# Patient Record
Sex: Female | Born: 1945 | Race: White | Hispanic: No | Marital: Single | Smoking: Former smoker
Health system: Southern US, Community
[De-identification: ages and names within clinical notes are randomized; demographics above are authoritative.]

## PROBLEM LIST (undated history)

## (undated) DIAGNOSIS — T4145XA Adverse effect of unspecified anesthetic, initial encounter: Secondary | ICD-10-CM

## (undated) DIAGNOSIS — K5790 Diverticulosis of intestine, part unspecified, without perforation or abscess without bleeding: Secondary | ICD-10-CM

## (undated) DIAGNOSIS — C541 Malignant neoplasm of endometrium: Secondary | ICD-10-CM

## (undated) DIAGNOSIS — K811 Chronic cholecystitis: Secondary | ICD-10-CM

## (undated) DIAGNOSIS — T8859XA Other complications of anesthesia, initial encounter: Secondary | ICD-10-CM

## (undated) DIAGNOSIS — R112 Nausea with vomiting, unspecified: Secondary | ICD-10-CM

## (undated) DIAGNOSIS — I639 Cerebral infarction, unspecified: Secondary | ICD-10-CM

## (undated) DIAGNOSIS — Z9289 Personal history of other medical treatment: Secondary | ICD-10-CM

## (undated) DIAGNOSIS — Z9889 Other specified postprocedural states: Secondary | ICD-10-CM

## (undated) HISTORY — PX: TUBAL LIGATION: SHX77

## (undated) HISTORY — PX: TONSILLECTOMY: SUR1361

---

## 1969-07-27 DIAGNOSIS — Z9289 Personal history of other medical treatment: Secondary | ICD-10-CM

## 1969-07-27 HISTORY — DX: Personal history of other medical treatment: Z92.89

## 1975-11-27 DIAGNOSIS — C541 Malignant neoplasm of endometrium: Secondary | ICD-10-CM

## 1975-11-27 HISTORY — DX: Malignant neoplasm of endometrium: C54.1

## 1975-11-27 HISTORY — PX: ABDOMINAL HYSTERECTOMY: SHX81

## 2012-02-26 DIAGNOSIS — E559 Vitamin D deficiency, unspecified: Secondary | ICD-10-CM | POA: Diagnosis not present

## 2012-03-17 DIAGNOSIS — Z1331 Encounter for screening for depression: Secondary | ICD-10-CM | POA: Diagnosis not present

## 2012-03-17 DIAGNOSIS — M810 Age-related osteoporosis without current pathological fracture: Secondary | ICD-10-CM | POA: Diagnosis not present

## 2012-03-17 DIAGNOSIS — E559 Vitamin D deficiency, unspecified: Secondary | ICD-10-CM | POA: Diagnosis not present

## 2012-04-10 DIAGNOSIS — L03119 Cellulitis of unspecified part of limb: Secondary | ICD-10-CM | POA: Diagnosis not present

## 2012-04-10 DIAGNOSIS — L02419 Cutaneous abscess of limb, unspecified: Secondary | ICD-10-CM | POA: Diagnosis not present

## 2012-04-16 DIAGNOSIS — L52 Erythema nodosum: Secondary | ICD-10-CM | POA: Diagnosis not present

## 2012-04-16 DIAGNOSIS — M810 Age-related osteoporosis without current pathological fracture: Secondary | ICD-10-CM | POA: Diagnosis not present

## 2012-06-09 DIAGNOSIS — E559 Vitamin D deficiency, unspecified: Secondary | ICD-10-CM | POA: Diagnosis not present

## 2012-07-03 DIAGNOSIS — L819 Disorder of pigmentation, unspecified: Secondary | ICD-10-CM | POA: Diagnosis not present

## 2012-07-21 DIAGNOSIS — R21 Rash and other nonspecific skin eruption: Secondary | ICD-10-CM | POA: Diagnosis not present

## 2012-11-06 DIAGNOSIS — Z0000 Encounter for general adult medical examination without abnormal findings: Secondary | ICD-10-CM | POA: Diagnosis not present

## 2012-11-06 DIAGNOSIS — N951 Menopausal and female climacteric states: Secondary | ICD-10-CM | POA: Diagnosis not present

## 2012-11-06 DIAGNOSIS — M810 Age-related osteoporosis without current pathological fracture: Secondary | ICD-10-CM | POA: Diagnosis not present

## 2012-11-06 DIAGNOSIS — Z1211 Encounter for screening for malignant neoplasm of colon: Secondary | ICD-10-CM | POA: Diagnosis not present

## 2012-11-06 DIAGNOSIS — F17200 Nicotine dependence, unspecified, uncomplicated: Secondary | ICD-10-CM | POA: Diagnosis not present

## 2012-11-07 DIAGNOSIS — Z23 Encounter for immunization: Secondary | ICD-10-CM | POA: Diagnosis not present

## 2012-12-19 DIAGNOSIS — Z1211 Encounter for screening for malignant neoplasm of colon: Secondary | ICD-10-CM | POA: Diagnosis not present

## 2013-02-10 ENCOUNTER — Encounter (HOSPITAL_BASED_OUTPATIENT_CLINIC_OR_DEPARTMENT_OTHER): Payer: Self-pay | Admitting: Family Medicine

## 2013-02-10 ENCOUNTER — Emergency Department (HOSPITAL_BASED_OUTPATIENT_CLINIC_OR_DEPARTMENT_OTHER): Payer: Medicare Other

## 2013-02-10 ENCOUNTER — Emergency Department (HOSPITAL_BASED_OUTPATIENT_CLINIC_OR_DEPARTMENT_OTHER)
Admission: EM | Admit: 2013-02-10 | Discharge: 2013-02-10 | Disposition: A | Discharge status: Home / Self Care | Payer: Medicare Other | Attending: Emergency Medicine | Admitting: Emergency Medicine

## 2013-02-10 DIAGNOSIS — W010XXA Fall on same level from slipping, tripping and stumbling without subsequent striking against object, initial encounter: Secondary | ICD-10-CM | POA: Insufficient documentation

## 2013-02-10 DIAGNOSIS — Y939 Activity, unspecified: Secondary | ICD-10-CM | POA: Insufficient documentation

## 2013-02-10 DIAGNOSIS — F17200 Nicotine dependence, unspecified, uncomplicated: Secondary | ICD-10-CM | POA: Diagnosis not present

## 2013-02-10 DIAGNOSIS — S52509A Unspecified fracture of the lower end of unspecified radius, initial encounter for closed fracture: Secondary | ICD-10-CM | POA: Diagnosis not present

## 2013-02-10 DIAGNOSIS — S52609A Unspecified fracture of lower end of unspecified ulna, initial encounter for closed fracture: Secondary | ICD-10-CM | POA: Diagnosis not present

## 2013-02-10 DIAGNOSIS — S62101A Fracture of unspecified carpal bone, right wrist, initial encounter for closed fracture: Secondary | ICD-10-CM

## 2013-02-10 DIAGNOSIS — Y9289 Other specified places as the place of occurrence of the external cause: Secondary | ICD-10-CM | POA: Insufficient documentation

## 2013-02-10 MED ORDER — HYDROCODONE-ACETAMINOPHEN 5-325 MG PO TABS: 1.0000 | ORAL_TABLET | Freq: Four times a day (QID) | ORAL | Status: DC | PRN

## 2013-02-10 NOTE — ED Provider Notes (Signed)
History     CSN: [id number]  Arrival date & time 02/10/13  1037   First MD Initiated Contact with Patient 02/10/13 1052      Chief Complaint  Patient presents with  . Fall    (Consider location/radiation/quality/duration/timing/severity/associated sxs/prior treatment) Patient is a 67 y.o. female presenting with fall.  Fall   Pt reports she slipped on ice this morning and fell onto her outstretched R hand. Complaining of moderate aching pain to R wrist. Worse with movement. Denies any head injury or LOC.  History reviewed. No pertinent past medical history.  Past Surgical History  Procedure Laterality Date  . Abdominal hysterectomy      No family history on file.  History  Substance Use Topics  . Smoking status: Current Every Day Smoker  . Smokeless tobacco: Not on file  . Alcohol Use: No    OB History   Grav Para Term Preterm Abortions TAB SAB Ect Mult Living                  Review of Systems All other systems reviewed and are negative except as noted in HPI.   Allergies  Penicillins  Home Medications   Current Outpatient Rx  Name  Route  Sig  Dispense  Refill  . ESTROGENS CONJUGATED PO   Oral   Take by mouth.           BP 136/72  Pulse 76  Temp(Src) 97.5 F (36.4 C) (Oral)  Resp 20  SpO2 97%  Physical Exam  Nursing note and vitals reviewed. Constitutional: She is oriented to person, place, and time. She appears well-developed and well-nourished.  HENT:  Head: Normocephalic and atraumatic.  Eyes: EOM are normal. Pupils are equal, round, and reactive to light.  Neck: Normal range of motion. Neck supple.  Cardiovascular: Normal rate, normal heart sounds and intact distal pulses.   Pulmonary/Chest: Effort normal and breath sounds normal.  Abdominal: Bowel sounds are normal. She exhibits no distension. There is no tenderness.  Musculoskeletal: She exhibits edema (R wrist) and tenderness (R wrist).  ROM decreased by pain, no deformity,  neurovascularly intact  Neurological: She is alert and oriented to person, place, and time. She has normal strength. No cranial nerve deficit or sensory deficit.  Skin: Skin is warm and dry. No rash noted.  Psychiatric: She has a normal mood and affect.    ED Course  Procedures (including critical care time)  Labs Reviewed - No data to display Dg Wrist Complete Right  02/10/2013  *RADIOLOGY REPORT*  Clinical Data: History of injury from fall with pain.  RIGHT WRIST - COMPLETE 3+ VIEW  Comparison: None.  Findings: There is a fracture of the base of the ulnar styloid. There is near apposition fracture site.  There is near anatomic alignment.  There is a slightly osteopenic appearance of bones. On the lateral image there appears to be a small fracture through the distal radial metaphysis involving the dorsal aspect.  No dislocation is seen.  There is degenerative spurring at the trapezium - first metacarpal joint.  IMPRESSION: Fracture of the base of the ulnar styloid. Fracture of the distal radial metaphysis.   Original Report Authenticated By: Onalee Hua Call      1. Wrist fracture, closed, right, initial encounter       MDM  Xray as above. Non-displaced. No neurovascular compromise. Will place in wrist splint, refer to Hand. Pain meds as needed.         Charles B.  Bernette Mayers, MD 02/10/13 1144

## 2013-02-10 NOTE — ED Notes (Signed)
Pt sts she fell due to slipping on ice this morning. Pt c/o right wrist pain and swelling, denies hitting head.

## 2013-02-11 NOTE — ED Notes (Signed)
Pt called sts she cannot get appt with ortho until Monday. Trisha Mangle, PA reviewed pt's chart and sts Monday is acceptable and continue ice, elevation and splint until then.

## 2013-02-16 DIAGNOSIS — S52509A Unspecified fracture of the lower end of unspecified radius, initial encounter for closed fracture: Secondary | ICD-10-CM | POA: Diagnosis not present

## 2013-02-20 DIAGNOSIS — S52599A Other fractures of lower end of unspecified radius, initial encounter for closed fracture: Secondary | ICD-10-CM | POA: Diagnosis not present

## 2013-03-17 DIAGNOSIS — IMO0001 Reserved for inherently not codable concepts without codable children: Secondary | ICD-10-CM | POA: Diagnosis not present

## 2013-04-13 DIAGNOSIS — S5290XD Unspecified fracture of unspecified forearm, subsequent encounter for closed fracture with routine healing: Secondary | ICD-10-CM | POA: Diagnosis not present

## 2013-04-13 DIAGNOSIS — S52599A Other fractures of lower end of unspecified radius, initial encounter for closed fracture: Secondary | ICD-10-CM | POA: Diagnosis not present

## 2013-05-11 DIAGNOSIS — IMO0001 Reserved for inherently not codable concepts without codable children: Secondary | ICD-10-CM | POA: Diagnosis not present

## 2013-05-11 DIAGNOSIS — S52599A Other fractures of lower end of unspecified radius, initial encounter for closed fracture: Secondary | ICD-10-CM | POA: Diagnosis not present

## 2013-05-11 DIAGNOSIS — S5290XD Unspecified fracture of unspecified forearm, subsequent encounter for closed fracture with routine healing: Secondary | ICD-10-CM | POA: Diagnosis not present

## 2013-06-04 DIAGNOSIS — R3915 Urgency of urination: Secondary | ICD-10-CM | POA: Diagnosis not present

## 2013-09-05 DIAGNOSIS — Z23 Encounter for immunization: Secondary | ICD-10-CM | POA: Diagnosis not present

## 2013-10-02 DIAGNOSIS — H269 Unspecified cataract: Secondary | ICD-10-CM | POA: Diagnosis not present

## 2013-10-02 DIAGNOSIS — H2510 Age-related nuclear cataract, unspecified eye: Secondary | ICD-10-CM | POA: Diagnosis not present

## 2014-10-06 DIAGNOSIS — H4011X3 Primary open-angle glaucoma, severe stage: Secondary | ICD-10-CM | POA: Diagnosis not present

## 2014-12-06 DIAGNOSIS — Z23 Encounter for immunization: Secondary | ICD-10-CM | POA: Diagnosis not present

## 2015-03-31 DIAGNOSIS — Z23 Encounter for immunization: Secondary | ICD-10-CM | POA: Diagnosis not present

## 2015-03-31 DIAGNOSIS — Z0000 Encounter for general adult medical examination without abnormal findings: Secondary | ICD-10-CM | POA: Diagnosis not present

## 2015-03-31 DIAGNOSIS — N951 Menopausal and female climacteric states: Secondary | ICD-10-CM | POA: Diagnosis not present

## 2015-03-31 DIAGNOSIS — E559 Vitamin D deficiency, unspecified: Secondary | ICD-10-CM | POA: Diagnosis not present

## 2015-03-31 DIAGNOSIS — J309 Allergic rhinitis, unspecified: Secondary | ICD-10-CM | POA: Diagnosis not present

## 2015-04-06 DIAGNOSIS — H4011X3 Primary open-angle glaucoma, severe stage: Secondary | ICD-10-CM | POA: Diagnosis not present

## 2015-04-28 ENCOUNTER — Other Ambulatory Visit: Payer: Self-pay | Admitting: Gastroenterology

## 2015-04-28 DIAGNOSIS — R195 Other fecal abnormalities: Secondary | ICD-10-CM | POA: Diagnosis not present

## 2015-04-28 DIAGNOSIS — R1011 Right upper quadrant pain: Secondary | ICD-10-CM | POA: Diagnosis not present

## 2015-05-11 ENCOUNTER — Ambulatory Visit
Admission: RE | Admit: 2015-05-11 | Discharge: 2015-05-11 | Disposition: A | Discharge status: Home / Self Care | Payer: Medicare Other | Source: Ambulatory Visit | Attending: Gastroenterology | Admitting: Gastroenterology

## 2015-05-11 DIAGNOSIS — K828 Other specified diseases of gallbladder: Secondary | ICD-10-CM | POA: Diagnosis not present

## 2015-05-11 DIAGNOSIS — R1011 Right upper quadrant pain: Secondary | ICD-10-CM

## 2015-05-16 DIAGNOSIS — K648 Other hemorrhoids: Secondary | ICD-10-CM | POA: Diagnosis not present

## 2015-05-16 DIAGNOSIS — K644 Residual hemorrhoidal skin tags: Secondary | ICD-10-CM | POA: Diagnosis not present

## 2015-05-16 DIAGNOSIS — K5730 Diverticulosis of large intestine without perforation or abscess without bleeding: Secondary | ICD-10-CM | POA: Diagnosis not present

## 2015-05-16 DIAGNOSIS — D126 Benign neoplasm of colon, unspecified: Secondary | ICD-10-CM | POA: Diagnosis not present

## 2015-05-16 DIAGNOSIS — D122 Benign neoplasm of ascending colon: Secondary | ICD-10-CM | POA: Diagnosis not present

## 2015-05-16 DIAGNOSIS — R195 Other fecal abnormalities: Secondary | ICD-10-CM | POA: Diagnosis not present

## 2015-05-31 ENCOUNTER — Other Ambulatory Visit: Payer: Self-pay | Admitting: Surgery

## 2015-05-31 DIAGNOSIS — Z720 Tobacco use: Secondary | ICD-10-CM | POA: Diagnosis not present

## 2015-05-31 DIAGNOSIS — K811 Chronic cholecystitis: Secondary | ICD-10-CM | POA: Diagnosis not present

## 2015-05-31 NOTE — H&P (Signed)
[name] 05/31/2015 3:40 PM Location: [facility] Patient #: [id number] DOB: Oct 29, 1946 Divorced / Language: [name] / Race: White Female History of Present Illness [name] MD; 05/31/2015 4:56 PM) Patient words: gallbladder.  The patient is a 69 year old female who presents for evaluation of gall stones. Patient sent for surgical consultation by Dr. Arta Silence with [facility] gastroenterology. Concern for gallbladder symptoms. Pleasant active but smoking female. Has mild indigestion/reflux. Usually controlled with as needed Zantac. Had an episode of very sharp RIGHT upper quadrant abdominal pain. Relocated to her back. Had nausea and vomiting with it. This happened a month ago. She's had milder such symptoms since then. Does not seem consistent with her heartburn or reflux. He cannot recall the food trigger been she did eat an hour to beforehand. Didn't late at night. No sick contacts or travel history. No history of colitis or inflammatory bowel disease. Tells me she had a screening colonoscopy also for some guaiac issues. That just showed diverticulosis. I do not have the records. Because of the more severe attack discussed with her primary care physician. Blood in stool noted. Gastroenterology was concerned for biliary colic. Ultrasound did show sludge. Liver function tests otherwise normal. Surgical consultation requested. She does not drink alcohol. She does smoke about a pack a day. No history of cirrhosis or pancreatitis. No history of stomach ulcers or gastritis. No hematemesis.   CLINICAL DATA: Right upper quadrant abdominal pain EXAM: ULTRASOUND ABDOMEN COMPLETE COMPARISON: None. FINDINGS: Gallbladder: Layering gallbladder sludge. No gallstones or gallbladder wall thickening. Negative sonographic Murphy's sign. Common bile duct: Diameter: 6 mm Liver: No focal lesion identified. Within normal limits in parenchymal echogenicity. IVC: No  abnormality visualized. Pancreas: Visualized portion unremarkable. Spleen: Size and appearance within normal limits. Right Kidney: Length: 11.1 cm. No mass or hydronephrosis. Left Kidney: Length: 11.9 cm. No mass or hydronephrosis. Abdominal aorta: No aneurysm visualized. Other findings: None. IMPRESSION: Layering gallbladder sludge. No associated findings to suggest acute cholecystitis. Electronically Signed By: [name] On: 05/11/2015 08:39  Other Problems [name], [place]; 05/31/2015 3:40 PM) Cervical Cancer Hemorrhoids  Past Surgical History [name], CMA; 05/31/2015 3:40 PM) Colon Polyp Removal - Colonoscopy Hysterectomy (due to cancer) - Partial Tonsillectomy  Diagnostic Studies History [name], CMA; 05/31/2015 3:40 PM) Colonoscopy within last year Mammogram never Pap Smear 1-5 years ago  Allergies [name], CMA; 05/31/2015 3:42 PM) Penicillamine *ASSORTED CLASSES*  Medication History [name], CMA; 05/31/2015 3:43 PM) Estradiol (0.5MG  Tablet, Oral) Active. Fluticasone Propionate (50MCG/ACT Suspension, Nasal) Active. Vitamin D (Cholecalciferol) (1000UNIT Tablet, Oral) Active. Flonase Allergy Relief (50MCG/ACT Suspension, Nasal) Active. Medications Reconciled  Social History [name], CMA; 05/31/2015 3:40 PM) Caffeine use Coffee, Tea. No alcohol use No drug use Tobacco use Current every day smoker.  Family History [name], [place]; 05/31/2015 3:40 PM) Breast Cancer Daughter.  Pregnancy / Birth History [name], [name]; 05/31/2015 3:40 PM) Age at menarche 44 years. Age of menopause <43 Contraceptive History Oral contraceptives. Gravida 4 Maternal age 61-25 Para 3     Review of Systems ([place]; 05/31/2015 3:40 PM) General Not Present- Appetite Loss, Chills, Fatigue, Fever, Night Sweats, Weight Gain and Weight Loss. Skin Not Present- Change in Wart/Mole, Dryness, Hives, Jaundice, New  Lesions, Non-Healing Wounds, Rash and Ulcer. HEENT Present- Seasonal Allergies. Not Present- Earache, Hearing Loss, Hoarseness, Nose Bleed, Oral Ulcers, Ringing in the Ears, Sinus Pain, Sore Throat, Visual Disturbances, Wears glasses/contact lenses and Yellow Eyes. Respiratory Not Present- Bloody sputum, Chronic  Cough, Difficulty Breathing, Snoring and Wheezing. Breast Not Present- Breast Mass, Breast Pain, Nipple Discharge and Skin Changes. Cardiovascular Not Present- Chest Pain, Difficulty Breathing Lying Down, Leg Cramps, Palpitations, Rapid Heart Rate, Shortness of Breath and Swelling of Extremities. Gastrointestinal Present- Gets full quickly at meals. Not Present- Abdominal Pain, Bloating, Bloody Stool, Change in Bowel Habits, Chronic diarrhea, Constipation, Difficulty Swallowing, Excessive gas, Hemorrhoids, Indigestion, Nausea, Rectal Pain and Vomiting. Female Genitourinary Not Present- Frequency, Nocturia, Painful Urination, Pelvic Pain and Urgency. Musculoskeletal Not Present- Back Pain, Joint Pain, Joint Stiffness, Muscle Pain, Muscle Weakness and Swelling of Extremities. Neurological Not Present- Decreased Memory, Fainting, Headaches, Numbness, Seizures, Tingling, Tremor, Trouble walking and Weakness. Psychiatric Not Present- Anxiety, Bipolar, Change in Sleep Pattern, Depression, Fearful and Frequent crying. Endocrine Present- Hot flashes. Not Present- Cold Intolerance, Excessive Hunger, Hair Changes, Heat Intolerance and New Diabetes. Hematology Not Present- Easy Bruising, Excessive bleeding, Gland problems, HIV and Persistent Infections.  Vitals (Sonya Randall CMA; 05/31/2015 3:41 PM) 05/31/2015 3:40 PM Weight: 142.8 lb Height: 62in Body Surface Area: 1.68 m Body Mass Index: 26.12 kg/m Temp.: 97.40F(Temporal)  Pulse: 78 (Regular)  BP: 130/70 (Sitting, Left Arm, Standard)     Physical Exam [name] MD; 05/31/2015 4:39 PM)  General Mental Status-Alert. General  Appearance-Not in acute distress, Not Sickly. Orientation-Oriented X3. Hydration-Well hydrated. Voice-Normal.  Integumentary Global Assessment Upon inspection and palpation of skin surfaces of the - Axillae: non-tender, no inflammation or ulceration, no drainage. and Distribution of scalp and body hair is normal. General Characteristics Temperature - normal warmth is noted.  Head and Neck Head-normocephalic, atraumatic with no lesions or palpable masses. Face Global Assessment - atraumatic, no absence of expression. Neck Global Assessment - no abnormal movements, no bruit auscultated on the right, no bruit auscultated on the left, no decreased range of motion, non-tender. Trachea-midline. Thyroid Gland Characteristics - non-tender.  Eye Eyeball - Left-Extraocular movements intact, No Nystagmus. Eyeball - Right-Extraocular movements intact, No Nystagmus. Cornea - Left-No Hazy. Cornea - Right-No Hazy. Sclera/Conjunctiva - Left-No scleral icterus, No Discharge. Sclera/Conjunctiva - Right-No scleral icterus, No Discharge. Pupil - Left-Direct reaction to light normal. Pupil - Right-Direct reaction to light normal.  ENMT Ears Pinna - Left - no drainage observed, no generalized tenderness observed. Right - no drainage observed, no generalized tenderness observed. Nose and Sinuses External Inspection of the Nose - no destructive lesion observed. Inspection of the nares - Left - quiet respiration. Right - quiet respiration. Mouth and Throat Lips - Upper Lip - no fissures observed, no pallor noted. Lower Lip - no fissures observed, no pallor noted. Nasopharynx - no discharge present. Oral Cavity/Oropharynx - Tongue - no dryness observed. Oral Mucosa - no cyanosis observed. Hypopharynx - no evidence of airway distress observed.  Chest and Lung Exam Inspection Movements - Normal and Symmetrical. Accessory muscles - No use of accessory muscles in  breathing. Palpation Palpation of the chest reveals - Non-tender. Auscultation Breath sounds - Normal and Clear.  Cardiovascular Auscultation Rhythm - Regular. Murmurs & Other Heart Sounds - Auscultation of the heart reveals - No Murmurs and No Systolic Clicks.  Abdomen Inspection Inspection of the abdomen reveals - No Visible peristalsis and No Abnormal pulsations. Umbilicus - No Bleeding, No Urine drainage. Palpation/Percussion Palpation and Percussion of the abdomen reveal - Soft, Non Tender, No Rebound tenderness, No Rigidity (guarding) and No Cutaneous hyperesthesia. Note: Abdomen soft. Mild discomfort in RIGHT upper quadrant but no true Murphy sign.   Female Genitourinary Sexual Maturity Tanner 5 -  Adult hair pattern. Note: No vaginal bleeding nor discharge   Peripheral Vascular Upper Extremity Inspection - Left - No Cyanotic nailbeds, Not Ischemic. Right - No Cyanotic nailbeds, Not Ischemic.  Neurologic Neurologic evaluation reveals -normal attention span and ability to concentrate, able to name objects and repeat phrases. Appropriate fund of knowledge , normal sensation and normal coordination. Mental Status Affect - not angry, not paranoid. Cranial Nerves-Normal Bilaterally. Gait-Normal.  Neuropsychiatric Mental status exam performed with findings of-able to articulate well with normal speech/language, rate, volume and coherence, thought content normal with ability to perform basic computations and apply abstract reasoning and no evidence of hallucinations, delusions, obsessions or homicidal/suicidal ideation.  Musculoskeletal Global Assessment Spine, Ribs and Pelvis - no instability, subluxation or laxity. Right Upper Extremity - no instability, subluxation or laxity.  Lymphatic Head & Neck  General Head & Neck Lymphatics: Bilateral - Description - No Localized lymphadenopathy. Axillary  General Axillary Region: Bilateral - Description - No  Localized lymphadenopathy. Femoral & Inguinal  Generalized Femoral & Inguinal Lymphatics: Left - Description - No Localized lymphadenopathy. Right - Description - No Localized lymphadenopathy.    Assessment & Plan [name] MD; 05/31/2015 4:54 PM)  CHRONIC CHOLECYSTITIS (575.11  K81.1) Impression: Postprandial RIGHT upper quadrant radiating to the back pain with nausea and vomiting strongly suspicious for biliary colic. Ultrasound with at least sludge. Rest of the differential diagnosis unlikely.   She would benefit from cholecystectomy. Reasonable laparoscopic single site approach. He has a risk that this does not solve her problems is incision and etiology. She wishes to proceed with surgery first.  Current Plans Schedule for Surgery Written instructions provided Pt Education - Pamphlet Given - Laparoscopic Gallbladder Surgery: discussed with patient and provided information. The anatomy & physiology of hepatobiliary & pancreatic function was discussed. The pathophysiology of gallbladder dysfunction was discussed. Natural history risks without surgery was discussed. I feel the risks of no intervention will lead to serious problems that outweigh the operative risks; therefore, I recommended cholecystectomy to remove the pathology. I explained laparoscopic techniques with possible need for an open approach. Probable cholangiogram to evaluate the bilary tract was explained as well.  Risks such as bleeding, infection, abscess, leak, injury to other organs, need for further treatment, heart attack, death, and other risks were discussed. I noted a good likelihood this will help address the problem. Possibility that this will not correct all abdominal symptoms was explained. Goals of post-operative recovery were discussed as well. We will work to minimize complications. An educational handout further explaining the pathology and treatment options was given as well. Questions were answered. The  patient expresses understanding & wishes to proceed with surgery. Pt Education - [facility] Laparosopic Post Op HCI (Genevive Printup) Pt Education - [facility] Good Bowel Health (Kayleen Alig) Pt Education - [facility] Pain Control (Anadalay Macdonell) TOBACCO ABUSE (305.1  Z72.0) Impression: STOP SMOKING!!  STOP SMOKING! We talked to the patient about the dangers of smoking. We stressed that tobacco use dramatically increases the risk of peri-operative complications such as infection, tissue necrosis leaving to problems with incision/wound and organ healing, hernia, chronic pain, heart attack, stroke, DVT, pulmonary embolism, and death. We noted there are programs in our community to help stop smoking. Information was available.  Current Plans Pt Education - [facility] Free Text Education/Instructions: discussed with patient and provided information.  [name], M.D., F.A.C.S. Gastrointestinal and Minimally Invasive Surgery [facility] [address] [place], [place] [address] [phone] Main / Paging

## 2015-07-13 NOTE — Patient Instructions (Addendum)
YOUR PROCEDURE IS SCHEDULED ON : 07/19/15  REPORT TO [facility] MAIN ENTRANCE FOLLOW SIGNS TO EAST ELEVATOR - GO TO 3rd FLOOR CHECK IN AT 3 EAST NURSES STATION (SHORT STAY) AT: 7:30 AM  CALL THIS NUMBER IF YOU HAVE PROBLEMS THE MORNING OF SURGERY [phone]  REMEMBER:ONLY 1 PER PERSON MAY GO TO SHORT STAY WITH YOU TO GET READY THE MORNING OF YOUR SURGERY  DO NOT EAT FOOD OR DRINK LIQUIDS AFTER MIDNIGHT  TAKE THESE MEDICINES THE MORNING OF SURGERY: none  STOP ASPIRIN / IBUPROFEN / ALEVE / VITAMINS / HERBAL MEDS __5__ DAYS BEFORE SURGERY  YOU MAY NOT HAVE ANY METAL ON YOUR BODY INCLUDING HAIR PINS AND PIERCING'S. DO NOT WEAR JEWELRY, MAKEUP, LOTIONS, POWDERS OR PERFUMES. DO NOT WEAR NAIL POLISH. DO NOT SHAVE 48 HRS PRIOR TO SURGERY. MEN MAY SHAVE FACE AND NECK.  DO NOT [place]. [facility] IS NOT RESPONSIBLE FOR VALUABLES.  CONTACTS, DENTURES OR PARTIALS MAY NOT BE WORN TO SURGERY. LEAVE SUITCASE IN CAR. CAN BE BROUGHT TO ROOM AFTER SURGERY.  PATIENTS DISCHARGED THE DAY OF SURGERY WILL NOT BE ALLOWED TO DRIVE HOME.  PLEASE READ OVER THE FOLLOWING INSTRUCTION SHEETS _________________________________________________________________________________                                          [facility] - PREPARING FOR SURGERY  Before surgery, you can play an important role.  Because skin is not sterile, your skin needs to be as free of germs as possible.  You can reduce the number of germs on your skin by washing with CHG (chlorahexidine gluconate) soap before surgery.  CHG is an antiseptic cleaner which kills germs and bonds with the skin to continue killing germs even after washing. Please DO NOT use if you have an allergy to CHG or antibacterial soaps.  If your skin becomes reddened/irritated stop using the CHG and inform your nurse when you arrive at Short Stay. Do not shave (including legs and underarms) for at least 48 hours prior to the first  CHG shower.  You may shave your face. Please follow these instructions carefully:   1.  Shower with CHG Soap the night before surgery and the  morning of Surgery.   2.  If you choose to wash your hair, wash your hair first as usual with your  normal  Shampoo.   3.  After you shampoo, rinse your hair and body thoroughly to remove the  shampoo.                                         4.  Use CHG as you would any other liquid soap.  You can apply chg directly  to the skin and wash . Gently wash with scrungie or clean wascloth    5.  Apply the CHG Soap to your body ONLY FROM THE NECK DOWN.   Do not use on open                           Wound or open sores. Avoid contact with eyes, ears mouth and genitals (private parts).  Genitals (private parts) with your normal soap.              6.  Wash thoroughly, paying special attention to the area where your surgery  will be performed.   7.  Thoroughly rinse your body with warm water from the neck down.   8.  DO NOT shower/wash with your normal soap after using and rinsing off  the CHG Soap .                9.  Pat yourself dry with a clean towel.             10.  Wear clean night clothes to bed after shower             11.  Place clean sheets on your bed the night of your first shower and do not  sleep with pets.  Day of Surgery : Do not apply any lotions/deodorants the morning of surgery.  Please wear clean clothes to the hospital/surgery center.  FAILURE TO FOLLOW THESE INSTRUCTIONS MAY RESULT IN THE CANCELLATION OF YOUR SURGERY    PATIENT SIGNATURE_________________________________  ______________________________________________________________________

## 2015-07-14 ENCOUNTER — Encounter (HOSPITAL_COMMUNITY): Payer: Self-pay

## 2015-07-14 ENCOUNTER — Encounter (HOSPITAL_COMMUNITY)
Admission: RE | Admit: 2015-07-14 | Discharge: 2015-07-14 | Disposition: A | Discharge status: Home / Self Care | Payer: Medicare Other | Source: Ambulatory Visit | Attending: Surgery | Admitting: Surgery

## 2015-07-14 DIAGNOSIS — Z01818 Encounter for other preprocedural examination: Secondary | ICD-10-CM | POA: Insufficient documentation

## 2015-07-14 DIAGNOSIS — K8050 Calculus of bile duct without cholangitis or cholecystitis without obstruction: Secondary | ICD-10-CM | POA: Diagnosis not present

## 2015-07-14 HISTORY — DX: Adverse effect of unspecified anesthetic, initial encounter: T41.45XA

## 2015-07-14 HISTORY — DX: Nausea with vomiting, unspecified: R11.2

## 2015-07-14 HISTORY — DX: Other specified postprocedural states: Z98.890

## 2015-07-14 HISTORY — DX: Chronic cholecystitis: K81.1

## 2015-07-14 HISTORY — DX: Personal history of other medical treatment: Z92.89

## 2015-07-14 HISTORY — DX: Other complications of anesthesia, initial encounter: T88.59XA

## 2015-07-14 LAB — CBC
HEMATOCRIT: 38 % (ref 36.0–46.0)
Hemoglobin: 12.6 g/dL (ref 12.0–15.0)
MCH: 30.1 pg (ref 26.0–34.0)
MCHC: 33.2 g/dL (ref 30.0–36.0)
MCV: 90.7 fL (ref 78.0–100.0)
PLATELETS: 244 10*3/uL (ref 150–400)
RBC: 4.19 MIL/uL (ref 3.87–5.11)
RDW: 13.1 % (ref 11.5–15.5)
WBC: 5.5 10*3/uL (ref 4.0–10.5)

## 2015-07-19 ENCOUNTER — Ambulatory Visit (HOSPITAL_COMMUNITY): Payer: Medicare Other | Admitting: Anesthesiology

## 2015-07-19 ENCOUNTER — Ambulatory Visit (HOSPITAL_COMMUNITY)
Admission: RE | Admit: 2015-07-19 | Discharge: 2015-07-19 | Disposition: A | Discharge status: Home / Self Care | Payer: Medicare Other | Source: Ambulatory Visit | Attending: Surgery | Admitting: Surgery

## 2015-07-19 ENCOUNTER — Encounter (HOSPITAL_COMMUNITY): Payer: Self-pay | Admitting: *Deleted

## 2015-07-19 ENCOUNTER — Encounter (HOSPITAL_COMMUNITY)
Admission: RE | Disposition: A | Discharge status: Home / Self Care | Payer: Self-pay | Source: Ambulatory Visit | Attending: Surgery

## 2015-07-19 ENCOUNTER — Ambulatory Visit (HOSPITAL_COMMUNITY): Payer: Medicare Other

## 2015-07-19 DIAGNOSIS — F17200 Nicotine dependence, unspecified, uncomplicated: Secondary | ICD-10-CM | POA: Diagnosis not present

## 2015-07-19 DIAGNOSIS — K811 Chronic cholecystitis: Secondary | ICD-10-CM | POA: Diagnosis not present

## 2015-07-19 DIAGNOSIS — Z7951 Long term (current) use of inhaled steroids: Secondary | ICD-10-CM | POA: Insufficient documentation

## 2015-07-19 DIAGNOSIS — K921 Melena: Secondary | ICD-10-CM | POA: Insufficient documentation

## 2015-07-19 DIAGNOSIS — N951 Menopausal and female climacteric states: Secondary | ICD-10-CM | POA: Diagnosis not present

## 2015-07-19 DIAGNOSIS — C539 Malignant neoplasm of cervix uteri, unspecified: Secondary | ICD-10-CM | POA: Insufficient documentation

## 2015-07-19 DIAGNOSIS — Z79899 Other long term (current) drug therapy: Secondary | ICD-10-CM | POA: Insufficient documentation

## 2015-07-19 DIAGNOSIS — K819 Cholecystitis, unspecified: Secondary | ICD-10-CM | POA: Diagnosis not present

## 2015-07-19 DIAGNOSIS — Z79890 Hormone replacement therapy (postmenopausal): Secondary | ICD-10-CM | POA: Diagnosis not present

## 2015-07-19 DIAGNOSIS — K8050 Calculus of bile duct without cholangitis or cholecystitis without obstruction: Secondary | ICD-10-CM | POA: Diagnosis not present

## 2015-07-19 DIAGNOSIS — Z419 Encounter for procedure for purposes other than remedying health state, unspecified: Secondary | ICD-10-CM

## 2015-07-19 DIAGNOSIS — R112 Nausea with vomiting, unspecified: Secondary | ICD-10-CM | POA: Diagnosis present

## 2015-07-19 HISTORY — PX: LAPAROSCOPIC CHOLECYSTECTOMY SINGLE SITE WITH INTRAOPERATIVE CHOLANGIOGRAM: SHX6538

## 2015-07-19 HISTORY — DX: Malignant neoplasm of endometrium: C54.1

## 2015-07-19 SURGERY — LAPAROSCOPIC CHOLECYSTECTOMY SINGLE SITE WITH INTRAOPERATIVE CHOLANGIOGRAM
Anesthesia: General | Site: Abdomen

## 2015-07-19 MED ORDER — KETOROLAC TROMETHAMINE 30 MG/ML IJ SOLN
INTRAMUSCULAR | Status: AC
  Filled 2015-07-19: qty 1

## 2015-07-19 MED ORDER — PROPOFOL 10 MG/ML IV BOLUS
INTRAVENOUS | Status: AC
  Filled 2015-07-19: qty 20

## 2015-07-19 MED ORDER — MIDAZOLAM HCL 5 MG/5ML IJ SOLN
INTRAMUSCULAR | Status: DC | PRN
Start: 2015-07-19 — End: 2015-07-19
  Administered 2015-07-19: 2 mg via INTRAVENOUS

## 2015-07-19 MED ORDER — FENTANYL CITRATE (PF) 100 MCG/2ML IJ SOLN
25.0000 ug | INTRAMUSCULAR | Status: DC | PRN
  Administered 2015-07-19 (×2): 50 ug via INTRAVENOUS

## 2015-07-19 MED ORDER — NEOSTIGMINE METHYLSULFATE 10 MG/10ML IV SOLN
INTRAVENOUS | Status: DC | PRN
  Administered 2015-07-19: 4 mg via INTRAVENOUS

## 2015-07-19 MED ORDER — SUCCINYLCHOLINE CHLORIDE 20 MG/ML IJ SOLN
INTRAMUSCULAR | Status: DC | PRN
  Administered 2015-07-19: 100 mg via INTRAVENOUS

## 2015-07-19 MED ORDER — LACTATED RINGERS IR SOLN
Status: DC | PRN
  Administered 2015-07-19: 2000 mL

## 2015-07-19 MED ORDER — IOHEXOL 300 MG/ML  SOLN
INTRAMUSCULAR | Status: DC | PRN
  Administered 2015-07-19: 12 mL

## 2015-07-19 MED ORDER — LIDOCAINE HCL (CARDIAC) 20 MG/ML IV SOLN
INTRAVENOUS | Status: AC
  Filled 2015-07-19: qty 5

## 2015-07-19 MED ORDER — GLYCOPYRROLATE 0.2 MG/ML IJ SOLN
INTRAMUSCULAR | Status: AC
  Filled 2015-07-19: qty 3

## 2015-07-19 MED ORDER — BUPIVACAINE-EPINEPHRINE 0.25% -1:200000 IJ SOLN
INTRAMUSCULAR | Status: DC | PRN
  Administered 2015-07-19: 50 mL

## 2015-07-19 MED ORDER — PROPOFOL 10 MG/ML IV BOLUS
INTRAVENOUS | Status: DC | PRN
  Administered 2015-07-19: 100 mg via INTRAVENOUS

## 2015-07-19 MED ORDER — ONDANSETRON HCL 4 MG/2ML IJ SOLN
INTRAMUSCULAR | Status: DC | PRN
  Administered 2015-07-19: 4 mg via INTRAVENOUS

## 2015-07-19 MED ORDER — FENTANYL CITRATE (PF) 250 MCG/5ML IJ SOLN
INTRAMUSCULAR | Status: AC
  Filled 2015-07-19: qty 25

## 2015-07-19 MED ORDER — CHLORHEXIDINE GLUCONATE 4 % EX LIQD: 1.0000 "application " | Freq: Once | CUTANEOUS | Status: DC

## 2015-07-19 MED ORDER — HYDROMORPHONE HCL 1 MG/ML IJ SOLN
0.2500 mg | INTRAMUSCULAR | Status: DC | PRN
  Administered 2015-07-19: 0.5 mg via INTRAVENOUS

## 2015-07-19 MED ORDER — BUPIVACAINE-EPINEPHRINE 0.25% -1:200000 IJ SOLN
INTRAMUSCULAR | Status: AC
  Filled 2015-07-19: qty 1

## 2015-07-19 MED ORDER — KETOROLAC TROMETHAMINE 30 MG/ML IJ SOLN
INTRAMUSCULAR | Status: DC | PRN
  Administered 2015-07-19: 30 mg via INTRAVENOUS

## 2015-07-19 MED ORDER — BUPIVACAINE-EPINEPHRINE (PF) 0.25% -1:200000 IJ SOLN
INTRAMUSCULAR | Status: AC
  Filled 2015-07-19: qty 30

## 2015-07-19 MED ORDER — HYDROMORPHONE HCL 1 MG/ML IJ SOLN
INTRAMUSCULAR | Status: AC
  Filled 2015-07-19: qty 1

## 2015-07-19 MED ORDER — ROCURONIUM BROMIDE 100 MG/10ML IV SOLN
INTRAVENOUS | Status: AC
  Filled 2015-07-19: qty 1

## 2015-07-19 MED ORDER — LIDOCAINE HCL (CARDIAC) 20 MG/ML IV SOLN
INTRAVENOUS | Status: DC | PRN
Start: 2015-07-19 — End: 2015-07-19
  Administered 2015-07-19: 50 mg via INTRAVENOUS

## 2015-07-19 MED ORDER — FENTANYL CITRATE (PF) 100 MCG/2ML IJ SOLN
INTRAMUSCULAR | Status: AC
  Filled 2015-07-19: qty 2

## 2015-07-19 MED ORDER — GLYCOPYRROLATE 0.2 MG/ML IJ SOLN
INTRAMUSCULAR | Status: DC | PRN
  Administered 2015-07-19: .6 mg via INTRAVENOUS

## 2015-07-19 MED ORDER — ONDANSETRON HCL 4 MG/2ML IJ SOLN: 4.0000 mg | Freq: Once | INTRAMUSCULAR | Status: DC | PRN

## 2015-07-19 MED ORDER — OXYCODONE HCL 5 MG PO TABS: 5.0000 mg | ORAL_TABLET | ORAL | Status: DC | PRN

## 2015-07-19 MED ORDER — OXYCODONE HCL 5 MG PO TABS
5.0000 mg | ORAL_TABLET | ORAL | Status: DC | PRN
  Administered 2015-07-19: 5 mg via ORAL
  Filled 2015-07-19: qty 1

## 2015-07-19 MED ORDER — ROCURONIUM BROMIDE 100 MG/10ML IV SOLN
INTRAVENOUS | Status: DC | PRN
  Administered 2015-07-19: 20 mg via INTRAVENOUS

## 2015-07-19 MED ORDER — MIDAZOLAM HCL 2 MG/2ML IJ SOLN
INTRAMUSCULAR | Status: AC
  Filled 2015-07-19: qty 2

## 2015-07-19 MED ORDER — FENTANYL CITRATE (PF) 100 MCG/2ML IJ SOLN
INTRAMUSCULAR | Status: DC | PRN
  Administered 2015-07-19: 50 ug via INTRAVENOUS

## 2015-07-19 MED ORDER — NEOSTIGMINE METHYLSULFATE 10 MG/10ML IV SOLN
INTRAVENOUS | Status: AC
Start: 2015-07-19 — End: 2015-07-19
  Filled 2015-07-19: qty 1

## 2015-07-19 MED ORDER — LACTATED RINGERS IV SOLN
INTRAVENOUS | Status: DC
  Administered 2015-07-19: 10:00:00 via INTRAVENOUS
  Administered 2015-07-19: 1000 mL via INTRAVENOUS

## 2015-07-19 MED ORDER — MIDAZOLAM HCL 2 MG/2ML IJ SOLN
INTRAMUSCULAR | Status: AC
  Filled 2015-07-19: qty 4

## 2015-07-19 SURGICAL SUPPLY — 37 items
APPLIER CLIP 5 13 M/L LIGAMAX5 (MISCELLANEOUS) ×3
CABLE HIGH FREQUENCY MONO STRZ (ELECTRODE) ×3 IMPLANT
CHLORAPREP W/TINT 26ML (MISCELLANEOUS) ×3 IMPLANT
CLIP APPLIE 5 13 M/L LIGAMAX5 (MISCELLANEOUS) ×1 IMPLANT
COVER MAYO STAND STRL (DRAPES) ×3 IMPLANT
COVER SURGICAL LIGHT HANDLE (MISCELLANEOUS) ×3 IMPLANT
DECANTER SPIKE VIAL GLASS SM (MISCELLANEOUS) ×3 IMPLANT
DRAIN CHANNEL 19F RND (DRAIN) IMPLANT
DRAPE C-ARM 42X120 X-RAY (DRAPES) ×3 IMPLANT
DRAPE LAPAROSCOPIC ABDOMINAL (DRAPES) ×3 IMPLANT
DRAPE WARM FLUID 44X44 (DRAPE) ×3 IMPLANT
DRSG TEGADERM 4X4.75 (GAUZE/BANDAGES/DRESSINGS) ×3 IMPLANT
ELECT REM PT RETURN 9FT ADLT (ELECTROSURGICAL) ×3
ELECTRODE REM PT RTRN 9FT ADLT (ELECTROSURGICAL) ×1 IMPLANT
ENDOLOOP SUT PDS II  0 18 (SUTURE)
ENDOLOOP SUT PDS II 0 18 (SUTURE) IMPLANT
EVACUATOR SILICONE 100CC (DRAIN) IMPLANT
GAUZE SPONGE 2X2 8PLY STRL LF (GAUZE/BANDAGES/DRESSINGS) ×1 IMPLANT
GLOVE ECLIPSE 8.0 STRL XLNG CF (GLOVE) ×3 IMPLANT
GLOVE INDICATOR 8.0 STRL GRN (GLOVE) ×3 IMPLANT
GOWN STRL REUS W/TWL XL LVL3 (GOWN DISPOSABLE) ×6 IMPLANT
KIT BASIN OR (CUSTOM PROCEDURE TRAY) ×3 IMPLANT
PEN SKIN MARKING BROAD (MISCELLANEOUS) ×3 IMPLANT
POUCH SPECIMEN RETRIEVAL 10MM (ENDOMECHANICALS) ×3 IMPLANT
SCISSORS LAP 5X35 DISP (ENDOMECHANICALS) IMPLANT
SET CHOLANGIOGRAPH MIX (MISCELLANEOUS) ×3 IMPLANT
SET IRRIG TUBING LAPAROSCOPIC (IRRIGATION / IRRIGATOR) ×3 IMPLANT
SHEARS HARMONIC ACE PLUS 36CM (ENDOMECHANICALS) ×3 IMPLANT
SPONGE GAUZE 2X2 STER 10/PKG (GAUZE/BANDAGES/DRESSINGS) ×2
SUT MNCRL AB 4-0 PS2 18 (SUTURE) ×3 IMPLANT
SUT PDS AB 1 CT1 27 (SUTURE) ×6 IMPLANT
SYR 20CC LL (SYRINGE) ×3 IMPLANT
TOWEL OR 17X26 10 PK STRL BLUE (TOWEL DISPOSABLE) ×3 IMPLANT
TOWEL OR NON WOVEN STRL DISP B (DISPOSABLE) IMPLANT
TRAY LAPAROSCOPIC (CUSTOM PROCEDURE TRAY) ×3 IMPLANT
TROCAR BLADELESS OPT 5 100 (ENDOMECHANICALS) ×3 IMPLANT
TROCAR BLADELESS OPT 5 150 (ENDOMECHANICALS) ×3 IMPLANT

## 2015-07-19 NOTE — Anesthesia Preprocedure Evaluation (Signed)
Anesthesia Evaluation  Patient identified by MRN, date of birth, ID band Patient awake    Reviewed: Allergy & Precautions, NPO status , Patient's Chart, lab work & pertinent test results  History of Anesthesia Complications (+) PONV and history of anesthetic complications  Airway Mallampati: II  TM Distance: >3 FB Neck ROM: Full    Dental  (+) Lower Dentures, Upper Dentures, Dental Advisory Given   Pulmonary Current Smoker,  breath sounds clear to auscultation  Pulmonary exam normal       Cardiovascular Exercise Tolerance: Good - angina- Past MI negative cardio ROS Normal cardiovascular examRhythm:Regular Rate:Normal     Neuro/Psych negative neurological ROS     GI/Hepatic negative GI ROS, Neg liver ROS,   Endo/Other  negative endocrine ROS  Renal/GU negative Renal ROS     Musculoskeletal negative musculoskeletal ROS (+)   Abdominal   Peds  Hematology negative hematology ROS (+)   Anesthesia Other Findings Day of surgery medications reviewed with the patient.  Reproductive/Obstetrics                             Anesthesia Physical Anesthesia Plan  ASA: II  Anesthesia Plan: General   Post-op Pain Management:    Induction: Intravenous  Airway Management Planned: Oral ETT  Additional Equipment:   Intra-op Plan:   Post-operative Plan: Extubation in OR  Informed Consent: I have reviewed the patients History and Physical, chart, labs and discussed the procedure including the risks, benefits and alternatives for the proposed anesthesia with the patient or authorized representative who has indicated his/her understanding and acceptance.   Dental advisory given  Plan Discussed with: CRNA  Anesthesia Plan Comments: (Risks/benefits of general anesthesia discussed with patient including risk of damage to teeth, lips, gum, and tongue, nausea/vomiting, allergic reactions to  medications, and the possibility of heart attack, stroke and death.  All patient questions answered.  Patient wishes to proceed.)        Anesthesia Quick Evaluation

## 2015-07-19 NOTE — Anesthesia Procedure Notes (Signed)
Procedure Name: Intubation Date/Time: 07/19/2015 10:05 AM Performed by: Dimas Millin, Ariyanah Aguado F Pre-anesthesia Checklist: Patient identified, Emergency Drugs available, Suction available, Patient being monitored and Timeout performed Patient Re-evaluated:Patient Re-evaluated prior to inductionOxygen Delivery Method: Circle system utilized Preoxygenation: Pre-oxygenation with 100% oxygen Intubation Type: IV induction Laryngoscope Size: Mac and 4 Grade View: Grade I Tube type: Oral Tube size: 7.0 mm Number of attempts: 1 Airway Equipment and Method: Stylet Placement Confirmation: ETT inserted through vocal cords under direct vision,  positive ETCO2 and breath sounds checked- equal and bilateral Secured at: 23 cm Tube secured with: Tape Dental Injury: Teeth and Oropharynx as per pre-operative assessment  Comments: Intubated by paramedic student.

## 2015-07-19 NOTE — Discharge Instructions (Signed)
LAPAROSCOPIC SURGERY: POST OP INSTRUCTIONS ° °1. DIET: Follow a light bland diet the first 24 hours after arrival home, such as soup, liquids, crackers, etc.  Be sure to include lots of fluids daily.  Avoid fast food or heavy meals as your are more likely to get nauseated.  Eat a low fat the next few days after surgery.   °2. Take your usually prescribed home medications unless otherwise directed. °3. PAIN CONTROL: °a. Pain is best controlled by a usual combination of three different methods TOGETHER: °i. Ice/Heat °ii. Over the counter pain medication °iii. Prescription pain medication °b. Most patients will experience some swelling and bruising around the incisions.  Ice packs or heating pads (30-60 minutes up to 6 times a day) will help. Use ice for the first few days to help decrease swelling and bruising, then switch to heat to help relax tight/sore spots and speed recovery.  Some people prefer to use ice alone, heat alone, alternating between ice & heat.  Experiment to what works for you.  Swelling and bruising can take several weeks to resolve.   °c. It is helpful to take an over-the-counter pain medication regularly for the first few weeks.  Choose one of the following that works best for you: °i. Naproxen (Aleve, etc)  Two 220mg tabs twice a day °ii. Ibuprofen (Advil, etc) Three 200mg tabs four times a day (every meal & bedtime) °iii. Acetaminophen (Tylenol, etc) 500-650mg four times a day (every meal & bedtime) °d. A  prescription for pain medication (such as oxycodone, hydrocodone, etc) should be given to you upon discharge.  Take your pain medication as prescribed.  °i. If you are having problems/concerns with the prescription medicine (does not control pain, nausea, vomiting, rash, itching, etc), please call us [phone] to see if we need to switch you to a different pain medicine that will work better for you and/or control your side effect better. °ii. If you need a refill on your pain medication,  please contact your pharmacy.  They will contact our office to request authorization. Prescriptions will not be filled after 5 pm or on week-ends. °4. Avoid getting constipated.  Between the surgery and the pain medications, it is common to experience some constipation.  Increasing fluid intake and taking a fiber supplement (such as Metamucil, Citrucel, FiberCon, MiraLax, etc) 1-2 times a day regularly will usually help prevent this problem from occurring.  A mild laxative (prune juice, Milk of Magnesia, MiraLax, etc) should be taken according to package directions if there are no bowel movements after 48 hours.   °5. Watch out for diarrhea.  If you have many loose bowel movements, simplify your diet to bland foods & liquids for a few days.  Stop any stool softeners and decrease your fiber supplement.  Switching to mild anti-diarrheal medications (Kayopectate, Pepto Bismol) can help.  If this worsens or does not improve, please call us. °6. Wash / shower every day.  You may shower over the dressings as they are waterproof.  Continue to shower over incision(s) after the dressing is off. °7. Remove your waterproof bandages 5 days after surgery.  You may leave the incision open to air.  You may replace a dressing/Band-Aid to cover the incision for comfort if you wish.  °8. ACTIVITIES as tolerated:   °a. You may resume regular (light) daily activities beginning the next day--such as daily self-care, walking, climbing stairs--gradually increasing activities as tolerated.  If you can walk 30 minutes without difficulty, it   is safe to try more intense activity such as jogging, treadmill, bicycling, low-impact aerobics, swimming, etc. b. Save the most intensive and strenuous activity for last such as sit-ups, heavy lifting, contact sports, etc  Refrain from any heavy lifting or straining until you are off narcotics for pain control.   c. DO NOT PUSH THROUGH PAIN.  Let pain be your guide: If it hurts to do something, don't  do it.  Pain is your body warning you to avoid that activity for another week until the pain goes down. d. You may drive when you are no longer taking prescription pain medication, you can comfortably wear a seatbelt, and you can safely maneuver your car and apply brakes. e. Dennis Bast may have sexual intercourse when it is comfortable.  9. FOLLOW UP in our office a. Please call [facility] at [phone] to set up an appointment to see your surgeon in the office for a follow-up appointment approximately 2-3 weeks after your surgery. b. Make sure that you call for this appointment the day you arrive home to insure a convenient appointment time. 10. IF YOU HAVE DISABILITY OR FAMILY LEAVE FORMS, BRING THEM TO THE OFFICE FOR PROCESSING.  DO NOT GIVE THEM TO YOUR DOCTOR.   WHEN TO CALL us [phone]: 1. Poor pain control 2. Reactions / problems with new medications (rash/itching, nausea, etc)  3. Fever over 101.5 F (38.5 C) 4. Inability to urinate 5. Nausea and/or vomiting 6. Worsening swelling or bruising 7. Continued bleeding from incision. 8. Increased pain, redness, or drainage from the incision   The clinic staff is available to answer your questions during regular business hours (8:30am-5pm).  Please dont hesitate to call and ask to speak to one of our nurses for clinical concerns.   If you have a medical emergency, go to the nearest emergency room or call 911.  A surgeon from [facility] is always on call at the [facility], [place], [place], [place], [place]  [address] ? MAIN: [phone] ? TOLL FREE: [phone] ?  FAX [phone] [email or web address]  Cholecystitis Cholecystitis is an inflammation of your gallbladder. It is usually caused by a buildup of gallstones or sludge (cholelithiasis) in your gallbladder. The gallbladder stores a fluid that helps digest fats (bile). Cholecystitis is serious and needs  treatment right away.  CAUSES   Gallstones. Gallstones can block the tube that leads to your gallbladder, causing bile to build up. As bile builds up, the gallbladder becomes inflamed.  Bile duct problems, such as blockage from scarring or kinking.  Tumors. Tumors can stop bile from leaving your gallbladder correctly, causing bile to build up. As bile builds up, the gallbladder becomes inflamed. SYMPTOMS   Nausea.  Vomiting.  Abdominal pain, especially in the upper right area of your abdomen.  Abdominal tenderness or bloating.  Sweating.  Chills.  Fever.  Yellowing of the skin and the whites of the eyes (jaundice). DIAGNOSIS  Your caregiver may order blood tests to look for infection or gallbladder problems. Your caregiver may also order imaging tests, such as an ultrasound or computed tomography (CT) scan. Further tests may include a hepatobiliary iminodiacetic acid (HIDA) scan. This scan allows your caregiver to see your bile move from the liver to the gallbladder and to the small intestine. TREATMENT  A hospital stay is usually necessary to lessen the inflammation of your gallbladder. You may be required to not eat or drink (fast) for a  certain amount of time. You may be given medicine to treat pain or an antibiotic medicine to treat an infection. Surgery may be needed to remove your gallbladder (cholecystectomy) once the inflammation has gone down. Surgery may be needed right away if you develop complications such as death of gallbladder tissue (gangrene) or a tear (perforation) of the gallbladder.  [place] care will depend on your treatment. In general:  If you were given antibiotics, take them as directed. Finish them even if you start to feel better.  Only take over-the-counter or prescription medicines for pain, discomfort, or fever as directed by your caregiver.  Follow a low-fat diet until you see your caregiver again.  Keep all follow-up visits as  directed by your caregiver. SEEK IMMEDIATE MEDICAL CARE IF:   Your pain is increasing and not controlled by medicines.  Your pain moves to another part of your abdomen or to your back.  You have a fever.  You have nausea and vomiting. MAKE SURE YOU:  Understand these instructions.  Will watch your condition.  Will get help right away if you are not doing well or get worse. Document Released: 11/12/2005 Document Revised: 02/04/2012 Document Reviewed: 09/28/2011 [facility] Patient Information 2015 [place], [place]. This information is not intended to replace advice given to you by your health care provider. Make sure you discuss any questions you have with your health care provider.  Managing Pain  Pain after surgery or related to activity is often due to strain/injury to muscle, tendon, nerves and/or incisions.  This pain is usually short-term and will improve in a few months.   Many people find it helpful to do the following things TOGETHER to help speed the process of healing and to get back to regular activity more quickly:  1. Avoid heavy physical activity at first a. No lifting greater than 20 pounds at first, then increase to lifting as tolerated over the next few weeks b. Do not push through the pain.  Listen to your body and avoid positions and maneuvers than reproduce the pain.  Wait a few days before trying something more intense c. Walking is okay as tolerated, but go slowly and stop when getting sore.  If you can walk 30 minutes without stopping or pain, you can try more intense activity (running, jogging, aerobics, cycling, swimming, treadmill, sex, sports, weightlifting, etc ) d. Remember: If it hurts to do it, then dont do it!  2. Take Anti-inflammatory medication a. Choose ONE of the following over-the-counter medications: i.            Acetaminophen 500mg  tabs (Tylenol) 1-2 pills with every meal and just before bedtime (avoid if you have liver problems) ii.             Naproxen 220mg  tabs (ex. Aleve) 1-2 pills twice a day (avoid if you have kidney, stomach, IBD, or bleeding problems) iii. Ibuprofen 200mg  tabs (ex. Advil, Motrin) 3-4 pills with every meal and just before bedtime (avoid if you have kidney, stomach, IBD, or bleeding problems) b. Take with food/snack around the clock for 1-2 weeks i. This helps the muscle and nerve tissues become less irritable and calm down faster  3. Use a Heating pad or Ice/Cold Pack a. 4-6 times a day b. May use warm bath/hottub  or showers  4. Try Gentle Massage and/or Stretching  a. at the area of pain many times a day b. stop if you feel pain - do not overdo it  Try these steps together to help you body heal faster and avoid making things get worse.  Doing just one of these things may not be enough.    If you are not getting better after two weeks or are noticing you are getting worse, contact our office for further advice; we may need to re-evaluate you & see what other things we can do to help.  GETTING TO GOOD BOWEL HEALTH. Irregular bowel habits such as constipation and diarrhea can lead to many problems over time.  Having one soft bowel movement a day is the most important way to prevent further problems.  The anorectal canal is designed to handle stretching and feces to safely manage our ability to get rid of solid waste (feces, poop, stool) out of our body.  BUT, hard constipated stools can act like ripping concrete bricks and diarrhea can be a burning fire to this very sensitive area of our body, causing inflamed hemorrhoids, anal fissures, increasing risk is perirectal abscesses, abdominal pain/bloating, an making irritable bowel worse.      The goal: ONE SOFT BOWEL MOVEMENT A DAY!  To have soft, regular bowel movements:   Drink plenty of fluids, consider 4-6 tall glasses of water a day.    Take plenty of fiber.  Fiber is the undigested part of plant food that passes into the colon, acting s natures broom to  encourage bowel motility and movement.  Fiber can absorb and hold large amounts of water. This results in a larger, bulkier stool, which is soft and easier to pass. Work gradually over several weeks up to 6 servings a day of fiber (25g a day even more if needed) in the form of: o Vegetables -- Root (potatoes, carrots, turnips), leafy green (lettuce, salad greens, celery, spinach), or cooked high residue (cabbage, broccoli, etc) o Fruit -- Fresh (unpeeled skin & pulp), Dried (prunes, apricots, cherries, etc ),  or stewed ( applesauce)  o Whole grain breads, pasta, etc (whole wheat)  o Bran cereals   Bulking Agents -- This type of water-retaining fiber generally is easily obtained each day by one of the following:  o Psyllium bran -- The psyllium plant is remarkable because its ground seeds can retain so much water. This product is available as Metamucil, Konsyl, Effersyllium, Per Diem Fiber, or the less expensive generic preparation in drug and health food stores. Although labeled a laxative, it really is not a laxative.  o Methylcellulose -- This is another fiber derived from wood which also retains water. It is available as Citrucel. o Polyethylene Glycol - and artificial fiber commonly called Miralax or Glycolax.  It is helpful for people with gassy or bloated feelings with regular fiber o Flax Seed - a less gassy fiber than psyllium  No reading or other relaxing activity while on the toilet. If bowel movements take longer than 5 minutes, you are too constipated  AVOID CONSTIPATION.  High fiber and water intake usually takes care of this.  Sometimes a laxative is needed to stimulate more frequent bowel movements, but   Laxatives are not a good long-term solution as it can wear the colon out.  They can help jump-start bowels if constipated, but should be relied on constantly without discussing with your doctor o Osmotics (Milk of Magnesia, Fleets phosphosoda, Magnesium citrate, MiraLax, GoLytely)  are safer than  o Stimulants (Senokot, Castor Oil, Dulcolax, Ex Lax)    o Avoid taking laxatives for more than 7 days in a row.  IF SEVERELY CONSTIPATED, try a Bowel Retraining Program: o Do not use laxatives.  o Eat a diet high in roughage, such as bran cereals and leafy vegetables.  o Drink six (6) ounces of prune or apricot juice each morning.  o Eat two (2) large servings of stewed fruit each day.  o Take one (1) heaping tablespoon of a psyllium-based bulking agent twice a day. Use sugar-free sweetener when possible to avoid excessive calories.  o Eat a normal breakfast.  o Set aside 15 minutes after breakfast to sit on the toilet, but do not strain to have a bowel movement.  o If you do not have a bowel movement by the third day, use an enema and repeat the above steps.   Controlling diarrhea o Switch to liquids and simpler foods for a few days to avoid stressing your intestines further. o Avoid dairy products (especially milk & ice cream) for a short time.  The intestines often can lose the ability to digest lactose when stressed. o Avoid foods that cause gassiness or bloating.  Typical foods include beans and other legumes, cabbage, broccoli, and dairy foods.  Every person has some sensitivity to other foods, so listen to our body and avoid those foods that trigger problems for you. o Adding fiber (Citrucel, Metamucil, psyllium, Miralax) gradually can help thicken stools by absorbing excess fluid and retrain the intestines to act more normally.  Slowly increase the dose over a few weeks.  Too much fiber too soon can backfire and cause cramping & bloating. o Probiotics (such as active yogurt, Align, etc) may help repopulate the intestines and colon with normal bacteria and calm down a sensitive digestive tract.  Most studies show it to be of mild help, though, and such products can be costly. o Medicines: - Bismuth subsalicylate (ex. Kayopectate, Pepto Bismol) every 30 minutes for up to 6  doses can help control diarrhea.  Avoid if pregnant. - Loperamide (Immodium) can slow down diarrhea.  Start with two tablets (4mg  total) first and then try one tablet every 6 hours.  Avoid if you are having fevers or severe pain.  If you are not better or start feeling worse, stop all medicines and call your doctor for advice o Call your doctor if you are getting worse or not better.  Sometimes further testing (cultures, endoscopy, X-ray studies, bloodwork, etc) may be needed to help diagnose and treat the cause of the diarrhea.  TROUBLESHOOTING IRREGULAR BOWELS 1) Avoid extremes of bowel movements (no bad constipation/diarrhea) 2) Miralax 17gm mixed in 8oz. water or juice-daily. May use BID as needed.  3) Gas-x,Phazyme, etc. as needed for gas & bloating.  4) Soft,bland diet. No spicy,greasy,fried foods.  5) Prilosec over-the-counter as needed  6) May hold gluten/wheat products from diet to see if symptoms improve.  7)  May try probiotics (Align, Activa, etc) to help calm the bowels down 7) If symptoms become worse call back immediately.      General Anesthesia, Care After Refer to this sheet in the next few weeks. These instructions provide you with information on caring for yourself after your procedure. Your health care provider may also give you more specific instructions. Your treatment has been planned according to current medical practices, but problems sometimes occur. Call your health care provider if you have any problems or questions after your procedure. WHAT TO EXPECT AFTER THE PROCEDURE After the procedure, it is typical to experience:  Sleepiness.  Nausea and vomiting. HOME CARE INSTRUCTIONS  For the first 24 hours after general anesthesia:  Have a responsible person with you.  Do not drive a car. If you are alone, do not take public transportation.  Do not drink alcohol.  Do not take medicine that has not been prescribed by your health care provider.  Do not sign  important papers or make important decisions.  You may resume a normal diet and activities as directed by your health care provider.  Change bandages (dressings) as directed.  If you have questions or problems that seem related to general anesthesia, call the hospital and ask for the anesthetist or anesthesiologist on call. SEEK MEDICAL CARE IF:  You have nausea and vomiting that continue the day after anesthesia.  You develop a rash. SEEK IMMEDIATE MEDICAL CARE IF:   You have difficulty breathing.  You have chest pain.  You have any allergic problems. Document Released: 02/18/2001 Document Revised: 11/17/2013 Document Reviewed: 05/28/2013 [facility] Patient Information 2015 [place], [place]. This information is not intended to replace advice given to you by your health care provider. Make sure you discuss any questions you have with your health care provider.

## 2015-07-19 NOTE — Anesthesia Postprocedure Evaluation (Signed)
  Anesthesia Post-op Note  Patient: [name]  Procedure(s) Performed: Procedure(s) (LRB): LAPAROSCOPIC CHOLECYSTECTOMY SINGLE SITE WITH INTRAOPERATIVE CHOLANGIOGRAM (N/A)  Patient Location: PACU  Anesthesia Type: General  Level of Consciousness: awake and alert   Airway and Oxygen Therapy: Patient Spontanous Breathing  Post-op Pain: mild  Post-op Assessment: Post-op Vital signs reviewed, Patient's Cardiovascular Status Stable, Respiratory Function Stable, Patent Airway and No signs of Nausea or vomiting  Last Vitals:  Filed Vitals:   07/19/15 1351  BP: 139/50  Pulse: 59  Temp: 37.2 C  Resp: 14    Post-op Vital Signs: stable   Complications: No apparent anesthesia complications

## 2015-07-19 NOTE — Interval H&P Note (Signed)
History and Physical Interval Note:  07/19/2015 8:45 AM  [name]  has presented today for surgery, with the diagnosis of Biliary Colic  The various methods of treatment have been discussed with the patient and family. After consideration of risks, benefits and other options for treatment, the patient has consented to  Procedure(s): [facility] CHOLANGIOGRAM (N/A) as a surgical intervention .  The patient's history has been reviewed, patient examined, no change in status, stable for surgery.  I have reviewed the patient's chart and labs.  Questions were answered to the patient's satisfaction.     Jessamyn Watterson C.

## 2015-07-19 NOTE — Op Note (Signed)
07/19/2015  11:02 AM  PATIENT:  [name]  69 y.o. female  Patient Care Team: Briscoe Deutscher, MD as PCP - General (Family Medicine) Arta Silence, MD as Consulting Physician (Gastroenterology) Michael Boston, MD as Consulting Physician (General Surgery)  PRE-OPERATIVE DIAGNOSIS:  Biliary Colic  POST-OPERATIVE DIAGNOSIS:  Chronic cholecystitis  PROCEDURE:  Procedure(s): LAPAROSCOPIC CHOLECYSTECTOMY SINGLE SITE WITH INTRAOPERATIVE CHOLANGIOGRAM  SURGEON:  Surgeon(s): Michael Boston, MD  ASSISTANT: RN   ANESTHESIA:   local and general  EBL:  Total I/O In: 1000 [I.V.:1000] Out: -   Delay start of Pharmacological VTE agent (>24hrs) due to surgical blood loss or risk of bleeding:  no  DRAINS: none   SPECIMEN:  Source of Specimen:  Gallbladder   DISPOSITION OF SPECIMEN:  PATHOLOGY  COUNTS:  YES  PLAN OF CARE: Discharge to home after PACU  PATIENT DISPOSITION:  PACU - hemodynamically stable.  INDICATION:   Patient with episodes of biliary colic and sludge on ultrasound.  Rest of differential diagnosis seems unlikely.  I offered cholecystectomy:   The anatomy & physiology of hepatobiliary & pancreatic function was discussed.  The pathophysiology of gallbladder dysfunction was discussed.  Natural history risks without surgery was discussed.   I feel the risks of no intervention will lead to serious problems that outweigh the operative risks; therefore, I recommended cholecystectomy to remove the pathology.  I explained laparoscopic techniques with possible need for an open approach.  Probable cholangiogram to evaluate the bilary tract was explained as well.    Risks such as bleeding, infection, abscess, leak, injury to other organs, need for further treatment, heart attack, death, and other risks were discussed.  I noted a good likelihood this will help address the problem.  Possibility that this will not correct all abdominal symptoms was explained.  Goals of post-operative  recovery were discussed as well.  We will work to minimize complications.  An educational handout further explaining the pathology and treatment options was given as well.  Questions were answered.  The patient expresses understanding & wishes to proceed with surgery.   OR FINDINGS: Adhesions of mesocolon and greater omentum to gallbladder suspicious for chronic cholecystitis.  Gallbladder with some thickening and stretched out atrophy.  Cholangiogram with no major abnormalities.  Mild leakage of contrast at cholangiocatheter site only.  No obstruction.  No stones.  No CBD leak.  DESCRIPTION:   The patient was identified & brought in the operating room. The patient was positioned supine with arms tucked. SCDs were active during the entire case. The patient underwent general anesthesia without any difficulty.  The abdomen was prepped and draped in a sterile fashion. A Surgical Timeout confirmed our plan.  I made a transverse curvilinear incision through the superior umbilical fold.  I placed a 26mm long port through the supraumbilical fascia using a modified Hassan cutdown technique. I began carbon dioxide insufflation. Camera inspection revealed no injury. There were no adhesions to the anterior abdominal wall supraumbilically.  I proceeded to continue with single site technique. I placed a #5 port in left upper aspect of the wound. I placed a 5 mm atraumatic grasper in the right inferior aspect of the wound.  I turned attention to the right upper quadrant.  There were some adhesions of greater omentum and mesocolon to the gallbladder.  The gallbladder fundus was elevated cephalad.  I freed adhesions to the greater omentum, mesocolon, duodenum.  Mostly with blunt focused dissection and ultrasonic harmonic dissection.  I freed the peritoneal coverings between the  gallbladder and the liver on the posteriolateral and anteriomedial walls. I alternated between Harmonic & blunt Maryland dissection to help get  a good critical view of the cystic artery and cystic duct. I did further dissection to free a few centimeters of the  gallbladder off the liver bed to get a good critical view of the infundibulum and cystic duct. I mobilized the cystic artery; and, after getting a good 360 view, ligated the cystic artery using the Harmonic ultrasonic dissection. I skeletonized the cystic duct.  I placed a clip on the infundibulum. I did a partial cystic duct-otomy and ensured patency. I placed a 5 [place] cholangiocatheter through a puncture site at the right subcostal ridge of the abdominal wall and directed it into the cystic duct.  We ran a cholangiogram with dilute radio-opaque contrast and continuous fluoroscopy. Contrast flowed from a side branch consistent with cystic duct cannulization. Contrast flowed up the common hepatic duct into the right and left intrahepatic chains out to secondary radicals. Contrast flowed down the common bile duct easily across the normal ampulla into the duodenum.  There is mild reflux/leaking of contrast at the cholangiogram catheter cystic duct entry site only.  This was consistent with a normal cholangiogram.  I removed the cholangiocatheter. I placed clips on the cystic duct x4.  I completed cystic duct transection. I freed the gallbladder from its remaining attachments to the liver. I ensured hemostasis on the gallbladder fossa of the liver and elsewhere.  I did copious irrigation of several liters of saline for good clear result.  I inspected the rest of the abdomen & detected no injury nor bleeding elsewhere.  I removed the gallbladder out the supraumbilical fascia inside an Endo Catch bag. I closed the fascia transversely using 0 Vicryl & 0 PDS interrupted stitches. I closed the skin using 4-0 monocryl stitch.  Sterile dressing was applied. The patient was extubated & arrived in the PACU in stable condition..  I had discussed postoperative care with the patient in the holding  area. I am about to locate the patient's family and discuss operative findings and postoperative goals / instructions.  Instructions are written in the chart as well.  [name], M.D., F.A.C.S. Gastrointestinal and Minimally Invasive Surgery [facility] [address], [place], [place] [address] [phone] Main / Paging

## 2015-07-19 NOTE — Transfer of Care (Signed)
Immediate Anesthesia Transfer of Care Note  Patient: [name]  Procedure(s) Performed: Procedure(s): LAPAROSCOPIC CHOLECYSTECTOMY SINGLE SITE WITH INTRAOPERATIVE CHOLANGIOGRAM (N/A)  Patient Location: PACU  Anesthesia Type:General  Level of Consciousness: awake, alert  and oriented  Airway & Oxygen Therapy: Patient Spontanous Breathing and Patient connected to face mask oxygen  Post-op Assessment: Report given to RN and Post -op Vital signs reviewed and stable  Post vital signs: Reviewed and stable  Last Vitals:  Filed Vitals:   07/19/15 0815  BP: 152/61  Pulse: 67  Temp: 36.4 C  Resp: 18    Complications: No apparent anesthesia complications

## 2015-07-19 NOTE — H&P (Signed)
[name] 05/31/2015 3:40 PM Location: [facility] Patient #: [id number] DOB: 04-19-1946 Divorced / Language: [name] / Race: White Female  History of Present Illness   Patient words: gallbladder.  The patient is a 69 year old female who presents for evaluation of gall stones. Patient sent for surgical consultation by Dr. Arta Silence with [facility] gastroenterology. Concern for gallbladder symptoms.  Pleasant active but smoking female. Has mild indigestion/reflux. Usually controlled with as needed Zantac. Had an episode of very sharp RIGHT upper quadrant abdominal pain. Relocated to her back. Had nausea and vomiting with it. This happened a month ago. She's had milder such symptoms since then. Does not seem consistent with her heartburn or reflux. He cannot recall the food trigger been she did eat an hour to beforehand. Didn't late at night. No sick contacts or travel history. No history of colitis or inflammatory bowel disease. Tells me she had a screening colonoscopy also for some guaiac issues. That just showed diverticulosis. I do not have the records.  Because of the more severe attack discussed with her primary care physician. Blood in stool noted. Gastroenterology was concerned for biliary colic. Ultrasound did show sludge. Liver function tests otherwise normal. Surgical consultation requested.  She does not drink alcohol. She does smoke about a pack a day. No history of cirrhosis or pancreatitis. No history of stomach ulcers or gastritis. No hematemesis.                    CLINICAL DATA: Right upper quadrant abdominal pain EXAM: ULTRASOUND ABDOMEN COMPLETE COMPARISON: None. FINDINGS: Gallbladder: Layering gallbladder sludge. No gallstones or gallbladder wall thickening. Negative sonographic Murphy's sign. Common bile duct: Diameter: 6 mm Liver: No focal lesion identified. Within normal limits in parenchymal echogenicity. IVC:  No abnormality visualized. Pancreas: Visualized portion unremarkable. Spleen: Size and appearance within normal limits. Right Kidney: Length: 11.1 cm. No mass or hydronephrosis. Left Kidney: Length: 11.9 cm. No mass or hydronephrosis. Abdominal aorta: No aneurysm visualized. Other findings: None. IMPRESSION: Layering gallbladder sludge. No associated findings to suggest acute cholecystitis. Electronically Signed By: [name] M.D. On: 05/11/2015 08:39         Other Problems [name], [name]; 05/31/2015 3:40 PM) Cervical Cancer Hemorrhoids  Past Surgical History [name], CMA; 05/31/2015 3:40 PM) Colon Polyp Removal - Colonoscopy Hysterectomy (due to cancer) - Partial Tonsillectomy  Diagnostic Studies History [name], CMA; 05/31/2015 3:40 PM) Colonoscopy within last year Mammogram never Pap Smear 1-5 years ago  Allergies [name], CMA; 05/31/2015 3:42 PM) Penicillamine *ASSORTED CLASSES*  Medication History [name], CMA; 05/31/2015 3:43 PM) Estradiol (0.5MG  Tablet, Oral) Active. Fluticasone Propionate (50MCG/ACT Suspension, Nasal) Active. Vitamin D (Cholecalciferol) (1000UNIT Tablet, Oral) Active. Flonase Allergy Relief (50MCG/ACT Suspension, Nasal) Active. Medications Reconciled  Social History [name], CMA; 05/31/2015 3:40 PM) Caffeine use Coffee, Tea. No alcohol use No drug use Tobacco use Current every day smoker.  Family History [name], [place]; 05/31/2015 3:40 PM) Breast Cancer Daughter.  Pregnancy / Birth History [name], [place]; 05/31/2015 3:40 PM) Age at menarche 55 years. Age of menopause <43 Contraceptive History Oral contraceptives. Gravida 4 Maternal age 33-25 Para 3  Review of Systems ([place]; 05/31/2015 3:40 PM) General Not Present- Appetite Loss, Chills, Fatigue, Fever, Night Sweats, Weight Gain and Weight Loss. Skin Not Present- Change in Wart/Mole, Dryness, Hives, Jaundice,  New Lesions, Non-Healing Wounds, Rash and Ulcer. HEENT Present- Seasonal Allergies. Not Present- Earache, Hearing Loss, Hoarseness, Nose Bleed, Oral Ulcers, Ringing in  the Ears, Sinus Pain, Sore Throat, Visual Disturbances, Wears glasses/contact lenses and Yellow Eyes. Respiratory Not Present- Bloody sputum, Chronic Cough, Difficulty Breathing, Snoring and Wheezing. Breast Not Present- Breast Mass, Breast Pain, Nipple Discharge and Skin Changes. Cardiovascular Not Present- Chest Pain, Difficulty Breathing Lying Down, Leg Cramps, Palpitations, Rapid Heart Rate, Shortness of Breath and Swelling of Extremities. Gastrointestinal Present- Gets full quickly at meals. Not Present- Abdominal Pain, Bloating, Bloody Stool, Change in Bowel Habits, Chronic diarrhea, Constipation, Difficulty Swallowing, Excessive gas, Hemorrhoids, Indigestion, Nausea, Rectal Pain and Vomiting. Female Genitourinary Not Present- Frequency, Nocturia, Painful Urination, Pelvic Pain and Urgency. Musculoskeletal Not Present- Back Pain, Joint Pain, Joint Stiffness, Muscle Pain, Muscle Weakness and Swelling of Extremities. Neurological Not Present- Decreased Memory, Fainting, Headaches, Numbness, Seizures, Tingling, Tremor, Trouble walking and Weakness. Psychiatric Not Present- Anxiety, Bipolar, Change in Sleep Pattern, Depression, Fearful and Frequent crying. Endocrine Present- Hot flashes. Not Present- Cold Intolerance, Excessive Hunger, Hair Changes, Heat Intolerance and New Diabetes. Hematology Not Present- Easy Bruising, Excessive bleeding, Gland problems, HIV and Persistent Infections.   Vitals (Sonya Randall CMA; 05/31/2015 3:41 PM) 05/31/2015 3:40 PM Weight: 142.8 lb Height: 62in Body Surface Area: 1.68 m Body Mass Index: 26.12 kg/m Temp.: 97.53F(Temporal)  Pulse: 78 (Regular)  BP: 130/70 (Sitting, Left Arm, Standard)    Physical Exam [name] MD; 05/31/2015 4:59 PM) General Mental Status-Alert. General  Appearance-Not in acute distress, Not Sickly. Orientation-Oriented X3. Hydration-Well hydrated. Voice-Normal.  Integumentary Global Assessment Upon inspection and palpation of skin surfaces of the - Axillae: non-tender, no inflammation or ulceration, no drainage. and Distribution of scalp and body hair is normal. General Characteristics Temperature - normal warmth is noted.  Head and Neck Head-normocephalic, atraumatic with no lesions or palpable masses. Face Global Assessment - atraumatic, no absence of expression. Neck Global Assessment - no abnormal movements, no bruit auscultated on the right, no bruit auscultated on the left, no decreased range of motion, non-tender. Trachea-midline. Thyroid Gland Characteristics - non-tender.  Eye Eyeball - Left-Extraocular movements intact, No Nystagmus. Eyeball - Right-Extraocular movements intact, No Nystagmus. Cornea - Left-No Hazy. Cornea - Right-No Hazy. Sclera/Conjunctiva - Left-No scleral icterus, No Discharge. Sclera/Conjunctiva - Right-No scleral icterus, No Discharge. Pupil - Left-Direct reaction to light normal. Pupil - Right-Direct reaction to light normal.  ENMT Ears Pinna - Left - no drainage observed, no generalized tenderness observed. Right - no drainage observed, no generalized tenderness observed. Nose and Sinuses External Inspection of the Nose - no destructive lesion observed. Inspection of the nares - Left - quiet respiration. Right - quiet respiration. Mouth and Throat Lips - Upper Lip - no fissures observed, no pallor noted. Lower Lip - no fissures observed, no pallor noted. Nasopharynx - no discharge present. Oral Cavity/Oropharynx - Tongue - no dryness observed. Oral Mucosa - no cyanosis observed. Hypopharynx - no evidence of airway distress observed.  Chest and Lung Exam Inspection Movements - Normal and Symmetrical. Accessory muscles - No use of accessory muscles in  breathing. Palpation Palpation of the chest reveals - Non-tender. Auscultation Breath sounds - Normal and Clear.  Cardiovascular Auscultation Rhythm - Regular. Murmurs & Other Heart Sounds - Auscultation of the heart reveals - No Murmurs and No Systolic Clicks.  Abdomen Inspection Inspection of the abdomen reveals - No Visible peristalsis and No Abnormal pulsations. Umbilicus - No Bleeding, No Urine drainage. Palpation/Percussion Palpation and Percussion of the abdomen reveal - Soft, Non Tender, No Rebound tenderness, No Rigidity (guarding) and No Cutaneous hyperesthesia. Note: Abdomen soft. Mild  discomfort in RIGHT upper quadrant but no true Murphy sign. 1 cm mass just above bellybutton. Possible lipoma versus small incarcerated umbilical hernia. Not reducible. Nontender.   Female Genitourinary Sexual Maturity Tanner 5 - Adult hair pattern. Note: No vaginal bleeding nor discharge   Peripheral Vascular Upper Extremity Inspection - Left - No Cyanotic nailbeds, Not Ischemic. Right - No Cyanotic nailbeds, Not Ischemic.  Neurologic Neurologic evaluation reveals -normal attention span and ability to concentrate, able to name objects and repeat phrases. Appropriate fund of knowledge , normal sensation and normal coordination. Mental Status Affect - not angry, not paranoid. Cranial Nerves-Normal Bilaterally. Gait-Normal.  Neuropsychiatric Mental status exam performed with findings of-able to articulate well with normal speech/language, rate, volume and coherence, thought content normal with ability to perform basic computations and apply abstract reasoning and no evidence of hallucinations, delusions, obsessions or homicidal/suicidal ideation.  Musculoskeletal Global Assessment Spine, Ribs and Pelvis - no instability, subluxation or laxity. Right Upper Extremity - no instability, subluxation or laxity.  Lymphatic Head & Neck  General Head & Neck Lymphatics: Bilateral  - Description - No Localized lymphadenopathy. Axillary  General Axillary Region: Bilateral - Description - No Localized lymphadenopathy. Femoral & Inguinal  Generalized Femoral & Inguinal Lymphatics: Left - Description - No Localized lymphadenopathy. Right - Description - No Localized lymphadenopathy.    Assessment & Plan CHRONIC CHOLECYSTITIS (575.11  K81.1) Impression: Postprandial RIGHT upper quadrant radiating to the back pain with nausea and vomiting strongly suspicious for biliary colic. Ultrasound with at least sludge. Rest of the differential diagnosis unlikely.   She would benefit from cholecystectomy. Reasonable laparoscopic single site approach. He has a risk that this does not solve her problems is incision and etiology. She wishes to proceed with surgery first.  Patient ready for surgery   Current Plans  Schedule for Surgery Written instructions provided Pt Education - Pamphlet Given - Laparoscopic Gallbladder Surgery: discussed with patient and provided information. The anatomy & physiology of hepatobiliary & pancreatic function was discussed. The pathophysiology of gallbladder dysfunction was discussed. Natural history risks without surgery was discussed. I feel the risks of no intervention will lead to serious problems that outweigh the operative risks; therefore, I recommended cholecystectomy to remove the pathology. I explained laparoscopic techniques with possible need for an open approach. Probable cholangiogram to evaluate the bilary tract was explained as well.  Risks such as bleeding, infection, abscess, leak, injury to other organs, need for further treatment, heart attack, death, and other risks were discussed. I noted a good likelihood this will help address the problem. Possibility that this will not correct all abdominal symptoms was explained. Goals of post-operative recovery were discussed as well. We will work to minimize complications. An educational handout  further explaining the pathology and treatment options was given as well. Questions were answered. The patient expresses understanding & wishes to proceed with surgery. Pt Education - [facility] Laparosopic Post Op HCI (Evona Westra) Pt Education - [facility] Good Bowel Health (Samani Deal) Pt Education - [facility] Pain Control (Dadrian Ballantine) TOBACCO ABUSE (305.1  Z72.0) Impression: STOP SMOKING!!  STOP SMOKING! We talked to the patient about the dangers of smoking. We stressed that tobacco use dramatically increases the risk of peri-operative complications such as infection, tissue necrosis leaving to problems with incision/wound and organ healing, hernia, chronic pain, heart attack, stroke, DVT, pulmonary embolism, and death. We noted there are programs in our community to help stop smoking. Information was available. Current Plans Pt Education - [facility] Free Text Education/Instructions: discussed with patient  and provided information.  [name], M.D., F.A.C.S. Gastrointestinal and Minimally Invasive Surgery [facility] [address] [place], [place] [address] [phone] Main / Paging

## 2015-07-20 ENCOUNTER — Encounter (HOSPITAL_COMMUNITY): Payer: Self-pay | Admitting: Surgery

## 2015-08-17 DIAGNOSIS — Z23 Encounter for immunization: Secondary | ICD-10-CM | POA: Diagnosis not present

## 2015-10-12 DIAGNOSIS — H04123 Dry eye syndrome of bilateral lacrimal glands: Secondary | ICD-10-CM | POA: Diagnosis not present

## 2016-01-13 DIAGNOSIS — H40001 Preglaucoma, unspecified, right eye: Secondary | ICD-10-CM | POA: Diagnosis not present

## 2016-01-13 DIAGNOSIS — H401122 Primary open-angle glaucoma, left eye, moderate stage: Secondary | ICD-10-CM | POA: Diagnosis not present

## 2016-02-10 DIAGNOSIS — H401122 Primary open-angle glaucoma, left eye, moderate stage: Secondary | ICD-10-CM | POA: Diagnosis not present

## 2016-02-10 DIAGNOSIS — D485 Neoplasm of uncertain behavior of skin: Secondary | ICD-10-CM | POA: Diagnosis not present

## 2016-06-15 DIAGNOSIS — H401111 Primary open-angle glaucoma, right eye, mild stage: Secondary | ICD-10-CM | POA: Diagnosis not present

## 2016-06-15 DIAGNOSIS — H401122 Primary open-angle glaucoma, left eye, moderate stage: Secondary | ICD-10-CM | POA: Diagnosis not present

## 2016-07-20 DIAGNOSIS — H401122 Primary open-angle glaucoma, left eye, moderate stage: Secondary | ICD-10-CM | POA: Diagnosis not present

## 2016-07-20 DIAGNOSIS — H401111 Primary open-angle glaucoma, right eye, mild stage: Secondary | ICD-10-CM | POA: Diagnosis not present

## 2016-08-02 ENCOUNTER — Other Ambulatory Visit: Payer: Self-pay

## 2016-10-29 DIAGNOSIS — H409 Unspecified glaucoma: Secondary | ICD-10-CM | POA: Diagnosis not present

## 2016-10-29 DIAGNOSIS — Z23 Encounter for immunization: Secondary | ICD-10-CM | POA: Diagnosis not present

## 2016-10-29 DIAGNOSIS — M810 Age-related osteoporosis without current pathological fracture: Secondary | ICD-10-CM | POA: Diagnosis not present

## 2016-10-29 DIAGNOSIS — J309 Allergic rhinitis, unspecified: Secondary | ICD-10-CM | POA: Diagnosis not present

## 2016-10-29 DIAGNOSIS — E559 Vitamin D deficiency, unspecified: Secondary | ICD-10-CM | POA: Diagnosis not present

## 2016-10-29 DIAGNOSIS — Z1159 Encounter for screening for other viral diseases: Secondary | ICD-10-CM | POA: Diagnosis not present

## 2016-11-18 ENCOUNTER — Encounter (HOSPITAL_COMMUNITY): Payer: Self-pay

## 2016-11-18 ENCOUNTER — Inpatient Hospital Stay (HOSPITAL_COMMUNITY): Payer: Medicare Other | Admitting: Certified Registered Nurse Anesthetist

## 2016-11-18 ENCOUNTER — Emergency Department (HOSPITAL_COMMUNITY): Payer: Medicare Other

## 2016-11-18 ENCOUNTER — Encounter (HOSPITAL_COMMUNITY)
Admission: EM | Disposition: A | Discharge status: Home Health | Payer: Self-pay | Source: Home / Self Care | Attending: Internal Medicine

## 2016-11-18 ENCOUNTER — Inpatient Hospital Stay (HOSPITAL_COMMUNITY): Payer: Medicare Other

## 2016-11-18 ENCOUNTER — Inpatient Hospital Stay (HOSPITAL_COMMUNITY)
Admission: EM | Admit: 2016-11-18 | Discharge: 2016-11-20 | DRG: 470 | Disposition: A | Discharge status: Home Health | Payer: Medicare Other | Attending: Internal Medicine | Admitting: Internal Medicine

## 2016-11-18 DIAGNOSIS — W109XXA Fall (on) (from) unspecified stairs and steps, initial encounter: Secondary | ICD-10-CM | POA: Diagnosis present

## 2016-11-18 DIAGNOSIS — S299XXA Unspecified injury of thorax, initial encounter: Secondary | ICD-10-CM | POA: Diagnosis not present

## 2016-11-18 DIAGNOSIS — K5790 Diverticulosis of intestine, part unspecified, without perforation or abscess without bleeding: Secondary | ICD-10-CM | POA: Diagnosis not present

## 2016-11-18 DIAGNOSIS — Z79899 Other long term (current) drug therapy: Secondary | ICD-10-CM | POA: Diagnosis not present

## 2016-11-18 DIAGNOSIS — Z90710 Acquired absence of both cervix and uterus: Secondary | ICD-10-CM | POA: Diagnosis not present

## 2016-11-18 DIAGNOSIS — E876 Hypokalemia: Secondary | ICD-10-CM | POA: Diagnosis present

## 2016-11-18 DIAGNOSIS — K811 Chronic cholecystitis: Secondary | ICD-10-CM | POA: Diagnosis present

## 2016-11-18 DIAGNOSIS — S72042A Displaced fracture of base of neck of left femur, initial encounter for closed fracture: Secondary | ICD-10-CM | POA: Diagnosis not present

## 2016-11-18 DIAGNOSIS — Z8542 Personal history of malignant neoplasm of other parts of uterus: Secondary | ICD-10-CM | POA: Diagnosis not present

## 2016-11-18 DIAGNOSIS — S72002A Fracture of unspecified part of neck of left femur, initial encounter for closed fracture: Principal | ICD-10-CM | POA: Diagnosis present

## 2016-11-18 DIAGNOSIS — W010XXA Fall on same level from slipping, tripping and stumbling without subsequent striking against object, initial encounter: Secondary | ICD-10-CM | POA: Diagnosis not present

## 2016-11-18 DIAGNOSIS — R102 Pelvic and perineal pain: Secondary | ICD-10-CM | POA: Diagnosis not present

## 2016-11-18 DIAGNOSIS — F17200 Nicotine dependence, unspecified, uncomplicated: Secondary | ICD-10-CM | POA: Diagnosis present

## 2016-11-18 DIAGNOSIS — Z720 Tobacco use: Secondary | ICD-10-CM | POA: Diagnosis not present

## 2016-11-18 DIAGNOSIS — Z880 Allergy status to penicillin: Secondary | ICD-10-CM | POA: Diagnosis not present

## 2016-11-18 DIAGNOSIS — S72001A Fracture of unspecified part of neck of right femur, initial encounter for closed fracture: Secondary | ICD-10-CM | POA: Diagnosis not present

## 2016-11-18 DIAGNOSIS — T148XXA Other injury of unspecified body region, initial encounter: Secondary | ICD-10-CM | POA: Diagnosis not present

## 2016-11-18 DIAGNOSIS — D649 Anemia, unspecified: Secondary | ICD-10-CM | POA: Diagnosis not present

## 2016-11-18 DIAGNOSIS — D500 Iron deficiency anemia secondary to blood loss (chronic): Secondary | ICD-10-CM | POA: Diagnosis present

## 2016-11-18 DIAGNOSIS — R52 Pain, unspecified: Secondary | ICD-10-CM

## 2016-11-18 DIAGNOSIS — S72041A Displaced fracture of base of neck of right femur, initial encounter for closed fracture: Secondary | ICD-10-CM | POA: Diagnosis not present

## 2016-11-18 DIAGNOSIS — D62 Acute posthemorrhagic anemia: Secondary | ICD-10-CM | POA: Diagnosis not present

## 2016-11-18 HISTORY — PX: HIP ARTHROPLASTY: SHX981

## 2016-11-18 HISTORY — DX: Diverticulosis of intestine, part unspecified, without perforation or abscess without bleeding: K57.90

## 2016-11-18 LAB — CBC WITH DIFFERENTIAL/PLATELET
Basophils Absolute: 0 10*3/uL (ref 0.0–0.1)
Basophils Relative: 0 %
EOS ABS: 0 10*3/uL (ref 0.0–0.7)
EOS PCT: 0 %
HCT: 37.3 % (ref 36.0–46.0)
Hemoglobin: 13 g/dL (ref 12.0–15.0)
LYMPHS ABS: 0.7 10*3/uL (ref 0.7–4.0)
Lymphocytes Relative: 7 %
MCH: 30.6 pg (ref 26.0–34.0)
MCHC: 34.9 g/dL (ref 30.0–36.0)
MCV: 87.8 fL (ref 78.0–100.0)
Monocytes Absolute: 0.7 10*3/uL (ref 0.1–1.0)
Monocytes Relative: 7 %
Neutro Abs: 9 10*3/uL — ABNORMAL HIGH (ref 1.7–7.7)
Neutrophils Relative %: 86 %
PLATELETS: 220 10*3/uL (ref 150–400)
RBC: 4.25 MIL/uL (ref 3.87–5.11)
RDW: 12.5 % (ref 11.5–15.5)
WBC: 10.5 10*3/uL (ref 4.0–10.5)

## 2016-11-18 LAB — COMPREHENSIVE METABOLIC PANEL
ALT: 16 U/L (ref 14–54)
ANION GAP: 7 (ref 5–15)
AST: 17 U/L (ref 15–41)
Albumin: 3.5 g/dL (ref 3.5–5.0)
Alkaline Phosphatase: 89 U/L (ref 38–126)
BUN: 19 mg/dL (ref 6–20)
CHLORIDE: 106 mmol/L (ref 101–111)
CO2: 25 mmol/L (ref 22–32)
Calcium: 8.6 mg/dL — ABNORMAL LOW (ref 8.9–10.3)
Creatinine, Ser: 0.62 mg/dL (ref 0.44–1.00)
GFR calc non Af Amer: >60 mL/min (ref >60)
Glucose, Bld: 116 mg/dL — ABNORMAL HIGH (ref 65–99)
POTASSIUM: 3.6 mmol/L (ref 3.5–5.1)
SODIUM: 138 mmol/L (ref 135–145)
Total Bilirubin: 0.4 mg/dL (ref 0.3–1.2)
Total Protein: 6 g/dL — ABNORMAL LOW (ref 6.5–8.1)

## 2016-11-18 LAB — ABO/RH: ABO/RH(D): A POS

## 2016-11-18 LAB — SURGICAL PCR SCREEN
MRSA, PCR: NEGATIVE
Staphylococcus aureus: NEGATIVE

## 2016-11-18 LAB — PROTIME-INR
INR: 0.95
Prothrombin Time: 12.7 seconds (ref 11.4–15.2)

## 2016-11-18 LAB — TYPE AND SCREEN
ABO/RH(D): A POS
Antibody Screen: NEGATIVE

## 2016-11-18 SURGERY — HEMIARTHROPLASTY, HIP, DIRECT ANTERIOR APPROACH, FOR FRACTURE
Anesthesia: General | Site: Hip | Laterality: Left

## 2016-11-18 MED ORDER — CEFAZOLIN SODIUM 1 G IJ SOLR
INTRAMUSCULAR | Status: AC
  Filled 2016-11-18: qty 10

## 2016-11-18 MED ORDER — LIDOCAINE 2% (20 MG/ML) 5 ML SYRINGE
INTRAMUSCULAR | Status: AC
  Filled 2016-11-18: qty 5

## 2016-11-18 MED ORDER — MORPHINE SULFATE (PF) 2 MG/ML IV SOLN: 2.0000 mg | INTRAVENOUS | Status: DC | PRN

## 2016-11-18 MED ORDER — METOCLOPRAMIDE HCL 5 MG/ML IJ SOLN: 5.0000 mg | Freq: Three times a day (TID) | INTRAMUSCULAR | Status: DC | PRN

## 2016-11-18 MED ORDER — SODIUM CHLORIDE 0.9 % IR SOLN
Status: DC | PRN
  Administered 2016-11-18: 1

## 2016-11-18 MED ORDER — SODIUM CHLORIDE 0.9 % IV SOLN
Freq: Once | INTRAVENOUS | Status: AC
  Administered 2016-11-18: 150 mL/h via INTRAVENOUS

## 2016-11-18 MED ORDER — LIDOCAINE HCL (CARDIAC) 20 MG/ML IV SOLN
INTRAVENOUS | Status: DC | PRN
  Administered 2016-11-18: 60 mg via INTRAVENOUS

## 2016-11-18 MED ORDER — BUPIVACAINE HCL (PF) 0.25 % IJ SOLN
INTRAMUSCULAR | Status: AC
  Filled 2016-11-18: qty 30

## 2016-11-18 MED ORDER — ONDANSETRON HCL 4 MG/2ML IJ SOLN: 4.0000 mg | Freq: Four times a day (QID) | INTRAMUSCULAR | Status: DC | PRN

## 2016-11-18 MED ORDER — SENNA 8.6 MG PO TABS
1.0000 | ORAL_TABLET | Freq: Two times a day (BID) | ORAL | Status: DC
  Administered 2016-11-20: 8.6 mg via ORAL
  Filled 2016-11-18 (×3): qty 1

## 2016-11-18 MED ORDER — SUGAMMADEX SODIUM 200 MG/2ML IV SOLN
INTRAVENOUS | Status: DC | PRN
  Administered 2016-11-18: 200 mg via INTRAVENOUS

## 2016-11-18 MED ORDER — ESTRADIOL 1 MG PO TABS: 0.5000 mg | ORAL_TABLET | Freq: Every day | ORAL | Status: DC

## 2016-11-18 MED ORDER — METHOCARBAMOL 500 MG PO TABS
500.0000 mg | ORAL_TABLET | Freq: Four times a day (QID) | ORAL | Status: DC | PRN
  Administered 2016-11-19 – 2016-11-20 (×3): 500 mg via ORAL
  Filled 2016-11-18 (×3): qty 1

## 2016-11-18 MED ORDER — FENTANYL CITRATE (PF) 100 MCG/2ML IJ SOLN
INTRAMUSCULAR | Status: AC
  Filled 2016-11-18: qty 2

## 2016-11-18 MED ORDER — ROCURONIUM BROMIDE 10 MG/ML (PF) SYRINGE
PREFILLED_SYRINGE | INTRAVENOUS | Status: AC
  Filled 2016-11-18: qty 5

## 2016-11-18 MED ORDER — ENOXAPARIN SODIUM 40 MG/0.4ML ~~LOC~~ SOLN
40.0000 mg | Freq: Every day | SUBCUTANEOUS | Status: DC
  Administered 2016-11-19 – 2016-11-20 (×2): 40 mg via SUBCUTANEOUS
  Filled 2016-11-18 (×2): qty 0.4

## 2016-11-18 MED ORDER — ROCURONIUM BROMIDE 100 MG/10ML IV SOLN
INTRAVENOUS | Status: DC | PRN
  Administered 2016-11-18: 50 mg via INTRAVENOUS

## 2016-11-18 MED ORDER — DEXAMETHASONE SODIUM PHOSPHATE 10 MG/ML IJ SOLN
INTRAMUSCULAR | Status: AC
  Filled 2016-11-18: qty 1

## 2016-11-18 MED ORDER — ONDANSETRON HCL 4 MG PO TABS: 4.0000 mg | ORAL_TABLET | Freq: Four times a day (QID) | ORAL | Status: DC | PRN

## 2016-11-18 MED ORDER — POVIDONE-IODINE 10 % EX SWAB: 2.0000 "application " | Freq: Once | CUTANEOUS | Status: DC

## 2016-11-18 MED ORDER — OXYCODONE HCL 5 MG PO TABS
5.0000 mg | ORAL_TABLET | ORAL | Status: DC | PRN
  Administered 2016-11-18 – 2016-11-20 (×8): 10 mg via ORAL
  Filled 2016-11-18 (×7): qty 2

## 2016-11-18 MED ORDER — OXYCODONE HCL 5 MG PO TABS
ORAL_TABLET | ORAL | Status: AC
  Filled 2016-11-18: qty 2

## 2016-11-18 MED ORDER — DOCUSATE SODIUM 100 MG PO CAPS
100.0000 mg | ORAL_CAPSULE | Freq: Two times a day (BID) | ORAL | Status: DC
  Administered 2016-11-20: 100 mg via ORAL
  Filled 2016-11-18 (×3): qty 1

## 2016-11-18 MED ORDER — HYDROCODONE-ACETAMINOPHEN 5-325 MG PO TABS: 1.0000 | ORAL_TABLET | Freq: Four times a day (QID) | ORAL | Status: DC | PRN

## 2016-11-18 MED ORDER — PROPOFOL 10 MG/ML IV BOLUS
INTRAVENOUS | Status: DC | PRN
  Administered 2016-11-18: 80 mg via INTRAVENOUS

## 2016-11-18 MED ORDER — DEXAMETHASONE SODIUM PHOSPHATE 4 MG/ML IJ SOLN
INTRAMUSCULAR | Status: DC | PRN
  Administered 2016-11-18: 10 mg via INTRAVENOUS

## 2016-11-18 MED ORDER — FENTANYL CITRATE (PF) 100 MCG/2ML IJ SOLN
INTRAMUSCULAR | Status: AC
Start: 2016-11-18 — End: 2016-11-18
  Administered 2016-11-18: 50 ug via INTRAVENOUS
  Filled 2016-11-18: qty 2

## 2016-11-18 MED ORDER — EPHEDRINE SULFATE 50 MG/ML IJ SOLN
INTRAMUSCULAR | Status: DC | PRN
Start: 2016-11-18 — End: 2016-11-18
  Administered 2016-11-18: 5 mg via INTRAVENOUS
  Administered 2016-11-18: 10 mg via INTRAVENOUS

## 2016-11-18 MED ORDER — FENTANYL CITRATE (PF) 100 MCG/2ML IJ SOLN
25.0000 ug | INTRAMUSCULAR | Status: DC | PRN
  Administered 2016-11-18 (×2): 50 ug via INTRAVENOUS

## 2016-11-18 MED ORDER — PHENYLEPHRINE 40 MCG/ML (10ML) SYRINGE FOR IV PUSH (FOR BLOOD PRESSURE SUPPORT)
PREFILLED_SYRINGE | INTRAVENOUS | Status: AC
  Filled 2016-11-18: qty 10

## 2016-11-18 MED ORDER — FENTANYL CITRATE (PF) 100 MCG/2ML IJ SOLN
50.0000 ug | Freq: Once | INTRAMUSCULAR | Status: AC
  Administered 2016-11-18: 50 ug via INTRAVENOUS
  Filled 2016-11-18: qty 2

## 2016-11-18 MED ORDER — ADULT MULTIVITAMIN W/MINERALS CH
1.0000 | ORAL_TABLET | Freq: Every day | ORAL | Status: DC
  Administered 2016-11-19 – 2016-11-20 (×2): 1 via ORAL
  Filled 2016-11-18 (×2): qty 1

## 2016-11-18 MED ORDER — BUPIVACAINE HCL 0.25 % IJ SOLN
INTRAMUSCULAR | Status: DC | PRN
  Administered 2016-11-18: 30 mL

## 2016-11-18 MED ORDER — PHENYLEPHRINE HCL 10 MG/ML IJ SOLN
INTRAMUSCULAR | Status: DC | PRN
  Administered 2016-11-18 (×4): 80 ug via INTRAVENOUS

## 2016-11-18 MED ORDER — CEFAZOLIN SODIUM-DEXTROSE 2-4 GM/100ML-% IV SOLN
2.0000 g | Freq: Four times a day (QID) | INTRAVENOUS | Status: AC
  Administered 2016-11-19 (×3): 2 g via INTRAVENOUS
  Filled 2016-11-18 (×3): qty 100

## 2016-11-18 MED ORDER — SUGAMMADEX SODIUM 200 MG/2ML IV SOLN
INTRAVENOUS | Status: AC
  Filled 2016-11-18: qty 2

## 2016-11-18 MED ORDER — ACETAMINOPHEN 500 MG PO TABS
1000.0000 mg | ORAL_TABLET | Freq: Four times a day (QID) | ORAL | Status: AC
  Administered 2016-11-19 (×4): 1000 mg via ORAL
  Filled 2016-11-18 (×3): qty 2

## 2016-11-18 MED ORDER — PROPOFOL 10 MG/ML IV BOLUS
INTRAVENOUS | Status: AC
  Filled 2016-11-18: qty 20

## 2016-11-18 MED ORDER — METHOCARBAMOL 1000 MG/10ML IJ SOLN: 500.0000 mg | Freq: Four times a day (QID) | INTRAVENOUS | Status: DC | PRN

## 2016-11-18 MED ORDER — CEFAZOLIN SODIUM 1 G IJ SOLR
INTRAMUSCULAR | Status: DC | PRN
  Administered 2016-11-18: 2 g via INTRAMUSCULAR

## 2016-11-18 MED ORDER — CEFAZOLIN SODIUM-DEXTROSE 2-4 GM/100ML-% IV SOLN: 2.0000 g | INTRAVENOUS | Status: DC

## 2016-11-18 MED ORDER — FLUTICASONE PROPIONATE 50 MCG/ACT NA SUSP: 2.0000 | Freq: Every day | NASAL | Status: DC

## 2016-11-18 MED ORDER — FENTANYL CITRATE (PF) 100 MCG/2ML IJ SOLN
INTRAMUSCULAR | Status: DC | PRN
  Administered 2016-11-18: 100 ug via INTRAVENOUS
  Administered 2016-11-18: 50 ug via INTRAVENOUS
  Administered 2016-11-18: 100 ug via INTRAVENOUS

## 2016-11-18 MED ORDER — EPHEDRINE 5 MG/ML INJ
INTRAVENOUS | Status: AC
  Filled 2016-11-18: qty 10

## 2016-11-18 MED ORDER — ONDANSETRON HCL 4 MG/2ML IJ SOLN
INTRAMUSCULAR | Status: AC
  Filled 2016-11-18: qty 2

## 2016-11-18 MED ORDER — CHLORHEXIDINE GLUCONATE 4 % EX LIQD: 60.0000 mL | Freq: Once | CUTANEOUS | Status: DC

## 2016-11-18 MED ORDER — MORPHINE SULFATE (PF) 2 MG/ML IV SOLN: 0.5000 mg | INTRAVENOUS | Status: DC | PRN

## 2016-11-18 MED ORDER — METOCLOPRAMIDE HCL 5 MG PO TABS: 5.0000 mg | ORAL_TABLET | Freq: Three times a day (TID) | ORAL | Status: DC | PRN

## 2016-11-18 MED ORDER — LACTATED RINGERS IV SOLN
INTRAVENOUS | Status: DC | PRN
  Administered 2016-11-18 (×2): via INTRAVENOUS

## 2016-11-18 MED ORDER — ACETAMINOPHEN 325 MG PO TABS: 650.0000 mg | ORAL_TABLET | Freq: Four times a day (QID) | ORAL | Status: DC | PRN

## 2016-11-18 MED ORDER — ACETAMINOPHEN 650 MG RE SUPP: 650.0000 mg | Freq: Four times a day (QID) | RECTAL | Status: DC | PRN

## 2016-11-18 MED ORDER — NICOTINE 21 MG/24HR TD PT24
21.0000 mg | MEDICATED_PATCH | Freq: Once | TRANSDERMAL | Status: AC
  Administered 2016-11-18: 21 mg via TRANSDERMAL
  Filled 2016-11-18: qty 1

## 2016-11-18 MED ORDER — ONDANSETRON HCL 4 MG/2ML IJ SOLN
INTRAMUSCULAR | Status: DC | PRN
  Administered 2016-11-18: 4 mg via INTRAVENOUS

## 2016-11-18 SURGICAL SUPPLY — 46 items
BLADE SAGITTAL (BLADE) ×2
BLADE SAGITTAL 25.0X1.27X90 (BLADE) ×2 IMPLANT
BLADE SAGITTAL 25.0X1.27X90MM (BLADE) ×1
BLADE SAW THK.89X75X18XSGTL (BLADE) ×1 IMPLANT
CAPT HIP HEMI 2 ×3 IMPLANT
COVER SURGICAL LIGHT HANDLE (MISCELLANEOUS) ×3 IMPLANT
DRAPE HIP W/POCKET STRL (DRAPE) ×3 IMPLANT
DRAPE INCISE IOBAN 85X60 (DRAPES) ×3 IMPLANT
DRAPE ORTHO SPLIT 77X108 STRL (DRAPES)
DRAPE SURG ORHT 6 SPLT 77X108 (DRAPES) IMPLANT
DRAPE U-SHAPE 47X51 STRL (DRAPES) ×6 IMPLANT
DRSG AQUACEL AG ADV 3.5X10 (GAUZE/BANDAGES/DRESSINGS) ×3 IMPLANT
DRSG AQUACEL AG ADV 3.5X14 (GAUZE/BANDAGES/DRESSINGS) ×3 IMPLANT
DURAPREP 26ML APPLICATOR (WOUND CARE) ×6 IMPLANT
ELECT BLADE 4.0 EZ CLEAN MEGAD (MISCELLANEOUS)
ELECT CAUTERY BLADE 6.4 (BLADE) ×3 IMPLANT
ELECT REM PT RETURN 9FT ADLT (ELECTROSURGICAL) ×3
ELECTRODE BLDE 4.0 EZ CLN MEGD (MISCELLANEOUS) IMPLANT
ELECTRODE REM PT RTRN 9FT ADLT (ELECTROSURGICAL) ×1 IMPLANT
FACESHIELD WRAPAROUND (MASK) ×6 IMPLANT
GLOVE BIO SURGEON STRL SZ7.5 (GLOVE) ×3 IMPLANT
GLOVE BIOGEL PI IND STRL 8 (GLOVE) ×1 IMPLANT
GLOVE BIOGEL PI INDICATOR 8 (GLOVE) ×2
GOWN STRL REUS W/ TWL LRG LVL3 (GOWN DISPOSABLE) ×2 IMPLANT
GOWN STRL REUS W/ TWL XL LVL3 (GOWN DISPOSABLE) ×1 IMPLANT
GOWN STRL REUS W/TWL LRG LVL3 (GOWN DISPOSABLE) ×4
GOWN STRL REUS W/TWL XL LVL3 (GOWN DISPOSABLE) ×2
KIT BASIN OR (CUSTOM PROCEDURE TRAY) ×3 IMPLANT
KIT ROOM TURNOVER OR (KITS) ×3 IMPLANT
MANIFOLD NEPTUNE II (INSTRUMENTS) ×3 IMPLANT
NEEDLE HYPO 22GX1.5 SAFETY (NEEDLE) ×3 IMPLANT
NS IRRIG 1000ML POUR BTL (IV SOLUTION) ×3 IMPLANT
PACK TOTAL JOINT (CUSTOM PROCEDURE TRAY) ×3 IMPLANT
PAD ARMBOARD 7.5X6 YLW CONV (MISCELLANEOUS) ×6 IMPLANT
STAPLER VISISTAT 35W (STAPLE) ×3 IMPLANT
SUT ETHIBOND 2 V 37 (SUTURE) ×3 IMPLANT
SUT MON AB 2-0 CT1 36 (SUTURE) ×9 IMPLANT
SUT PDS AB 1 CT  36 (SUTURE) ×2
SUT PDS AB 1 CT 36 (SUTURE) ×1 IMPLANT
SUT VIC AB 0 CT1 27 (SUTURE)
SUT VIC AB 0 CT1 27XBRD ANBCTR (SUTURE) IMPLANT
SUT VLOC 180 0 24IN GS25 (SUTURE) ×3 IMPLANT
SYR CONTROL 10ML LL (SYRINGE) ×3 IMPLANT
TOWEL OR 17X24 6PK STRL BLUE (TOWEL DISPOSABLE) ×3 IMPLANT
TOWEL OR 17X26 10 PK STRL BLUE (TOWEL DISPOSABLE) ×3 IMPLANT
YANKAUER SUCT BULB TIP NO VENT (SUCTIONS) ×3 IMPLANT

## 2016-11-18 NOTE — Consult Note (Signed)
ORTHOPAEDIC CONSULTATION  REQUESTING PHYSICIAN: Cristal Ford, DO  PCP:  Abigail Miyamoto, MD  Chief Complaint: Left hip fracture.  HPI: [name] is a 70 y.o. female who complains of  Left hip pain following a mechanical fall.  She had a misstep while moving backwards and suffered the aforementioned injury.  No other complaints besides the hip.  Denies any associated sypmtoms of numbness or motor deficits.   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  Past Medical History:  Diagnosis Date  . Chronic cholecystitis   . Complication of anesthesia   . Diverticulosis   . Endometrial cancer ([place]) 1977   s/p TAH  . History of transfusion 1970's  . PONV (postoperative nausea and vomiting)    Past Surgical History:  Procedure Laterality Date  . ABDOMINAL HYSTERECTOMY  1977   for endometrial cancer  . LAPAROSCOPIC CHOLECYSTECTOMY SINGLE SITE WITH INTRAOPERATIVE CHOLANGIOGRAM N/A 07/19/2015   Procedure: LAPAROSCOPIC CHOLECYSTECTOMY SINGLE SITE WITH INTRAOPERATIVE CHOLANGIOGRAM;  Surgeon: Michael Boston, MD;  Location: [facility];  Service: General;  Laterality: N/A;  . TONSILLECTOMY    . TUBAL LIGATION     Social History   Social History  . Marital status: Single    Spouse name: N/A  . Number of children: N/A  . Years of education: N/A   Social History Main Topics  . Smoking status: Current Every Day Smoker  . Smokeless tobacco: Never Used  . Alcohol use No  . Drug use: No  . Sexual activity: Not Asked   Other Topics Concern  . None   Social History Narrative  . None   History reviewed. No pertinent family history. Allergies  Allergen Reactions  . Penicillins     Has patient had a PCN reaction causing immediate rash, facial/tongue/throat swelling, SOB or lightheadedness with hypotension: YES Has patient had a PCN reaction causing severe rash involving mucus membranes or skin necrosis:NO Has patient had a PCN reaction that required hospitalization  NO Has patient had a PCN reaction occurring within the last 10 years: NO If all of the above answers are "NO", then may proceed with Cephalosporin use.   Prior to Admission medications   Medication Sig Start Date End Date Taking? Authorizing Provider  bimatoprost (LUMIGAN) 0.01 % SOLN Place 1 drop into both eyes at bedtime.   Yes Historical Provider, MD  fluticasone (FLONASE) 50 MCG/ACT nasal spray Place 2 sprays into both nostrils daily. 05/25/15  Yes Historical Provider, MD  ibuprofen (ADVIL,MOTRIN) 200 MG tablet Take 400 mg by mouth every 6 (six) hours as needed for mild pain.   Yes Historical Provider, MD  Multiple Vitamin (MULTIVITAMIN WITH MINERALS) TABS tablet Take 1 tablet by mouth daily.   Yes Historical Provider, MD  SIMBRINZA 1-0.2 % SUSP Place 1 drop into both eyes 2 (two) times daily. 06/20/15  Yes Historical Provider, MD  brimonidine-timolol (COMBIGAN) 0.2-0.5 % ophthalmic solution Place 1 drop into both eyes every 12 (twelve) hours.    Historical Provider, MD  estradiol (ESTRACE) 0.5 MG tablet Take 0.5 mg by mouth daily. 06/25/15   Historical Provider, MD  oxyCODONE (OXY IR/ROXICODONE) 5 MG immediate release tablet Take 1-2 tablets (5-10 mg total) by mouth every 4 (four) hours as needed for moderate pain, severe pain or breakthrough pain. Patient not taking: Reported on 11/18/2016 07/19/15   Michael Boston, MD   Dg Chest 1 View  Result Date: 11/18/2016 CLINICAL DATA:  Pt c/o pain in left hip with limited mobility  due to fall down the steps carrying a crock pot of food. Pt reports landing on her left side. EXAM: CHEST 1 VIEW COMPARISON:  None. FINDINGS: Midline trachea. M mild cardiomegaly. Mediastinal contours otherwise within normal limits. No pleural effusion or pneumothorax. Hyperinflation. Clear lungs. IMPRESSION: Hyperinflation and mild cardiomegaly.  No acute findings. Electronically Signed   By: Abigail Miyamoto M.D.   On: 11/18/2016 16:34   Dg Hip Unilat W Or Wo Pelvis 2-3 Views  Left  Result Date: 11/18/2016 CLINICAL DATA:  Left hip pain and fell down steps. EXAM: DG HIP (WITH OR WITHOUT PELVIS) 2-3V LEFT COMPARISON:  None. FINDINGS: There is a mildly displaced and impacted fracture involving the proximal left femur. Fracture is near the junction of the left femoral head and neck. This probably represents a subcapital fracture but there may be a transcervical component. The left femoral head is located. Right hip is grossly intact. Pelvic bony ring is intact. IMPRESSION: Fracture of the proximal left femur. Electronically Signed   By: Markus Daft M.D.   On: 11/18/2016 16:36   Dg Femur Min 2 Views Left  Result Date: 11/18/2016 CLINICAL DATA:  Known left femoral neck fracture EXAM: LEFT FEMUR 2 VIEWS COMPARISON:  Films from earlier in the same day FINDINGS: There is again noted a femoral neck fracture with some impaction and angulation at the fracture site. There is suggestion of superior pubic ramus fracture as well. The more distal femur is within normal limits. No soft tissue abnormality is noted. IMPRESSION: Left femoral neck fracture stable from the prior study. Changes suggestive of superior pubic ramus fracture on the left. Electronically Signed   By: Inez Catalina M.D.   On: 11/18/2016 18:16    Positive ROS: All other systems have been reviewed and were otherwise negative with the exception of those mentioned in the HPI and as above.  Physical Exam: General: Alert, no acute distress Cardiovascular: No pedal edema Respiratory: No cyanosis, no use of accessory musculature GI: No organomegaly, abdomen is soft and non-tender Skin: No lesions in the area of chief complaint Neurologic: Sensation intact distally Psychiatric: Patient is competent for consent with normal mood and affect Lymphatic: No axillary or cervical lymphadenopathy  MUSCULOSKELETAL:  LLE- shortened and ER , +NVI , no open wounds  Assessment: Left hip femoral neck fracture Left superior ramus  fracture  Plan: -to OR today for hemiarthroplasty of the left hip -will plan on WBAT post op, with pain control -The risks, benefits, and alternatives were discussed with the patient. There are risks associated with the surgery including, but not limited to, problems with anesthesia (death), infection, differences in leg length/angulation/rotation, fracture of bones, loosening or failure of implants, malunion, nonunion, hematoma (blood accumulation) which may require surgical drainage, blood clots, pulmonary embolism, nerve injury (foot drop), and blood vessel injury. The patient understands these risks and elects to proceed. -NPO    Nicholes Stairs, MD Cell [phone]    11/18/2016 7:56 PM

## 2016-11-18 NOTE — H&P (Signed)
[facility] History and Physical  [name] [id number] DOB: [birth year] DOA: 11/18/2016  PCP: Abigail Miyamoto, MD  Patient coming from: home  Chief Complaint: Left hip pain, fall  HPI: [name] is a 70 y.o. female with a medical history of endometrial cancer, presented to the emergency department after withstanding a fall. Patient states she was trying to walk backwards when she thought she only had one step but missed a step and fell onto her left side. She denies hitting her head. Denies any dizziness prior to the fall. Patient currently denies any chest pain, shortness of breath, abdominal pain, nausea or vomiting, diarrhea constipation, dizziness or headache. Denies any recent illness or sick contacts. Patient does complain of pain in her low left hip.  ED Course: Found to have left femur fracture. Orthopedics called, [facility] asked to admit.  Review of Systems:  All other systems reviewed and are negative.   Past Medical History:  Diagnosis Date  . Chronic cholecystitis   . Complication of anesthesia   . Diverticulosis   . Endometrial cancer ([place]) 1977   s/p TAH  . History of transfusion 1970's  . PONV (postoperative nausea and vomiting)     Past Surgical History:  Procedure Laterality Date  . ABDOMINAL HYSTERECTOMY  1977   for endometrial cancer  . LAPAROSCOPIC CHOLECYSTECTOMY SINGLE SITE WITH INTRAOPERATIVE CHOLANGIOGRAM N/A 07/19/2015   Procedure: LAPAROSCOPIC CHOLECYSTECTOMY SINGLE SITE WITH INTRAOPERATIVE CHOLANGIOGRAM;  Surgeon: Michael Boston, MD;  Location: [facility];  Service: General;  Laterality: N/A;  . TONSILLECTOMY    . TUBAL LIGATION      Social History:  reports that she has been smoking.  She has never used smokeless tobacco. She reports that she does not drink alcohol or use drugs.  Allergies  Allergen Reactions  . Penicillins     No family history on file. Denies family history of CAD, diabetes, hypertension.  Prior to Admission medications     Medication Sig Start Date End Date Taking? Authorizing Provider  estradiol (ESTRACE) 0.5 MG tablet Take 0.5 mg by mouth daily. 06/25/15   Historical Provider, MD  fluticasone (FLONASE) 50 MCG/ACT nasal spray Place 2 sprays into both nostrils daily. 05/25/15   Historical Provider, MD  ibuprofen (ADVIL,MOTRIN) 200 MG tablet Take 400 mg by mouth every 6 (six) hours as needed for mild pain.    Historical Provider, MD  Multiple Vitamin (MULTIVITAMIN WITH MINERALS) TABS tablet Take 1 tablet by mouth daily.    Historical Provider, MD  oxyCODONE (OXY IR/ROXICODONE) 5 MG immediate release tablet Take 1-2 tablets (5-10 mg total) by mouth every 4 (four) hours as needed for moderate pain, severe pain or breakthrough pain. 07/19/15   Michael Boston, MD  SIMBRINZA 1-0.2 % SUSP Place 1 drop into both eyes 2 (two) times daily. 06/20/15   Historical Provider, MD    Physical Exam: Vitals:   11/18/16 1529  BP: 144/59  Pulse: 66  Resp: 16  Temp: 97.5 F (36.4 C)     General: Well developed, well nourished, NAD, appears stated age  HEENT: NCAT, PERRLA, EOMI, Anicteic Sclera, mucous membranes moist.   Neck: Supple, no JVD, no masses  Cardiovascular: S1 S2 auscultated, no rubs, murmurs or gallops. Regular rate and rhythm.  Respiratory: Clear to auscultation bilaterally with equal chest rise  Abdomen: Soft, nontender, nondistended, + bowel sounds  Extremities: warm dry without cyanosis clubbing. LLE- pain with ROM of the left hip  Neuro: AAOx3, cranial nerves grossly intact.  Skin: Without rashes  exudates or nodules  Psych: Normal affect and demeanor with intact judgement and insight  Labs on Admission: I have personally reviewed following labs and imaging studies CBC: No results for input(s): WBC, NEUTROABS, HGB, HCT, MCV, PLT in the last 168 hours. Basic Metabolic Panel: No results for input(s): NA, K, CL, CO2, GLUCOSE, BUN, CREATININE, CALCIUM, MG, PHOS in the last 168 hours. GFR: CrCl cannot be  calculated (No order found.). Liver Function Tests: No results for input(s): AST, ALT, ALKPHOS, BILITOT, PROT, ALBUMIN in the last 168 hours. No results for input(s): LIPASE, AMYLASE in the last 168 hours. No results for input(s): AMMONIA in the last 168 hours. Coagulation Profile: No results for input(s): INR, PROTIME in the last 168 hours. Cardiac Enzymes: No results for input(s): CKTOTAL, CKMB, CKMBINDEX, TROPONINI in the last 168 hours. BNP (last 3 results) No results for input(s): PROBNP in the last 8760 hours. HbA1C: No results for input(s): HGBA1C in the last 72 hours. CBG: No results for input(s): GLUCAP in the last 168 hours. Lipid Profile: No results for input(s): CHOL, HDL, LDLCALC, TRIG, CHOLHDL, LDLDIRECT in the last 72 hours. Thyroid Function Tests: No results for input(s): TSH, T4TOTAL, FREET4, T3FREE, THYROIDAB in the last 72 hours. Anemia Panel: No results for input(s): VITAMINB12, FOLATE, FERRITIN, TIBC, IRON, RETICCTPCT in the last 72 hours. Urine analysis: No results found for: COLORURINE, APPEARANCEUR, LABSPEC, PHURINE, GLUCOSEU, HGBUR, BILIRUBINUR, KETONESUR, PROTEINUR, UROBILINOGEN, NITRITE, LEUKOCYTESUR Sepsis Labs: @LABRCNTIP (procalcitonin:4,lacticidven:4) )No results found for this or any previous visit (from the past 240 hour(s)).   Radiological Exams on Admission: Dg Chest 1 View  Result Date: 11/18/2016 CLINICAL DATA:  Pt c/o pain in left hip with limited mobility due to fall down the steps carrying a crock pot of food. Pt reports landing on her left side. EXAM: CHEST 1 VIEW COMPARISON:  None. FINDINGS: Midline trachea. M mild cardiomegaly. Mediastinal contours otherwise within normal limits. No pleural effusion or pneumothorax. Hyperinflation. Clear lungs. IMPRESSION: Hyperinflation and mild cardiomegaly.  No acute findings. Electronically Signed   By: Abigail Miyamoto M.D.   On: 11/18/2016 16:34   Dg Hip Unilat W Or Wo Pelvis 2-3 Views Left  Result Date:  11/18/2016 CLINICAL DATA:  Left hip pain and fell down steps. EXAM: DG HIP (WITH OR WITHOUT PELVIS) 2-3V LEFT COMPARISON:  None. FINDINGS: There is a mildly displaced and impacted fracture involving the proximal left femur. Fracture is near the junction of the left femoral head and neck. This probably represents a subcapital fracture but there may be a transcervical component. The left femoral head is located. Right hip is grossly intact. Pelvic bony ring is intact. IMPRESSION: Fracture of the proximal left femur. Electronically Signed   By: Markus Daft M.D.   On: 11/18/2016 16:36    EKG: None  Assessment/Plan Left proximal femur fracture -Secondary to mechanical fall -CBC and CMP pending -X-ray shows fracture of the proximal left femur -Orthopedics consulted and appreciated, Dr. Stann Mainland -Plan for surgery today -Will obtain EKG -Patient low-mod risk for surgery -Continue pain control  Tobacco abuse -Smoking cessation discussed -Nicotine patch  DVT prophylaxis: SCDs  Code Status: Full  Family Communication: Family at bedside Admission, patients condition and plan of care including tests being ordered have been discussed with the patient and family who indicate understanding and agree with the plan and Code Status.  Disposition Plan: Admitted. TBD  Consults called: Orthopedics, Dr. Stann Mainland  Admission status: Inpatient    Time spent: 60 minutes  Aryssa Rosamond D.O. [facility] Pager [phone]  If 7PM-7AM, please contact night-coverage [email or web address] Password [email or web address] 11/18/2016, 5:14 PM

## 2016-11-18 NOTE — Discharge Instructions (Signed)
Ok for full weight bearing as tolerated to the left leg Comply with posterior hip precautions Follow up with Dr. Stann Mainland in the office in 2 weeks For DVT prophylaxis take a 325 mg aspirin twice daily for 4 weeks -maintain postoperative bandage in place until follow up -ok to shower with bandage in place, but do not submerge under water.

## 2016-11-18 NOTE — ED Notes (Signed)
Patient transported to X-ray 

## 2016-11-18 NOTE — ED Provider Notes (Addendum)
[place] DEPT Provider Note   CSN: [id number] Arrival date & time: 11/18/16  1512     History   Chief Complaint Chief Complaint  Patient presents with  . Fall    HPI [name] is a 70 y.o. female.  This a 70 year old female who was going up the stairs when she forgot something she thought she was only in the first step.  She stepped backwards as she turned and realized she was up 2 steps, falling onto her left side.  She was unable to get up on her own.  She denies hitting her head or loss of consciousness. Her grandsons arrived and assisted her to a standing position.  She could not bear weight on her left leg.  EMS came, evaluated and transported Has no history of previous injury to the left hip      Past Medical History:  Diagnosis Date  . Chronic cholecystitis   . Complication of anesthesia   . Diverticulosis   . Endometrial cancer ([place]) 1977   s/p TAH  . History of transfusion 1970's  . PONV (postoperative nausea and vomiting)     Patient Active Problem List   Diagnosis Date Noted  . Closed fracture of left hip ([place]) 11/18/2016  . Tobacco abuse   . Chronic cholecystitis s/p lap chole 07/19/2015 07/19/2015    Past Surgical History:  Procedure Laterality Date  . ABDOMINAL HYSTERECTOMY  1977   for endometrial cancer  . LAPAROSCOPIC CHOLECYSTECTOMY SINGLE SITE WITH INTRAOPERATIVE CHOLANGIOGRAM N/A 07/19/2015   Procedure: LAPAROSCOPIC CHOLECYSTECTOMY SINGLE SITE WITH INTRAOPERATIVE CHOLANGIOGRAM;  Surgeon: Michael Boston, MD;  Location: [facility];  Service: General;  Laterality: N/A;  . TONSILLECTOMY    . TUBAL LIGATION      OB History    No data available       Home Medications    Prior to Admission medications   Medication Sig Start Date End Date Taking? Authorizing Provider  bimatoprost (LUMIGAN) 0.01 % SOLN Place 1 drop into both eyes at bedtime.   Yes Historical Provider, MD  fluticasone (FLONASE) 50 MCG/ACT nasal spray Place 2 sprays into both  nostrils daily. 05/25/15  Yes Historical Provider, MD  ibuprofen (ADVIL,MOTRIN) 200 MG tablet Take 400 mg by mouth every 6 (six) hours as needed for mild pain.   Yes Historical Provider, MD  Multiple Vitamin (MULTIVITAMIN WITH MINERALS) TABS tablet Take 1 tablet by mouth daily.   Yes Historical Provider, MD  SIMBRINZA 1-0.2 % SUSP Place 1 drop into both eyes 2 (two) times daily. 06/20/15  Yes Historical Provider, MD  brimonidine-timolol (COMBIGAN) 0.2-0.5 % ophthalmic solution Place 1 drop into both eyes every 12 (twelve) hours.    Historical Provider, MD  estradiol (ESTRACE) 0.5 MG tablet Take 0.5 mg by mouth daily. 06/25/15   Historical Provider, MD  oxyCODONE (OXY IR/ROXICODONE) 5 MG immediate release tablet Take 1-2 tablets (5-10 mg total) by mouth every 4 (four) hours as needed for moderate pain, severe pain or breakthrough pain. Patient not taking: Reported on 11/18/2016 07/19/15   Michael Boston, MD    Family History No family history on file.  Social History Social History  Substance Use Topics  . Smoking status: Current Every Day Smoker  . Smokeless tobacco: Never Used  . Alcohol use No     Allergies   Penicillins   Review of Systems Review of Systems  Cardiovascular: Negative for chest pain and leg swelling.  Musculoskeletal: Positive for arthralgias.  Skin: Positive for wound.  Neurological:  Negative for dizziness and headaches.  All other systems reviewed and are negative.    Physical Exam Updated Vital Signs BP 131/67 (BP Location: Right Arm)   Pulse 89   Temp 98.3 F (36.8 C)   Resp 18   Ht 5\' 4"  (1.626 m)   Wt 65.3 kg   SpO2 92%   BMI 24.72 kg/m   Physical Exam  Constitutional: She appears well-developed and well-nourished.  HENT:  Head: Normocephalic and atraumatic.  Neck: Normal range of motion.  Cardiovascular: Normal rate.   Musculoskeletal: She exhibits tenderness.       Left hip: She exhibits decreased range of motion and tenderness. She exhibits  no swelling, no crepitus and no deformity.       Arms:      Legs: Neurological: She is alert.  Skin: Skin is warm and dry.  Nursing note and vitals reviewed.    ED Treatments / Results  Labs (all labs ordered are listed, but only abnormal results are displayed) Labs Reviewed  CBC WITH DIFFERENTIAL/PLATELET - Abnormal; Notable for the following:       Result Value   Neutro Abs 9.0 (*)    All other components within normal limits  COMPREHENSIVE METABOLIC PANEL - Abnormal; Notable for the following:    Glucose, Bld 116 (*)    Calcium 8.6 (*)    Total Protein 6.0 (*)    All other components within normal limits  PROTIME-INR  TYPE AND SCREEN  ABO/RH    EKG  EKG Interpretation None       Radiology Dg Chest 1 View  Result Date: 11/18/2016 CLINICAL DATA:  Pt c/o pain in left hip with limited mobility due to fall down the steps carrying a crock pot of food. Pt reports landing on her left side. EXAM: CHEST 1 VIEW COMPARISON:  None. FINDINGS: Midline trachea. M mild cardiomegaly. Mediastinal contours otherwise within normal limits. No pleural effusion or pneumothorax. Hyperinflation. Clear lungs. IMPRESSION: Hyperinflation and mild cardiomegaly.  No acute findings. Electronically Signed   By: Abigail Miyamoto M.D.   On: 11/18/2016 16:34   Dg Hip Unilat W Or Wo Pelvis 2-3 Views Left  Result Date: 11/18/2016 CLINICAL DATA:  Left hip pain and fell down steps. EXAM: DG HIP (WITH OR WITHOUT PELVIS) 2-3V LEFT COMPARISON:  None. FINDINGS: There is a mildly displaced and impacted fracture involving the proximal left femur. Fracture is near the junction of the left femoral head and neck. This probably represents a subcapital fracture but there may be a transcervical component. The left femoral head is located. Right hip is grossly intact. Pelvic bony ring is intact. IMPRESSION: Fracture of the proximal left femur. Electronically Signed   By: Markus Daft M.D.   On: 11/18/2016 16:36     Procedures Procedures (including critical care time)  Medications Ordered in ED Medications  nicotine (NICODERM CQ - dosed in mg/24 hours) patch 21 mg (21 mg Transdermal Patch Applied 11/18/16 1717)  fentaNYL (SUBLIMAZE) injection 50 mcg (50 mcg Intravenous Given 11/18/16 1541)  0.9 %  sodium chloride infusion (150 mL/hr Intravenous Transfusing/Transfer 11/18/16 1727)  fentaNYL (SUBLIMAZE) injection 50 mcg (50 mcg Intravenous Given 11/18/16 1756)     Initial Impression / Assessment and Plan / ED Course  I have reviewed the triage vital signs and the nursing notes.  Pertinent labs & imaging results that were available during my care of the patient were reviewed by me and considered in my medical decision making (see chart for details).  Clinical Course    X-ray reviewed.  Femoral neck fracture.  Will consult orthopedics Orthopedics will take patient to the OR tonight.  She will be needed to the hospitalist service   Final Clinical Impressions(s) / ED Diagnoses   Final diagnoses:  Closed fracture of left hip, initial encounter [facility])    New Prescriptions New Prescriptions   No medications on file     Junius Creamer, NP 11/18/16 [place] Yao, MD 11/18/16 [place], NP 11/18/16 [place] Yao, MD 11/19/16 [phone]

## 2016-11-18 NOTE — Op Note (Signed)
ARTHROPLASTY BIPOLAR HIP (HEMIARTHROPLASTY)    Pre-op Diagnosis: left hip fracture     Post-op Diagnosis: same   Operative Procedures  1. Open treatment of femoral fracture, proximal end, neck, prosthetic replacement CPT 27236  Personnel  Surgeon(s): Nicholes Stairs, MD   Anesthesia: General  Prosthesis: Depuy Femur: 11 Corail KLA Head: +1.5 mm size: 46 Bearing Type: Bipolar  Date of Service: 11/18/2016  Indication: [name] is a 70 y.o. year old female who suffered a ground level fall and was found to have sustained a Left hip fracture. The patient was found to have a hip fracture that was appropriate for operative management. We reviewed the risk and benefits with the patient and family and they elected to proceed.  Procedure: After informed consent was obtained and understanding of the risk were voiced including but not limited to bleeding, infection, damage to surrounding structures including nerves and vessels, blood clots, leg length inequality, dislocation and the failure to achieve desired results, including death, the operative extremity was marked with verbal confirmation of the patient in the holding area.   The patient was then brought to the operating room and transported to the operating room table and placed in the lateral decubitus position. The operative limb was then prepped and draped in the usual sterile fashion and preoperative antibiotics were administered.  A time out was performed prior to the start of surgery confirming the correct extremity, preoperative antibiotic administration, as well as team members, and that implants and instruments available for the case. Correct surgical site was also confirmed with preoperative radiographs. A standard posterior approach to the hip was performed. A capsulotomy was performed and the capsule tagged for later repair. The labrum was preserved. The hip was dislocated and the femoral neck cut was made at  approximately 1.5 cm above the level of the lesser trochanter using the femoral neck cutting guide. We then used a corkscrew and cobb to remove the femoral head from the acetabulum. We measured the head and proceeded to trial head sizes and found 72mm to the the appropriate size. We then turned our attention to femoral preparation. We began sequential broaching to a size 11 stem. This size produced good fit and rotational stability. We placed the trial neck and a 1.5 mm 46 head.  Leg lengths were evaluated on the table. The hip was stable in extension and external rotation without impingement. The hip was stable in deep flexion. In 90 degrees of flexion and neutral abduction the hip was stable to 80 degrees of internal rotation. In a position of sleep with hip adducted across the body the hip was stable to 80 degrees of rotation.  We then turned back to the femur and tested the broach for stability and fit, and removed the trial broach. The final femoral implant was placed, and the trial head was again trialed for stability. We then irrigated and dried the trunion, and the final head was placed. We placed an 3mm Bipolar head. The hip was again reduced, and taken through a range of motion and found to be stable as above. The hip was thoroughly irrigated, and a posterior capsular repair was performed with #2 Ethibond. The wound was irrigated with normal saline. The deep fascia was closed with 1 V lock in the IT band and 0 vicryl for the deep fat layer, and 2.0 Monocryl for the subcutaneous tissue. The skin was closed with staples. A sterile dressing was applied. The patient was awakened and transported to  the recovery room in stable condition. All sponge, needle, and instrument counts were correct at the end of the case.  Position: lateral decubitus   Complications: none.  Time Out: performed   Drains/Packing: none  Estimated blood loss: 200 cc  Returned to Recovery Room: in good condition.    Antibiotics: yes     Fluid Replacement  Crystalloid: see anesthesia record Blood: none  FFP: none   Specimens Removed: 1 to pathology   Sponge and Instrument Count Correct? yes   PACU: portable radiograph - low AP pelvis  Admission: inpatient status, start PT & OT POD#1  Postoperative plan for [facility] : Return in 2 weeks for wound check.  Weight Bearing/Load Lower Extremity: full  Posterior hip precautions Mechanical VTE (DVT) Prophylaxis: sequential compression devices, TED thigh-high  Chemical VTE (DVT) Prophylaxis: Lovenox while in house, then home on bid full strength asa  Geralynn Rile, MD [phone] [facility] 10:37 PM

## 2016-11-18 NOTE — Anesthesia Procedure Notes (Signed)
Procedure Name: Intubation Date/Time: 11/18/2016 8:40 PM Performed by: Ollen Bowl Pre-anesthesia Checklist: Patient identified, Emergency Drugs available, Suction available, Patient being monitored and Timeout performed Patient Re-evaluated:Patient Re-evaluated prior to inductionOxygen Delivery Method: Circle system utilized and Simple face mask Preoxygenation: Pre-oxygenation with 100% oxygen Intubation Type: IV induction Ventilation: Mask ventilation without difficulty Laryngoscope Size: Miller and 2 Grade View: Grade I Tube type: Oral Tube size: 7.5 mm Airway Equipment and Method: Patient positioned with wedge pillow and Stylet Placement Confirmation: ETT inserted through vocal cords under direct vision,  positive ETCO2 and breath sounds checked- equal and bilateral Secured at: 21 cm Tube secured with: Tape Dental Injury: Teeth and Oropharynx as per pre-operative assessment

## 2016-11-18 NOTE — ED Notes (Signed)
Attempted to call report

## 2016-11-18 NOTE — Transfer of Care (Signed)
Immediate Anesthesia Transfer of Care Note  Patient: [name]  Procedure(s) Performed: Procedure(s): ARTHROPLASTY BIPOLAR HIP (HEMIARTHROPLASTY) (Left)  Patient Location: PACU  Anesthesia Type:General  Level of Consciousness: awake and alert   Airway & Oxygen Therapy: Patient Spontanous Breathing and Patient connected to nasal cannula oxygen  Post-op Assessment: Report given to RN and Post -op Vital signs reviewed and stable  Post vital signs: Reviewed and stable  Last Vitals:  Vitals:   11/18/16 1949 11/18/16 2230  BP: (!) 143/69   Pulse: 92   Resp: 16   Temp: 37.3 C (P) 36.7 C    Last Pain:  Vitals:   11/18/16 1949  TempSrc: Oral  PainSc:          Complications: No apparent anesthesia complications

## 2016-11-18 NOTE — Anesthesia Postprocedure Evaluation (Signed)
Anesthesia Post Note  Patient: [name]  Procedure(s) Performed: Procedure(s) (LRB): ARTHROPLASTY BIPOLAR HIP (HEMIARTHROPLASTY) (Left)  Anesthesia Post Evaluation     Last Vitals:  Vitals:   11/18/16 1949 11/18/16 2230  BP: (!) 143/69   Pulse: 92   Resp: 16   Temp: 37.3 C 36.7 C    Last Pain:  Vitals:   11/18/16 2245  TempSrc:   PainSc: 6                  Emogene Muratalla,W. EDMOND

## 2016-11-18 NOTE — Anesthesia Postprocedure Evaluation (Signed)
Anesthesia Post Note  Patient: [name]  Procedure(s) Performed: Procedure(s) (LRB): ARTHROPLASTY BIPOLAR HIP (HEMIARTHROPLASTY) (Left)  Patient location during evaluation: PACU Anesthesia Type: General Level of consciousness: awake and alert Pain management: pain level controlled Vital Signs Assessment: post-procedure vital signs reviewed and stable Respiratory status: spontaneous breathing, nonlabored ventilation, respiratory function stable and patient connected to nasal cannula oxygen Cardiovascular status: blood pressure returned to baseline and stable Postop Assessment: no signs of nausea or vomiting Anesthetic complications: no       Last Vitals:  Vitals:   11/18/16 1949 11/18/16 2230  BP: (!) 143/69   Pulse: 92   Resp: 16   Temp: 37.3 C 36.7 C    Last Pain:  Vitals:   11/18/16 2245  TempSrc:   PainSc: 6                  Mahmood Boehringer,W. EDMOND

## 2016-11-18 NOTE — ED Triage Notes (Addendum)
Pt arrives [facility] from home where she fell down 3 steps into carpeted basement. Pt c/o pain at left hip. Now LOC no other compaints. 123mcg fentanyl by EMS PTA.

## 2016-11-18 NOTE — Anesthesia Preprocedure Evaluation (Addendum)
Anesthesia Evaluation  Patient identified by MRN, date of birth, ID band Patient awake    Reviewed: Allergy & Precautions, H&P , NPO status , Patient's Chart, lab work & pertinent test results  History of Anesthesia Complications (+) PONV  Airway Mallampati: II  TM Distance: >3 FB Neck ROM: Full    Dental no notable dental hx. (+) Upper Dentures, Lower Dentures, Dental Advisory Given   Pulmonary Current Smoker,    Pulmonary exam normal breath sounds clear to auscultation       Cardiovascular negative cardio ROS   Rhythm:Regular Rate:Normal     Neuro/Psych negative neurological ROS  negative psych ROS   GI/Hepatic negative GI ROS, Neg liver ROS,   Endo/Other  negative endocrine ROS  Renal/GU negative Renal ROS  negative genitourinary   Musculoskeletal   Abdominal   Peds  Hematology negative hematology ROS (+)   Anesthesia Other Findings   Reproductive/Obstetrics negative OB ROS                            Anesthesia Physical Anesthesia Plan  ASA: II  Anesthesia Plan: General   Post-op Pain Management:    Induction: Intravenous  Airway Management Planned: Oral ETT  Additional Equipment:   Intra-op Plan:   Post-operative Plan: Extubation in OR  Informed Consent: I have reviewed the patients History and Physical, chart, labs and discussed the procedure including the risks, benefits and alternatives for the proposed anesthesia with the patient or authorized representative who has indicated his/her understanding and acceptance.   Dental advisory given  Plan Discussed with: CRNA and Surgeon  Anesthesia Plan Comments:        Anesthesia Quick Evaluation

## 2016-11-18 NOTE — ED Notes (Signed)
To x-ray

## 2016-11-19 DIAGNOSIS — D649 Anemia, unspecified: Secondary | ICD-10-CM

## 2016-11-19 DIAGNOSIS — F17200 Nicotine dependence, unspecified, uncomplicated: Secondary | ICD-10-CM

## 2016-11-19 LAB — BASIC METABOLIC PANEL
Anion gap: 5 (ref 5–15)
BUN: 13 mg/dL (ref 6–20)
CHLORIDE: 102 mmol/L (ref 101–111)
CO2: 29 mmol/L (ref 22–32)
Calcium: 8.6 mg/dL — ABNORMAL LOW (ref 8.9–10.3)
Creatinine, Ser: 0.63 mg/dL (ref 0.44–1.00)
GFR calc Af Amer: >60 mL/min (ref >60)
GFR calc non Af Amer: >60 mL/min (ref >60)
GLUCOSE: 173 mg/dL — AB (ref 65–99)
POTASSIUM: 3.5 mmol/L (ref 3.5–5.1)
Sodium: 136 mmol/L (ref 135–145)

## 2016-11-19 LAB — CBC
HEMATOCRIT: 34.9 % — AB (ref 36.0–46.0)
HEMOGLOBIN: 11.8 g/dL — AB (ref 12.0–15.0)
MCH: 29.9 pg (ref 26.0–34.0)
MCHC: 33.8 g/dL (ref 30.0–36.0)
MCV: 88.4 fL (ref 78.0–100.0)
PLATELETS: 193 10*3/uL (ref 150–400)
RBC: 3.95 MIL/uL (ref 3.87–5.11)
RDW: 12.5 % (ref 11.5–15.5)
WBC: 7.8 10*3/uL (ref 4.0–10.5)

## 2016-11-19 MED ORDER — NICOTINE 21 MG/24HR TD PT24
21.0000 mg | MEDICATED_PATCH | TRANSDERMAL | Status: DC
Start: 2016-11-19 — End: 2016-11-20
  Administered 2016-11-19: 21 mg via TRANSDERMAL
  Filled 2016-11-19: qty 1

## 2016-11-19 MED ORDER — CALCIUM CARBONATE ANTACID 500 MG PO CHEW
400.0000 mg | CHEWABLE_TABLET | Freq: Three times a day (TID) | ORAL | Status: DC | PRN
  Administered 2016-11-19: 400 mg via ORAL
  Filled 2016-11-19: qty 2

## 2016-11-19 MED ORDER — LATANOPROST 0.005 % OP SOLN
1.0000 [drp] | Freq: Every day | OPHTHALMIC | Status: DC
  Filled 2016-11-19: qty 2.5

## 2016-11-19 NOTE — Evaluation (Signed)
Physical Therapy Evaluation Patient Details Name: Osa Schrag MRN: [id number] DOB: 08-Aug-1946 Today's Date: 11/19/2016   History of Present Illness  s/p fall with L hip fx. underwent L posterior hemiarthroplasty.   Clinical Impression  Patient presents with mild dependencies in gait and mobility due to hip fx.  Patient did well for 1st time up after fracture and anticipate steady progress.  Patient will benefit from continued PT to progress mobility and independence for d/c home.    Follow Up Recommendations Home health PT;Supervision - Intermittent    Equipment Recommendations  Rolling walker with 5" wheels;3in1 (PT)    Recommendations for Other Services       Precautions / Restrictions Precautions Precautions: Posterior Hip;Fall Precaution Booklet Issued: Yes (comment) Restrictions Weight Bearing Restrictions: Yes LLE Weight Bearing: Weight bearing as tolerated      Mobility  Bed Mobility Overal bed mobility: Needs Assistance Bed Mobility: Supine to Sit     Supine to sit: Min assist     General bed mobility comments: cueing for sequencing  Transfers Overall transfer level: Needs assistance Equipment used: Rolling walker (2 wheeled) Transfers: Sit to/from Stand Sit to Stand: Min assist         General transfer comment: cueing for hand placement and sequencing  Ambulation/Gait Ambulation/Gait assistance: Min assist Ambulation Distance (Feet): 40 Feet Assistive device: Rolling walker (2 wheeled) Gait Pattern/deviations: Step-to pattern;Decreased step length - right;Decreased stride length;Decreased stance time - left;Decreased weight shift to left Gait velocity: decreased      Stairs            Wheelchair Mobility    Modified Rankin (Stroke Patients Only)       Balance Overall balance assessment: Needs assistance Sitting-balance support: No upper extremity supported;Feet supported Sitting balance-Leahy Scale: Good     Standing balance  support: Bilateral upper extremity supported Standing balance-Leahy Scale: Poor Standing balance comment: reliant on RW                             Pertinent Vitals/Pain Pain Assessment: 0-10 Pain Score: 6  Faces Pain Scale: Hurts a little bit Pain Location: L hip Pain Descriptors / Indicators: Discomfort;Grimacing Pain Intervention(s): Limited activity within patient's tolerance;Monitored during session;Patient requesting pain meds-RN notified    Home Living Family/patient expects to be discharged to:: Private residence Living Arrangements: Alone (family can assist) Available Help at Discharge: Family;Available 24 hours/day Type of Home: House       Home Layout: Two level;Bed/bath upstairs Home Equipment: None      Prior Function Level of Independence: Independent         Comments: fell carrying a crock pot downstairs     Hand Dominance   Dominant Hand: Right    Extremity/Trunk Assessment   Upper Extremity Assessment Upper Extremity Assessment: Defer to OT evaluation    Lower Extremity Assessment Lower Extremity Assessment: LLE deficits/detail LLE Deficits / Details: not fully assess due to precautions; knee and ankle at least 3/5    Cervical / Trunk Assessment Cervical / Trunk Assessment: Normal  Communication   Communication: No difficulties  Cognition Arousal/Alertness: Awake/alert Behavior During Therapy: WFL for tasks assessed/performed Overall Cognitive Status: Within Functional Limits for tasks assessed                 General Comments: difficulty remembering hip precuations. most likely due to meds and not sleeping last night    General Comments      Exercises  Total Joint Exercises Ankle Circles/Pumps: AROM;Both;10 reps;Seated Quad Sets: AROM;Both;10 reps;Seated   Assessment/Plan    PT Assessment Patient needs continued PT services  PT Problem List Decreased strength;Decreased activity tolerance;Decreased  balance;Decreased mobility;Decreased knowledge of use of DME;Decreased knowledge of precautions          PT Treatment Interventions DME instruction;Gait training;Stair training;Functional mobility training;Therapeutic activities;Therapeutic exercise;Balance training;Patient/family education    PT Goals (Current goals can be found in the Care Plan section)  Acute Rehab PT Goals Patient Stated Goal: to go home PT Goal Formulation: With patient/family Time For Goal Achievement: 11/24/16 Potential to Achieve Goals: Good    Frequency Min 5X/week   Barriers to discharge        Co-evaluation               End of Session Equipment Utilized During Treatment: Gait belt Activity Tolerance: Patient tolerated treatment well Patient left: in chair;with call bell/phone within reach;with family/visitor present Nurse Communication: Mobility status         Time: 1330-1400 PT Time Calculation (min) (ACUTE ONLY): 30 min   Charges:   PT Evaluation $PT Eval Moderate Complexity: 1 Procedure PT Treatments $Gait Training: 8-22 mins   PT G CodesShanna Cisco 11/19/2016, 2:52 PM  11/19/2016 [place], PT [phone]

## 2016-11-19 NOTE — Progress Notes (Signed)
PROGRESS NOTE    Vernecia Lazer  [id number] DOB: 1946/01/07 DOA: 11/18/2016 PCP: Abigail Miyamoto, MD   Chief Complaint  Patient presents with  . Fall    Brief Narrative:  HPI on 11/18/2016 [name] is a 70 y.o. female with a medical history of endometrial cancer, presented to the emergency department after withstanding a fall. Patient states she was trying to walk backwards when she thought she only had one step but missed a step and fell onto her left side. She denies hitting her head. Denies any dizziness prior to the fall. Patient currently denies any chest pain, shortness of breath, abdominal pain, nausea or vomiting, diarrhea constipation, dizziness or headache. Denies any recent illness or sick contacts. Patient does complain of pain in her low left hip Assessment & Plan   Left proximal femur fracture -Secondary to mechanical fall -X-ray shows fracture of the proximal left femur -Orthopedics consulted and appreciated, Dr. Stann Mainland -S/p Open treatment of femoral fracture, proximal end, neck, prosthetic replacement  -Continue pain control -PT and OT consulted and appreciated -Continue pain control  Tobacco abuse -Smoking cessation discussed -Nicotine patch  Anemia secondary to blood loss -Possibly secondary to recent surgery -Continue to monitor CBC  DVT Prophylaxis  SCDs/Lovenox  Code Status: Full  Family Communication: None at bedside  Disposition Plan: Admitted. TBD  Consultants Orthopedics  Procedures  Open treatment of femoral fracture, proximal end, neck, prosthetic replacement   Antibiotics   Anti-infectives    Start     Dose/Rate Route Frequency Ordered Stop   11/19/16 0600  ceFAZolin (ANCEF) IVPB 2g/100 mL premix  Status:  Discontinued     2 g 200 mL/hr over 30 Minutes Intravenous On call to O.R. 11/18/16 1831 11/18/16 1945   11/19/16 0200  ceFAZolin (ANCEF) IVPB 2g/100 mL premix     2 g 200 mL/hr over 30 Minutes Intravenous Every 6 hours 11/18/16  2347 11/19/16 1959      Subjective:   [name] seen and examined today.  Complains of some pain.  Wants to go home.  Denies chest pain, shortness of breath, abdominal pain, N/V/D/C.     Objective:   Vitals:   11/18/16 2315 11/18/16 2329 11/18/16 2346 11/19/16 0420  BP: 137/64 138/72 137/62 (!) 116/55  Pulse: 81 71 87 91  Resp: 17 16 16 16   Temp: 97.7 F (36.5 C)  99 F (37.2 C) 98.2 F (36.8 C)  TempSrc:   Oral Oral  SpO2: 100% 100% 98% 93%  Weight:      Height:        Intake/Output Summary (Last 24 hours) at 11/19/16 1023 Last data filed at 11/19/16 0349  Gross per 24 hour  Intake             1100 ml  Output              200 ml  Net              900 ml   Filed Weights   11/18/16 1528  Weight: 65.3 kg (144 lb)    Exam  General: Well developed, well nourished, NAD, appears stated age  HEENT: NCAT, mucous membranes moist.   Cardiovascular: S1 S2 auscultated, no rubs, murmurs or gallops. Regular rate and rhythm.  Respiratory: Clear to auscultation bilaterally with equal chest rise  Abdomen: Soft, nontender, nondistended, + bowel sounds  Extremities: warm dry without cyanosis clubbing. Chronic edema RLE (non pitting). Left hip dressing in place.  Neuro: AAOx3, nonfocal  Psych: Normal affect and demeanor with intact judgement and insight   Data Reviewed: I have personally reviewed following labs and imaging studies  CBC:  Recent Labs Lab 11/18/16 1652 11/19/16 0451  WBC 10.5 7.8  NEUTROABS 9.0*  --   HGB 13.0 11.8*  HCT 37.3 34.9*  MCV 87.8 88.4  PLT 220 0000000   Basic Metabolic Panel:  Recent Labs Lab 11/18/16 1652 11/19/16 0451  NA 138 136  K 3.6 3.5  CL 106 102  CO2 25 29  GLUCOSE 116* 173*  BUN 19 13  CREATININE 0.62 0.63  CALCIUM 8.6* 8.6*   GFR: Estimated Creatinine Clearance: 54.9 mL/min (by C-G formula based on SCr of 0.63 mg/dL). Liver Function Tests:  Recent Labs Lab 11/18/16 1652  AST 17  ALT 16  ALKPHOS 89  BILITOT  0.4  PROT 6.0*  ALBUMIN 3.5   No results for input(s): LIPASE, AMYLASE in the last 168 hours. No results for input(s): AMMONIA in the last 168 hours. Coagulation Profile:  Recent Labs Lab 11/18/16 1652  INR 0.95   Cardiac Enzymes: No results for input(s): CKTOTAL, CKMB, CKMBINDEX, TROPONINI in the last 168 hours. BNP (last 3 results) No results for input(s): PROBNP in the last 8760 hours. HbA1C: No results for input(s): HGBA1C in the last 72 hours. CBG: No results for input(s): GLUCAP in the last 168 hours. Lipid Profile: No results for input(s): CHOL, HDL, LDLCALC, TRIG, CHOLHDL, LDLDIRECT in the last 72 hours. Thyroid Function Tests: No results for input(s): TSH, T4TOTAL, FREET4, T3FREE, THYROIDAB in the last 72 hours. Anemia Panel: No results for input(s): VITAMINB12, FOLATE, FERRITIN, TIBC, IRON, RETICCTPCT in the last 72 hours. Urine analysis: No results found for: COLORURINE, APPEARANCEUR, LABSPEC, PHURINE, GLUCOSEU, HGBUR, BILIRUBINUR, KETONESUR, PROTEINUR, UROBILINOGEN, NITRITE, LEUKOCYTESUR Sepsis Labs: @LABRCNTIP (procalcitonin:4,lacticidven:4)  ) Recent Results (from the past 240 hour(s))  Surgical pcr screen     Status: None   Collection Time: 11/18/16  6:41 PM  Result Value Ref Range Status   MRSA, PCR NEGATIVE NEGATIVE Final   Staphylococcus aureus NEGATIVE NEGATIVE Final    Comment:        The Xpert SA Assay (FDA approved for NASAL specimens in patients over 78 years of age), is one component of a comprehensive surveillance program.  Test performance has been validated by [facility] for patients greater than or equal to 26 year old. It is not intended to diagnose infection nor to guide or monitor treatment.       Radiology Studies: Dg Chest 1 View  Result Date: 11/18/2016 CLINICAL DATA:  Pt c/o pain in left hip with limited mobility due to fall down the steps carrying a crock pot of food. Pt reports landing on her left side. EXAM: CHEST 1 VIEW  COMPARISON:  None. FINDINGS: Midline trachea. M mild cardiomegaly. Mediastinal contours otherwise within normal limits. No pleural effusion or pneumothorax. Hyperinflation. Clear lungs. IMPRESSION: Hyperinflation and mild cardiomegaly.  No acute findings. Electronically Signed   By: Abigail Miyamoto M.D.   On: 11/18/2016 16:34   Dg Pelvis Portable  Result Date: 11/18/2016 CLINICAL DATA:  Hip fracture, status post hip replacement EXAM: PORTABLE PELVIS 1-2 VIEWS COMPARISON:  Left femur radiograph 11/18/2016 FINDINGS: Status post left hip hemiarthroplasty. The femoral stem is well seated. No hardware adverse features. No periprosthetic fracture. There is expected soft tissue gas in the adjacent 5. IMPRESSION: Status post left hip hemiarthroplasty without adverse features. Electronically Signed   By: Ulyses Jarred M.D.   On: 11/18/2016  23:38   Dg Hip Unilat W Or Wo Pelvis 2-3 Views Left  Result Date: 11/18/2016 CLINICAL DATA:  Left hip pain and fell down steps. EXAM: DG HIP (WITH OR WITHOUT PELVIS) 2-3V LEFT COMPARISON:  None. FINDINGS: There is a mildly displaced and impacted fracture involving the proximal left femur. Fracture is near the junction of the left femoral head and neck. This probably represents a subcapital fracture but there may be a transcervical component. The left femoral head is located. Right hip is grossly intact. Pelvic bony ring is intact. IMPRESSION: Fracture of the proximal left femur. Electronically Signed   By: Markus Daft M.D.   On: 11/18/2016 16:36   Dg Femur Min 2 Views Left  Result Date: 11/18/2016 CLINICAL DATA:  Known left femoral neck fracture EXAM: LEFT FEMUR 2 VIEWS COMPARISON:  Films from earlier in the same day FINDINGS: There is again noted a femoral neck fracture with some impaction and angulation at the fracture site. There is suggestion of superior pubic ramus fracture as well. The more distal femur is within normal limits. No soft tissue abnormality is noted.  IMPRESSION: Left femoral neck fracture stable from the prior study. Changes suggestive of superior pubic ramus fracture on the left. Electronically Signed   By: Inez Catalina M.D.   On: 11/18/2016 18:16     Scheduled Meds: . acetaminophen  1,000 mg Oral Q6H  .  ceFAZolin (ANCEF) IV  2 g Intravenous Q6H  . docusate sodium  100 mg Oral BID  . enoxaparin (LOVENOX) injection  40 mg Subcutaneous Daily  . latanoprost  1 drop Both Eyes QHS  . multivitamin with minerals  1 tablet Oral Daily  . nicotine  21 mg Transdermal Once  . oxyCODONE      . senna  1 tablet Oral BID   Continuous Infusions:   LOS: 1 day   Time Spent in minutes   30 minutes  Hawley Pavia D.O. on 11/19/2016 at 10:23 AM  Between 7am to 7pm - Pager - [phone]  After 7pm go to [email or web address] - password [email or web address]  And look for the night coverage person covering for me after hours  [facility] Office  [phone]

## 2016-11-19 NOTE — Progress Notes (Signed)
Occupational Therapy Evaluation Patient Details Name: [name] MRN: [id number] DOB: [birth year] Today's Date: 11/19/2016    History of Present Illness s/p fall with L hip fx. underwent L posterior hemiarthroplasty.    Clinical Impression   PTA, pt independent with ADL and mobility. Pt currently requires min guard A with mobility @ RW level  and mod A with LB ADL, Pt progressing well. Began education on posterior hip precautions and ADL. Also discussed bathroom set up and DME needed for shower transfer. Will see tomorrow am to complete education regarding ADL and functional mobility for ADL prior to DC home.     Follow Up Recommendations  No OT follow up;Supervision/Assistance - 24 hour (initially)    Equipment Recommendations  3 in 1 bedside commode;Other (comment) (RW)    Recommendations for Other Services       Precautions / Restrictions Precautions Precautions: Posterior Hip;Fall Precaution Booklet Issued: Yes (comment) Restrictions Weight Bearing Restrictions: Yes LLE Weight Bearing: Weight bearing as tolerated      Mobility Bed Mobility               General bed mobility comments: pt OOB in chair  Transfers Overall transfer level: Needs assistance Equipment used: Rolling walker (2 wheeled) Transfers: Sit to/from Stand Sit to Stand: Supervision  Pt declined ambulation as she just walked with PT            Balance Overall balance assessment: History of Falls            Fall due to missing a step/ not loss of balance                              ADL Overall ADL's : Needs assistance/impaired     Grooming: Set up   Upper Body Bathing: Set up   Lower Body Bathing: Moderate assistance;Sit to/from stand   Upper Body Dressing : Set up   Lower Body Dressing: Moderate assistance;Sit to/from stand   Toilet Transfer: Supervision/safety;Stand-pivot   Toileting- Water quality scientist and Hygiene: Minimal assistance;Sit to/from  stand       Functional mobility during ADLs: Min guard;Rolling walker;Cueing for safety General ADL Comments: Began education on compensatory techniques and use of AE for LB ADL. Educated on hip precautions and use of AE. Daughter stated she was familiar with AE but would appreciate further education on precautions. discussed bathroom set up and use of DME. Recommended family collapse 3 in1  and use as shower chair due to greater stability. Family verbalized understanding. will further educate on shower transfer.                     Pertinent Vitals/Pain Pain Assessment: Faces Faces Pain Scale: Hurts a little bit Pain Location: L hip Pain Descriptors / Indicators: Discomfort;Grimacing Pain Intervention(s): Limited activity within patient's tolerance;Repositioned;Ice applied     Hand Dominance Right   Extremity/Trunk Assessment Upper Extremity Assessment Upper Extremity Assessment: Overall WFL for tasks assessed   Lower Extremity Assessment Lower Extremity Assessment: Defer to PT evaluation   Cervical / Trunk Assessment Cervical / Trunk Assessment: Normal   Communication Communication Communication: No difficulties   Cognition Arousal/Alertness: Awake/alert Behavior During Therapy: WFL for tasks assessed/performed Overall Cognitive Status: Within Functional Limits for tasks assessed                 General Comments: difficulty remembering hip precuations. most likely due to meds and not sleeping last  night   General Comments       Exercises  encouraged "foot pumps"     Shoulder Instructions      Home Living Family/patient expects to be discharged to:: Private residence   Available Help at Discharge: Family;Available 24 hours/day Type of Home: House       Home Layout: Two level;Bed/bath upstairs Alternate Level Stairs-Number of Steps: flight   Bathroom Shower/Tub: Occupational psychologist: Standard Bathroom Accessibility: Yes How  Accessible: Accessible via walker Home Equipment: None          Prior Functioning/Environment Level of Independence: Independent        Comments: fell carrying a crock pot downstairs        OT Problem List: Decreased strength;Decreased range of motion;Decreased activity tolerance;Impaired balance (sitting and/or standing);Decreased safety awareness;Decreased knowledge of use of DME or AE;Decreased knowledge of precautions;Pain   OT Treatment/Interventions: Self-care/ADL training;DME and/or AE instruction;Therapeutic activities;Patient/family education;Balance training    OT Goals(Current goals can be found in the care plan section) Acute Rehab OT Goals Patient Stated Goal: to go home OT Goal Formulation: With patient Time For Goal Achievement: 11/26/16 Potential to Achieve Goals: Good ADL Goals Pt Will Perform Lower Body Bathing: with min guard assist;with caregiver independent in assisting;sit to/from stand;with adaptive equipment Pt Will Perform Lower Body Dressing: with min guard assist;with caregiver independent in assisting;sit to/from stand;with adaptive equipment Pt Will Transfer to Toilet: with supervision;ambulating;bedside commode (with caregiver independent in assisting) Pt Will Perform Toileting - Clothing Manipulation and hygiene: with caregiver independent in assisting;with supervision Pt Will Perform Tub/Shower Transfer: with min guard assist;with caregiver independent in assisting;ambulating;rolling walker;3 in 1 Additional ADL Goal #1: Pt/family will independently verbalize 3/3 posterior hip precautions for ADL  OT Frequency: Min 3X/week   Barriers to D/C:            Co-evaluation              End of Session Equipment Utilized During Treatment: Rolling walker Nurse Communication: Mobility status;Precautions;Weight bearing status  Activity Tolerance: Patient tolerated treatment well Patient left: in chair;with call bell/phone within reach;with  family/visitor present   Time: [id number] OT Time Calculation (min): 24 min Charges:  OT General Charges $OT Visit: 1 Procedure OT Evaluation $OT Eval Moderate Complexity: 1 Procedure OT Treatments $Self Care/Home Management : 8-22 mins G-Codes:    Sky Primo,HILLARY 11-20-2016, 2:39 PM   [facility], OT/L  [id number] 11/20/16

## 2016-11-19 NOTE — Progress Notes (Signed)
   Subjective:  Patient reports pain as mild to moderate.  No c/o. Wants to go home.  Objective:   VITALS:   Vitals:   11/18/16 2315 11/18/16 2329 11/18/16 2346 11/19/16 0420  BP: 137/64 138/72 137/62 (!) 116/55  Pulse: 81 71 87 91  Resp: 17 16 16 16   Temp: 97.7 F (36.5 C)  99 F (37.2 C) 98.2 F (36.8 C)  TempSrc:   Oral Oral  SpO2: 100% 100% 98% 93%  Weight:      Height:        NAD ABD soft Sensation intact distally Intact pulses distally Dorsiflexion/Plantar flexion intact Incision: dressing C/D/I Compartment soft    Lab Results  Component Value Date   WBC 7.8 11/19/2016   HGB 11.8 (L) 11/19/2016   HCT 34.9 (L) 11/19/2016   MCV 88.4 11/19/2016   PLT 193 11/19/2016   BMET    Component Value Date/Time   NA 136 11/19/2016 0451   K 3.5 11/19/2016 0451   CL 102 11/19/2016 0451   CO2 29 11/19/2016 0451   GLUCOSE 173 (H) 11/19/2016 0451   BUN 13 11/19/2016 0451   CREATININE 0.63 11/19/2016 0451   CALCIUM 8.6 (L) 11/19/2016 0451   GFRNONAA >60 11/19/2016 0451   GFRAA >60 11/19/2016 0451     Assessment/Plan: 1 Day Post-Op   Active Problems:   Closed fracture of left hip (HCC)   WBAT with walker Posterior hip precautions DVT ppx: lovenox in house --> home on ASA 325 mg PO BID, SCDs, TEDs PT/OT PO pain control D/C home with HHPT after she clears therapy   Elie Goody 11/19/2016, 8:30 AM   Rod Can, MD Cell [phone]

## 2016-11-20 ENCOUNTER — Encounter (HOSPITAL_COMMUNITY): Payer: Self-pay

## 2016-11-20 DIAGNOSIS — D62 Acute posthemorrhagic anemia: Secondary | ICD-10-CM

## 2016-11-20 DIAGNOSIS — E876 Hypokalemia: Secondary | ICD-10-CM

## 2016-11-20 LAB — BASIC METABOLIC PANEL
Anion gap: 7 (ref 5–15)
BUN: 12 mg/dL (ref 6–20)
CALCIUM: 8.4 mg/dL — AB (ref 8.9–10.3)
CHLORIDE: 101 mmol/L (ref 101–111)
CO2: 27 mmol/L (ref 22–32)
Creatinine, Ser: 0.59 mg/dL (ref 0.44–1.00)
GFR calc non Af Amer: >60 mL/min (ref >60)
Glucose, Bld: 125 mg/dL — ABNORMAL HIGH (ref 65–99)
Potassium: 3.1 mmol/L — ABNORMAL LOW (ref 3.5–5.1)
Sodium: 135 mmol/L (ref 135–145)

## 2016-11-20 LAB — RETICULOCYTES
RBC.: 3.57 MIL/uL — ABNORMAL LOW (ref 3.87–5.11)
RETIC COUNT ABSOLUTE: 64.3 10*3/uL (ref 19.0–186.0)
RETIC CT PCT: 1.8 % (ref 0.4–3.1)

## 2016-11-20 LAB — CBC
HEMATOCRIT: 29.5 % — AB (ref 36.0–46.0)
Hemoglobin: 10.2 g/dL — ABNORMAL LOW (ref 12.0–15.0)
MCH: 30.1 pg (ref 26.0–34.0)
MCHC: 34.6 g/dL (ref 30.0–36.0)
MCV: 87 fL (ref 78.0–100.0)
Platelets: 159 10*3/uL (ref 150–400)
RBC: 3.39 MIL/uL — AB (ref 3.87–5.11)
RDW: 12.7 % (ref 11.5–15.5)
WBC: 7.2 10*3/uL (ref 4.0–10.5)

## 2016-11-20 LAB — IRON AND TIBC
IRON: 14 ug/dL — AB (ref 28–170)
Saturation Ratios: 5 % — ABNORMAL LOW (ref 10.4–31.8)
TIBC: 284 ug/dL (ref 250–450)
UIBC: 270 ug/dL

## 2016-11-20 LAB — VITAMIN B12: Vitamin B-12: 215 pg/mL (ref 180–914)

## 2016-11-20 LAB — FERRITIN: FERRITIN: 65 ng/mL (ref 11–307)

## 2016-11-20 LAB — FOLATE: Folate: 14 ng/mL (ref >5.9)

## 2016-11-20 MED ORDER — ASPIRIN EC 325 MG PO TBEC: 325.0000 mg | DELAYED_RELEASE_TABLET | Freq: Two times a day (BID) | ORAL | 0 refills | Status: DC

## 2016-11-20 MED ORDER — NICOTINE 21 MG/24HR TD PT24: 21.0000 mg | MEDICATED_PATCH | TRANSDERMAL | 0 refills | Status: DC

## 2016-11-20 MED ORDER — POTASSIUM CHLORIDE CRYS ER 20 MEQ PO TBCR
40.0000 meq | EXTENDED_RELEASE_TABLET | Freq: Once | ORAL | Status: AC
  Administered 2016-11-20: 40 meq via ORAL
  Filled 2016-11-20: qty 2

## 2016-11-20 MED ORDER — HYDROCODONE-ACETAMINOPHEN 5-325 MG PO TABS: 1.0000 | ORAL_TABLET | Freq: Four times a day (QID) | ORAL | 0 refills | Status: DC | PRN

## 2016-11-20 MED ORDER — METHOCARBAMOL 500 MG PO TABS: 500.0000 mg | ORAL_TABLET | Freq: Four times a day (QID) | ORAL | 0 refills | Status: DC | PRN

## 2016-11-20 MED ORDER — DOCUSATE SODIUM 100 MG PO CAPS: 100.0000 mg | ORAL_CAPSULE | Freq: Two times a day (BID) | ORAL | 0 refills | Status: DC

## 2016-11-20 NOTE — Progress Notes (Signed)
Occupational Therapy Treatment Patient Details Name: [name] MRN: [id number] DOB: 07-13-1946 Today's Date: 11/20/2016    History of present illness s/p fall with L hip fx. underwent L posterior hemiarthroplasty.    OT comments  Pt progressing toward OT goals. Required increased encouragement to participate this session but motivated after verbal support provided. Pt able to complete shower transfer with mod assist and toilet transfer with min guard assist this session. Pt has purchased AE for LB ADL but preferred daughter complete dressing tasks for her this session. Daughter able to provide necessary assistance with dressing. Pt and family very determined to return home this afternoon despite assist levels needed and pt will have family support at home. OT will continue to follow acutely to address pt/family education and safety with ADL. D/C plan remains appropriate.  Follow Up Recommendations  No OT follow up;Supervision/Assistance - 24 hour    Equipment Recommendations  3 in 1 bedside commode (RW)    Recommendations for Other Services      Precautions / Restrictions Precautions Precautions: Posterior Hip;Fall Precaution Booklet Issued: Yes (comment) Precaution Comments: verbally reviewed precautions Restrictions Weight Bearing Restrictions: Yes LLE Weight Bearing: Weight bearing as tolerated       Mobility Bed Mobility Overal bed mobility: Needs Assistance Bed Mobility: Supine to Sit     Supine to sit: Mod assist     General bed mobility comments: Received in recliner  Transfers Overall transfer level: Needs assistance Equipment used: Rolling walker (2 wheeled) Transfers: Sit to/from Stand Sit to Stand: Min assist         General transfer comment: Min A for safety from recliner    Balance Overall balance assessment: Needs assistance Sitting-balance support: No upper extremity supported;Feet supported Sitting balance-Leahy Scale: Fair Sitting balance -  Comments: sitting EOB   Standing balance support: Bilateral upper extremity supported;Single extremity supported Standing balance-Leahy Scale: Poor Standing balance comment: reliant on RW                   ADL Overall ADL's : Needs assistance/impaired                 Upper Body Dressing : Maximal assistance;With caregiver independent assisting;Sitting Upper Body Dressing Details (indicate cue type and reason): Pt able to complete task after set-up but daughter prefers to complete task for patient. Lower Body Dressing: Maximal assistance;Sit to/from stand;With caregiver independent assisting   Toilet Transfer: Min guard;Ambulation;BSC;RW   Toileting- Clothing Manipulation and Hygiene: Minimal assistance;Sit to/from stand   Tub/ Banker: Moderate assistance;Ambulation;3 in 1;Rolling walker;Walk-in shower   Functional mobility during ADLs: Min guard;Rolling walker;Cueing for safety General ADL Comments: Educated on hip precautions with LB ADL. Pt's daughter had purchased hip kit but pt and daughter report preference to have daughter dress pt. Educated on shower transfer with 3-in-1. Pt and family demonstrate understanding.       Vision                     Perception     Praxis      Cognition   Behavior During Therapy: [facility] for tasks assessed/performed Overall Cognitive Status: Within Functional Limits for tasks assessed Area of Impairment: Following commands;Attention   Current Attention Level: Selective    Following Commands: Follows one step commands with increased time       General Comments: Improved demeanor and participation in therapy this session    Extremity/Trunk Assessment  Exercises Total Joint Exercises Ankle Circles/Pumps: AROM;Both;20 reps;Supine Quad Sets: AROM;Left;10 reps;Supine Heel Slides: AAROM;Left;10 reps;Supine Other Exercises Other Exercises: Chair push-ups. Educated pt on chair push-ups and pt  able to demonstrate x1.   Shoulder Instructions       General Comments      Pertinent Vitals/ Pain       Pain Assessment: 0-10 Pain Score: 5  Faces Pain Scale: Hurts even more Pain Location: L hip Pain Descriptors / Indicators: Discomfort;Grimacing Pain Intervention(s): Monitored during session;Limited activity within patient's tolerance;Ice applied  Home Living                                          Prior Functioning/Environment              Frequency  Min 3X/week        Progress Toward Goals  OT Goals(current goals can now be found in the care plan section)  Progress towards OT goals: Progressing toward goals  Acute Rehab OT Goals Patient Stated Goal: to go home OT Goal Formulation: With patient Time For Goal Achievement: 11/26/16 Potential to Achieve Goals: Good ADL Goals Pt Will Perform Lower Body Bathing: with min guard assist;with caregiver independent in assisting;sit to/from stand;with adaptive equipment Pt Will Perform Lower Body Dressing: with min guard assist;with caregiver independent in assisting;sit to/from stand;with adaptive equipment Pt Will Transfer to Toilet: with supervision;ambulating;bedside commode (with caregiver independent in assisting) Pt Will Perform Toileting - Clothing Manipulation and hygiene: with caregiver independent in assisting;with supervision Pt Will Perform Tub/Shower Transfer: with min guard assist;with caregiver independent in assisting;ambulating;rolling walker;3 in 1 Additional ADL Goal #1: Pt/family will independently verbalize 3/3 posterior hip precautions for ADL  Plan Discharge plan remains appropriate    Co-evaluation                 End of Session Equipment Utilized During Treatment: Rolling walker;Gait belt   Activity Tolerance Patient tolerated treatment well   Patient Left in chair;with call bell/phone within reach;with family/visitor present   Nurse Communication           Time: 4782-9562 OT Time Calculation (min): 40 min  Charges: OT General Charges $OT Visit: 1 Procedure OT Treatments $Self Care/Home Management : 38-52 mins  Norman Herrlich, OTR/L [phone] 11/20/2016, 1:54 PM

## 2016-11-20 NOTE — Care Management Note (Signed)
Case Management Note  Patient Details  Name: [name] MRN: [id number] Date of Birth: Aug 09, 1946  Subjective/Objective:                    Action/Plan:   Expected Discharge Date:                  Expected Discharge Plan:  [place]  In-House Referral:     Discharge planning Services  CM Consult  Post Acute Care Choice:  Durable Medical Equipment, Home Health Choice offered to:  Patient, Adult Children  DME Arranged:  3-N-1, Walker rolling DME Agency:  [place]:  PT [place]:  [facility] (now [facility])  Status of Service:  Completed, signed off  If discussed at [employer] of Stay Meetings, dates discussed:    Additional Comments:  Marilu Favre, RN 11/20/2016, 1:01 PM

## 2016-11-20 NOTE — Discharge Summary (Signed)
Physician Discharge Summary  [name] [id number] DOB: [birth year] DOA: 11/18/2016  PCP: Abigail Miyamoto, MD  Admit date: 11/18/2016 Discharge date: 11/20/2016  Time spent: 45 minutes  Recommendations for Outpatient Follow-up:  Patient will be discharged to home with home health, PT.  Patient will need to follow up with primary care provider within one week of discharge, repeat CBC and BMP in one week.  Follow up with Dr. Stann Mainland, orthopedics in 2weeks.  Patient should continue medications as prescribed.  Patient should follow a regular diet.   Discharge Diagnoses:  Left proximal femur fracture Tobacco abuse Anemia secondary to blood loss Hypokalemia  Discharge Condition: stable  Diet recommendation: regular  Filed Weights   11/18/16 1528  Weight: 65.3 kg (144 lb)    History of present illness:  on 11/18/2016 [name] a 70 y.o.femalewith a medical history of endometrial cancer, presented to the emergency department after withstanding a fall. Patient states she was trying to walk backwards when she thought she only had one step but missed a step and fell onto her left side. She denies hitting her head. Denies any dizziness prior to the fall. Patient currently denies any chest pain, shortness of breath, abdominal pain, nausea or vomiting, diarrhea constipation, dizziness or headache. Denies any recent illness or sick contacts. Patient does complain of pain in her low left hip  Hospital Course:  Left proximal femur fracture -Secondary to mechanical fall -X-ray shows fracture of the proximal left femur -Orthopedics consulted and appreciated, Dr. Stann Mainland -S/p Open treatment of femoral fracture, proximal end, neck, prosthetic replacement  -Continue pain control -PT and OT consulted and appreciated- PT recommended home health -Ortho follow up in 2 weeks, Aspirin 325mg  BID  Tobacco abuse -Smoking cessation discussed -Nicotine patch  Anemia secondary to blood  loss -Possibly secondary to recent surgery -hemoglobin 10.2 -Continue to monitor CBC  Hypokalemia -Replaced, repeat BMP in one week  Consultants Orthopedics  Procedures  Open treatment of femoral fracture, proximal end, neck, prosthetic replacement   Discharge Exam: Vitals:   11/19/16 2045 11/20/16 0505  BP: (!) 112/44 (!) 121/48  Pulse: 79 82  Resp: 16 16  Temp: 98.3 F (36.8 C) 97.9 F (36.6 C)   Complains of some pain and spasms.  Wants to go home.  Denies chest pain, shortness of breath, abdominal pain, N/V/D/C.     Exam  General: Well developed, well nourished, NAD, appears stated age  16: NCAT, mucous membranes moist.   Cardiovascular: S1 S2 auscultated,no murmurs, RRR  Respiratory: Clear to auscultation bilaterally with equal chest rise  Abdomen: Soft, nontender, nondistended, + bowel sounds  Extremities: warm dry without cyanosis clubbing. Chronic edema RLE (non pitting). Left hip dressing in place.  Neuro: AAOx3, nonfocal  Psych: Normal affect and demeanor with intact judgement and insight, pleasant  Discharge Instructions Discharge Instructions    Discharge instructions    Complete by:  As directed    Patient will be discharged to home with home health, PT.  Patient will need to follow up with primary care provider within one week of discharge, repeat CBC and BMP in one week.  Follow up with Dr. Stann Mainland, orthopedics in 2weeks.  Patient should continue medications as prescribed.  Patient should follow a regular diet.     Current Discharge Medication List    START taking these medications   Details  aspirin EC 325 MG tablet Take 1 tablet (325 mg total) by mouth 2 (two) times daily. Qty: 60 tablet, Refills: 0  docusate sodium (COLACE) 100 MG capsule Take 1 capsule (100 mg total) by mouth 2 (two) times daily. Qty: 10 capsule, Refills: 0    HYDROcodone-acetaminophen (NORCO/VICODIN) 5-325 MG tablet Take 1-2 tablets by mouth every 6 (six) hours as  needed for moderate pain. Qty: 30 tablet, Refills: 0    methocarbamol (ROBAXIN) 500 MG tablet Take 1 tablet (500 mg total) by mouth every 6 (six) hours as needed for muscle spasms. Qty: 30 tablet, Refills: 0    nicotine (NICODERM CQ - DOSED IN MG/24 HOURS) 21 mg/24hr patch Place 1 patch (21 mg total) onto the skin daily. Qty: 28 patch, Refills: 0      CONTINUE these medications which have NOT CHANGED   Details  bimatoprost (LUMIGAN) 0.01 % SOLN Place 1 drop into both eyes at bedtime.    fluticasone (FLONASE) 50 MCG/ACT nasal spray Place 2 sprays into both nostrils daily. Refills: 3    Multiple Vitamin (MULTIVITAMIN WITH MINERALS) TABS tablet Take 1 tablet by mouth daily.    SIMBRINZA 1-0.2 % SUSP Place 1 drop into both eyes 2 (two) times daily. Refills: 3    brimonidine-timolol (COMBIGAN) 0.2-0.5 % ophthalmic solution Place 1 drop into both eyes every 12 (twelve) hours.    estradiol (ESTRACE) 0.5 MG tablet Take 0.5 mg by mouth daily. Refills: 11      STOP taking these medications     ibuprofen (ADVIL,MOTRIN) 200 MG tablet      oxyCODONE (OXY IR/ROXICODONE) 5 MG immediate release tablet        Allergies  Allergen Reactions  . Penicillins     Has patient had a PCN reaction causing immediate rash, facial/tongue/throat swelling, SOB or lightheadedness with hypotension: YES Has patient had a PCN reaction causing severe rash involving mucus membranes or skin necrosis:NO Has patient had a PCN reaction that required hospitalization NO Has patient had a PCN reaction occurring within the last 10 years: NO If all of the above answers are "NO", then may proceed with Cephalosporin use.   Follow-up Information    Nicholes Stairs, MD. Schedule an appointment as soon as possible for a visit in 2 week(s).   Specialty:  Orthopedic Surgery Why:  For suture removal, For wound re-check Contact information: [address] [place] [address] [phone]         Abigail Miyamoto, MD. Schedule an appointment as soon as possible for a visit in 1 week(s).   Specialty:  Family Medicine Why:  Hospital follow up, repeat labs Contact information: [address] [place] [address] [phone]            The results of significant diagnostics from this hospitalization (including imaging, microbiology, ancillary and laboratory) are listed below for reference.    Significant Diagnostic Studies: Dg Chest 1 View  Result Date: 11/18/2016 CLINICAL DATA:  Pt c/o pain in left hip with limited mobility due to fall down the steps carrying a crock pot of food. Pt reports landing on her left side. EXAM: CHEST 1 VIEW COMPARISON:  None. FINDINGS: Midline trachea. M mild cardiomegaly. Mediastinal contours otherwise within normal limits. No pleural effusion or pneumothorax. Hyperinflation. Clear lungs. IMPRESSION: Hyperinflation and mild cardiomegaly.  No acute findings. Electronically Signed   By: Abigail Miyamoto M.D.   On: 11/18/2016 16:34   Dg Pelvis Portable  Result Date: 11/18/2016 CLINICAL DATA:  Hip fracture, status post hip replacement EXAM: PORTABLE PELVIS 1-2 VIEWS COMPARISON:  Left femur radiograph 11/18/2016 FINDINGS: Status  post left hip hemiarthroplasty. The femoral stem is well seated. No hardware adverse features. No periprosthetic fracture. There is expected soft tissue gas in the adjacent 5. IMPRESSION: Status post left hip hemiarthroplasty without adverse features. Electronically Signed   By: Ulyses Jarred M.D.   On: 11/18/2016 23:38   Dg Hip Unilat W Or Wo Pelvis 2-3 Views Left  Result Date: 11/18/2016 CLINICAL DATA:  Left hip pain and fell down steps. EXAM: DG HIP (WITH OR WITHOUT PELVIS) 2-3V LEFT COMPARISON:  None. FINDINGS: There is a mildly displaced and impacted fracture involving the proximal left femur. Fracture is near the junction of the left femoral head and neck. This probably represents a subcapital fracture but there may be a  transcervical component. The left femoral head is located. Right hip is grossly intact. Pelvic bony ring is intact. IMPRESSION: Fracture of the proximal left femur. Electronically Signed   By: Markus Daft M.D.   On: 11/18/2016 16:36   Dg Femur Min 2 Views Left  Result Date: 11/18/2016 CLINICAL DATA:  Known left femoral neck fracture EXAM: LEFT FEMUR 2 VIEWS COMPARISON:  Films from earlier in the same day FINDINGS: There is again noted a femoral neck fracture with some impaction and angulation at the fracture site. There is suggestion of superior pubic ramus fracture as well. The more distal femur is within normal limits. No soft tissue abnormality is noted. IMPRESSION: Left femoral neck fracture stable from the prior study. Changes suggestive of superior pubic ramus fracture on the left. Electronically Signed   By: Inez Catalina M.D.   On: 11/18/2016 18:16    Microbiology: Recent Results (from the past 240 hour(s))  Surgical pcr screen     Status: None   Collection Time: 11/18/16  6:41 PM  Result Value Ref Range Status   MRSA, PCR NEGATIVE NEGATIVE Final   Staphylococcus aureus NEGATIVE NEGATIVE Final    Comment:        The Xpert SA Assay (FDA approved for NASAL specimens in patients over 54 years of age), is one component of a comprehensive surveillance program.  Test performance has been validated by [facility] for patients greater than or equal to 30 year old. It is not intended to diagnose infection nor to guide or monitor treatment.      Labs: Basic Metabolic Panel:  Recent Labs Lab 11/18/16 1652 11/19/16 0451 11/20/16 0345  NA 138 136 135  K 3.6 3.5 3.1*  CL 106 102 101  CO2 25 29 27   GLUCOSE 116* 173* 125*  BUN 19 13 12   CREATININE 0.62 0.63 0.59  CALCIUM 8.6* 8.6* 8.4*   Liver Function Tests:  Recent Labs Lab 11/18/16 1652  AST 17  ALT 16  ALKPHOS 89  BILITOT 0.4  PROT 6.0*  ALBUMIN 3.5   No results for input(s): LIPASE, AMYLASE in the last 168  hours. No results for input(s): AMMONIA in the last 168 hours. CBC:  Recent Labs Lab 11/18/16 1652 11/19/16 0451 11/20/16 0345  WBC 10.5 7.8 7.2  NEUTROABS 9.0*  --   --   HGB 13.0 11.8* 10.2*  HCT 37.3 34.9* 29.5*  MCV 87.8 88.4 87.0  PLT 220 193 159   Cardiac Enzymes: No results for input(s): CKTOTAL, CKMB, CKMBINDEX, TROPONINI in the last 168 hours. BNP: BNP (last 3 results) No results for input(s): BNP in the last 8760 hours.  ProBNP (last 3 results) No results for input(s): PROBNP in the last 8760 hours.  CBG: No results for  input(s): GLUCAP in the last 168 hours.     SignedANEISA, BLANCHE  [facility] 11/20/2016, 11:14 AM

## 2016-11-20 NOTE — Progress Notes (Signed)
OT Cancellation Note  Patient Details Name: [name] MRN: [id number] DOB: 04-09-1946   Cancelled Treatment:    Reason Eval/Treat Not Completed: Patient declined, no reason specified;Other (comment): Attempted to see. Pt requesting therapist return at a later time as she is still lethargic following sleeping this am. OT will check back as able.  [address] OTR/L [id number] 11/20/2016, 11:34 AM

## 2016-11-20 NOTE — Progress Notes (Signed)
Physical Therapy Treatment Patient Details Name: [name] MRN: [id number] DOB: Nov 14, 1946 Today's Date: 11/20/2016    History of Present Illness s/p fall with L hip fx. underwent L posterior hemiarthroplasty.     PT Comments    Pt presents with improved mobility this session and improved alertness. Performed stair negotiation with cues for proper sequencing. Pt is afraid of weight bearing through LLE to bring right onto the stair and requires Mod A to safely step up 2 steps. Min A to CGA was required to descend steps. Pt is, however, determined she is going home today and has a lot of family support who will be available when she returns home.    Follow Up Recommendations  Home health PT;Supervision - Intermittent     Equipment Recommendations  Rolling walker with 5" wheels;3in1 (PT)    Recommendations for Other Services       Precautions / Restrictions Precautions Precautions: Posterior Hip;Fall Precaution Booklet Issued: Yes (comment) Precaution Comments: verbally reviewed precautions Restrictions Weight Bearing Restrictions: Yes LLE Weight Bearing: Weight bearing as tolerated    Mobility  Bed Mobility Overal bed mobility: Needs Assistance Bed Mobility: Supine to Sit     Supine to sit: Mod assist     General bed mobility comments: (P) Received in recliner  Transfers Overall transfer level: Needs assistance Equipment used: Rolling walker (2 wheeled) Transfers: Sit to/from Stand Sit to Stand: Min assist         General transfer comment: Min A for safety from recliner  Ambulation/Gait Ambulation/Gait assistance: Min assist Ambulation Distance (Feet): 10 Feet (5x2) Assistive device: Rolling walker (2 wheeled) Gait Pattern/deviations: Step-to pattern;Decreased step length - right;Decreased stride length;Decreased stance time - left;Decreased weight shift to left Gait velocity: decreased Gait velocity interpretation: Below normal speed for  age/gender General Gait Details: continunes to have increased knee flexion with stance, but is able to improve with cueing   Stairs Stairs: Yes   Stair Management: One rail Left;Step to pattern;Forwards Number of Stairs: 2 General stair comments: Mod A to assist with bringing RLE up onto stair. pt is able to descend with decreased assistance.   Wheelchair Mobility    Modified Rankin (Stroke Patients Only)       Balance Overall balance assessment: (P) Needs assistance Sitting-balance support: No upper extremity supported;Feet supported Sitting balance-Leahy Scale: Fair Sitting balance - Comments: sitting EOB   Standing balance support: Bilateral upper extremity supported Standing balance-Leahy Scale: Poor Standing balance comment: reliant on RW                    Cognition Arousal/Alertness: Awake/alert Behavior During Therapy: WFL for tasks assessed/performed Overall Cognitive Status: Within Functional Limits for tasks assessed Area of Impairment: Following commands;Attention   Current Attention Level: Selective   Following Commands: Follows one step commands with increased time       General Comments: Improved demeanor and participation in therapy this session    Exercises Total Joint Exercises Ankle Circles/Pumps: AROM;Both;20 reps;Supine Quad Sets: AROM;Left;10 reps;Supine Heel Slides: AAROM;Left;10 reps;Supine    General Comments        Pertinent Vitals/Pain Pain Assessment: 0-10 Pain Score: 5  Faces Pain Scale: Hurts even more Pain Location: L hip Pain Descriptors / Indicators: Discomfort;Grimacing Pain Intervention(s): Monitored during session;Limited activity within patient's tolerance;Ice applied    Home Living                      Prior Function  PT Goals (current goals can now be found in the care plan section) Acute Rehab PT Goals Patient Stated Goal: to go home Progress towards PT goals: Progressing toward  goals    Frequency    Min 5X/week      PT Plan Current plan remains appropriate    Co-evaluation             End of Session Equipment Utilized During Treatment: Gait belt Activity Tolerance: Patient tolerated treatment well Patient left: in chair;with call bell/phone within reach;with family/visitor present     Time: [id number] PT Time Calculation (min) (ACUTE ONLY): 24 min  Charges:  $Gait Training: 23-37 mins $Therapeutic Exercise: 8-22 mins $Therapeutic Activity: 8-22 mins                    G Codes:      Scheryl Marten PT, DPT  [phone]  11/20/2016, 1:51 PM

## 2016-11-20 NOTE — Progress Notes (Signed)
Physical Therapy Treatment Patient Details Name: [name] MRN: [id number] DOB: Oct 03, 1946 Today's Date: 11/20/2016    History of Present Illness s/p fall with L hip fx. underwent L posterior hemiarthroplasty.     PT Comments    Pt presents with increased lethargy and decreased ability to perform gait and transfers this session. Pt requires increased assistance for all mobility and requires increased cues for proper sequencing. Pt was unable to attempt stair negotiation and will need to perform stairs before returning home. Rn notified of pt's status and need for an additional visit before discharge.   Follow Up Recommendations  Home health PT;Supervision - Intermittent     Equipment Recommendations  Rolling walker with 5" wheels;3in1 (PT)    Recommendations for Other Services       Precautions / Restrictions Precautions Precautions: Posterior Hip;Fall Precaution Booklet Issued: Yes (comment) Precaution Comments: verbally reviewed precautions Restrictions Weight Bearing Restrictions: Yes LLE Weight Bearing: Weight bearing as tolerated    Mobility  Bed Mobility Overal bed mobility: Needs Assistance Bed Mobility: Supine to Sit     Supine to sit: Mod assist     General bed mobility comments: Mod a to EOB with cues for sequencing  Transfers Overall transfer level: Needs assistance Equipment used: Rolling walker (2 wheeled) Transfers: Sit to/from Stand Sit to Stand: Mod assist         General transfer comment: Mod A from EOB to RW and from commode to RW with cues for hand placement and sequencing  Ambulation/Gait Ambulation/Gait assistance: Mod assist Ambulation Distance (Feet): 20 Feet (10x2) Assistive device: Rolling walker (2 wheeled) Gait Pattern/deviations: Step-to pattern;Decreased step length - right;Decreased stride length;Decreased stance time - left;Decreased weight shift to left Gait velocity: decreased Gait velocity interpretation: Below normal  speed for age/gender General Gait Details: increased posterior trunk lean, poor weightshift over BOS with increased knee flexion. Requies Max cues to maintain LE's extended ins standing. pt becomes fatigued coming back from bathroom and has to sit down with Mod-Max A   Stairs            Wheelchair Mobility    Modified Rankin (Stroke Patients Only)       Balance Overall balance assessment: Needs assistance Sitting-balance support: No upper extremity supported;Feet supported Sitting balance-Leahy Scale: Fair Sitting balance - Comments: sitting EOB   Standing balance support: Bilateral upper extremity supported Standing balance-Leahy Scale: Poor Standing balance comment: reliant on RW                    Cognition Arousal/Alertness: Lethargic Behavior During Therapy: Flat affect Overall Cognitive Status: Impaired/Different from baseline Area of Impairment: Following commands;Attention   Current Attention Level: Selective   Following Commands: Follows one step commands with increased time       General Comments: diffiuclty remembering precautions, noticeable sleepy and difficulty focusing on tasks when on commode    Exercises Total Joint Exercises Ankle Circles/Pumps: AROM;Both;20 reps;Supine Quad Sets: AROM;Left;10 reps;Supine Heel Slides: AAROM;Left;10 reps;Supine    General Comments        Pertinent Vitals/Pain Pain Assessment: 0-10 Pain Score: 5  Pain Location: L hip Pain Descriptors / Indicators: Discomfort;Grimacing Pain Intervention(s): Monitored during session;Premedicated before session    Home Living                      Prior Function            PT Goals (current goals can now be found in the care plan  section) Acute Rehab PT Goals Patient Stated Goal: to go home Progress towards PT goals: Not progressing toward goals - comment (decreased tolerance due to lethargy)    Frequency    Min 5X/week      PT Plan Current  plan remains appropriate;Other (comment) (possibly need to change DC plan if pt doesn't improve)    Co-evaluation             End of Session Equipment Utilized During Treatment: Gait belt Activity Tolerance: Patient limited by lethargy Patient left: in chair;with call bell/phone within reach;with family/visitor present     Time: 0920-1029 PT Time Calculation (min) (ACUTE ONLY): 69 min  Charges:  $Gait Training: 23-37 mins $Therapeutic Exercise: 8-22 mins $Therapeutic Activity: 8-22 mins                    G Codes:      Scheryl Marten PT, DPT  [phone]  11/20/2016, 10:41 AM

## 2016-11-21 ENCOUNTER — Encounter (HOSPITAL_COMMUNITY): Payer: Self-pay | Admitting: Orthopedic Surgery

## 2016-11-21 DIAGNOSIS — Z8542 Personal history of malignant neoplasm of other parts of uterus: Secondary | ICD-10-CM | POA: Diagnosis not present

## 2016-11-21 DIAGNOSIS — W19XXXD Unspecified fall, subsequent encounter: Secondary | ICD-10-CM | POA: Diagnosis not present

## 2016-11-21 DIAGNOSIS — S72092D Other fracture of head and neck of left femur, subsequent encounter for closed fracture with routine healing: Secondary | ICD-10-CM | POA: Diagnosis not present

## 2016-11-21 DIAGNOSIS — S32512D Fracture of superior rim of left pubis, subsequent encounter for fracture with routine healing: Secondary | ICD-10-CM | POA: Diagnosis not present

## 2016-11-21 DIAGNOSIS — Z96642 Presence of left artificial hip joint: Secondary | ICD-10-CM | POA: Diagnosis not present

## 2016-11-21 DIAGNOSIS — F17200 Nicotine dependence, unspecified, uncomplicated: Secondary | ICD-10-CM | POA: Diagnosis not present

## 2016-11-23 DIAGNOSIS — Z96642 Presence of left artificial hip joint: Secondary | ICD-10-CM | POA: Diagnosis not present

## 2016-11-23 DIAGNOSIS — Z8542 Personal history of malignant neoplasm of other parts of uterus: Secondary | ICD-10-CM | POA: Diagnosis not present

## 2016-11-23 DIAGNOSIS — F17200 Nicotine dependence, unspecified, uncomplicated: Secondary | ICD-10-CM | POA: Diagnosis not present

## 2016-11-23 DIAGNOSIS — W19XXXD Unspecified fall, subsequent encounter: Secondary | ICD-10-CM | POA: Diagnosis not present

## 2016-11-23 DIAGNOSIS — S72092D Other fracture of head and neck of left femur, subsequent encounter for closed fracture with routine healing: Secondary | ICD-10-CM | POA: Diagnosis not present

## 2016-11-23 DIAGNOSIS — S32512D Fracture of superior rim of left pubis, subsequent encounter for fracture with routine healing: Secondary | ICD-10-CM | POA: Diagnosis not present

## 2016-11-27 DIAGNOSIS — Z8542 Personal history of malignant neoplasm of other parts of uterus: Secondary | ICD-10-CM | POA: Diagnosis not present

## 2016-11-27 DIAGNOSIS — F17200 Nicotine dependence, unspecified, uncomplicated: Secondary | ICD-10-CM | POA: Diagnosis not present

## 2016-11-27 DIAGNOSIS — Z96642 Presence of left artificial hip joint: Secondary | ICD-10-CM | POA: Diagnosis not present

## 2016-11-27 DIAGNOSIS — W19XXXD Unspecified fall, subsequent encounter: Secondary | ICD-10-CM | POA: Diagnosis not present

## 2016-11-27 DIAGNOSIS — S72092D Other fracture of head and neck of left femur, subsequent encounter for closed fracture with routine healing: Secondary | ICD-10-CM | POA: Diagnosis not present

## 2016-11-27 DIAGNOSIS — S32512D Fracture of superior rim of left pubis, subsequent encounter for fracture with routine healing: Secondary | ICD-10-CM | POA: Diagnosis not present

## 2016-11-28 DIAGNOSIS — M8000XA Age-related osteoporosis with current pathological fracture, unspecified site, initial encounter for fracture: Secondary | ICD-10-CM | POA: Diagnosis not present

## 2016-11-28 DIAGNOSIS — D649 Anemia, unspecified: Secondary | ICD-10-CM | POA: Diagnosis not present

## 2016-11-28 DIAGNOSIS — S5002XA Contusion of left elbow, initial encounter: Secondary | ICD-10-CM | POA: Diagnosis not present

## 2016-11-28 DIAGNOSIS — E876 Hypokalemia: Secondary | ICD-10-CM | POA: Diagnosis not present

## 2016-11-28 DIAGNOSIS — Z8781 Personal history of (healed) traumatic fracture: Secondary | ICD-10-CM | POA: Diagnosis not present

## 2016-11-29 DIAGNOSIS — Z96642 Presence of left artificial hip joint: Secondary | ICD-10-CM | POA: Diagnosis not present

## 2016-11-29 DIAGNOSIS — Z471 Aftercare following joint replacement surgery: Secondary | ICD-10-CM | POA: Diagnosis not present

## 2016-11-30 DIAGNOSIS — S32512D Fracture of superior rim of left pubis, subsequent encounter for fracture with routine healing: Secondary | ICD-10-CM | POA: Diagnosis not present

## 2016-11-30 DIAGNOSIS — Z8542 Personal history of malignant neoplasm of other parts of uterus: Secondary | ICD-10-CM | POA: Diagnosis not present

## 2016-11-30 DIAGNOSIS — S72092D Other fracture of head and neck of left femur, subsequent encounter for closed fracture with routine healing: Secondary | ICD-10-CM | POA: Diagnosis not present

## 2016-11-30 DIAGNOSIS — W19XXXD Unspecified fall, subsequent encounter: Secondary | ICD-10-CM | POA: Diagnosis not present

## 2016-11-30 DIAGNOSIS — F17200 Nicotine dependence, unspecified, uncomplicated: Secondary | ICD-10-CM | POA: Diagnosis not present

## 2016-11-30 DIAGNOSIS — Z96642 Presence of left artificial hip joint: Secondary | ICD-10-CM | POA: Diagnosis not present

## 2016-12-05 DIAGNOSIS — S32512D Fracture of superior rim of left pubis, subsequent encounter for fracture with routine healing: Secondary | ICD-10-CM | POA: Diagnosis not present

## 2016-12-05 DIAGNOSIS — S72092D Other fracture of head and neck of left femur, subsequent encounter for closed fracture with routine healing: Secondary | ICD-10-CM | POA: Diagnosis not present

## 2016-12-05 DIAGNOSIS — W19XXXD Unspecified fall, subsequent encounter: Secondary | ICD-10-CM | POA: Diagnosis not present

## 2016-12-05 DIAGNOSIS — Z8542 Personal history of malignant neoplasm of other parts of uterus: Secondary | ICD-10-CM | POA: Diagnosis not present

## 2016-12-05 DIAGNOSIS — Z96642 Presence of left artificial hip joint: Secondary | ICD-10-CM | POA: Diagnosis not present

## 2016-12-05 DIAGNOSIS — F17200 Nicotine dependence, unspecified, uncomplicated: Secondary | ICD-10-CM | POA: Diagnosis not present

## 2016-12-26 DIAGNOSIS — Z96649 Presence of unspecified artificial hip joint: Secondary | ICD-10-CM | POA: Diagnosis not present

## 2016-12-26 DIAGNOSIS — Z96642 Presence of left artificial hip joint: Secondary | ICD-10-CM | POA: Diagnosis not present

## 2016-12-26 DIAGNOSIS — Z471 Aftercare following joint replacement surgery: Secondary | ICD-10-CM | POA: Diagnosis not present

## 2017-01-25 DIAGNOSIS — H401111 Primary open-angle glaucoma, right eye, mild stage: Secondary | ICD-10-CM | POA: Diagnosis not present

## 2017-01-25 DIAGNOSIS — H401122 Primary open-angle glaucoma, left eye, moderate stage: Secondary | ICD-10-CM | POA: Diagnosis not present

## 2017-02-12 DIAGNOSIS — Z471 Aftercare following joint replacement surgery: Secondary | ICD-10-CM | POA: Diagnosis not present

## 2017-02-12 DIAGNOSIS — Z96642 Presence of left artificial hip joint: Secondary | ICD-10-CM | POA: Diagnosis not present

## 2017-03-01 DIAGNOSIS — H401122 Primary open-angle glaucoma, left eye, moderate stage: Secondary | ICD-10-CM | POA: Diagnosis not present

## 2017-04-05 DIAGNOSIS — H401122 Primary open-angle glaucoma, left eye, moderate stage: Secondary | ICD-10-CM | POA: Diagnosis not present

## 2017-04-05 DIAGNOSIS — H401111 Primary open-angle glaucoma, right eye, mild stage: Secondary | ICD-10-CM | POA: Diagnosis not present

## 2017-08-16 DIAGNOSIS — H401123 Primary open-angle glaucoma, left eye, severe stage: Secondary | ICD-10-CM | POA: Diagnosis not present

## 2017-08-21 DIAGNOSIS — H401123 Primary open-angle glaucoma, left eye, severe stage: Secondary | ICD-10-CM | POA: Diagnosis not present

## 2017-09-11 DIAGNOSIS — H04123 Dry eye syndrome of bilateral lacrimal glands: Secondary | ICD-10-CM | POA: Diagnosis not present

## 2017-09-27 DIAGNOSIS — Z23 Encounter for immunization: Secondary | ICD-10-CM | POA: Diagnosis not present

## 2017-10-02 DIAGNOSIS — H401111 Primary open-angle glaucoma, right eye, mild stage: Secondary | ICD-10-CM | POA: Diagnosis not present

## 2017-11-21 DIAGNOSIS — H401123 Primary open-angle glaucoma, left eye, severe stage: Secondary | ICD-10-CM | POA: Diagnosis not present

## 2017-12-15 ENCOUNTER — Emergency Department (HOSPITAL_COMMUNITY): Payer: Medicare Other

## 2017-12-15 ENCOUNTER — Other Ambulatory Visit: Payer: Self-pay

## 2017-12-15 ENCOUNTER — Inpatient Hospital Stay (HOSPITAL_COMMUNITY)
Admission: EM | Admit: 2017-12-15 | Discharge: 2017-12-17 | DRG: 065 | Disposition: A | Discharge status: Rehabilitation Facility | Payer: Medicare Other | Attending: Internal Medicine | Admitting: Internal Medicine

## 2017-12-15 ENCOUNTER — Encounter (HOSPITAL_COMMUNITY): Payer: Self-pay | Admitting: Family Medicine

## 2017-12-15 DIAGNOSIS — D62 Acute posthemorrhagic anemia: Secondary | ICD-10-CM

## 2017-12-15 DIAGNOSIS — G459 Transient cerebral ischemic attack, unspecified: Secondary | ICD-10-CM | POA: Diagnosis not present

## 2017-12-15 DIAGNOSIS — R29818 Other symptoms and signs involving the nervous system: Secondary | ICD-10-CM | POA: Diagnosis not present

## 2017-12-15 DIAGNOSIS — F17210 Nicotine dependence, cigarettes, uncomplicated: Secondary | ICD-10-CM | POA: Diagnosis present

## 2017-12-15 DIAGNOSIS — I69992 Facial weakness following unspecified cerebrovascular disease: Secondary | ICD-10-CM | POA: Diagnosis not present

## 2017-12-15 DIAGNOSIS — R200 Anesthesia of skin: Secondary | ICD-10-CM | POA: Diagnosis not present

## 2017-12-15 DIAGNOSIS — I639 Cerebral infarction, unspecified: Secondary | ICD-10-CM | POA: Diagnosis not present

## 2017-12-15 DIAGNOSIS — R29702 NIHSS score 2: Secondary | ICD-10-CM | POA: Diagnosis present

## 2017-12-15 DIAGNOSIS — R531 Weakness: Secondary | ICD-10-CM

## 2017-12-15 DIAGNOSIS — I10 Essential (primary) hypertension: Secondary | ICD-10-CM

## 2017-12-15 DIAGNOSIS — R4701 Aphasia: Secondary | ICD-10-CM | POA: Diagnosis present

## 2017-12-15 DIAGNOSIS — R29700 NIHSS score 0: Secondary | ICD-10-CM | POA: Diagnosis not present

## 2017-12-15 DIAGNOSIS — I5032 Chronic diastolic (congestive) heart failure: Secondary | ICD-10-CM

## 2017-12-15 DIAGNOSIS — I110 Hypertensive heart disease with heart failure: Secondary | ICD-10-CM | POA: Diagnosis present

## 2017-12-15 DIAGNOSIS — Z90710 Acquired absence of both cervix and uterus: Secondary | ICD-10-CM

## 2017-12-15 DIAGNOSIS — Z880 Allergy status to penicillin: Secondary | ICD-10-CM

## 2017-12-15 DIAGNOSIS — I7389 Other specified peripheral vascular diseases: Secondary | ICD-10-CM | POA: Diagnosis present

## 2017-12-15 DIAGNOSIS — Z79899 Other long term (current) drug therapy: Secondary | ICD-10-CM

## 2017-12-15 DIAGNOSIS — Z8542 Personal history of malignant neoplasm of other parts of uterus: Secondary | ICD-10-CM

## 2017-12-15 DIAGNOSIS — Z96642 Presence of left artificial hip joint: Secondary | ICD-10-CM | POA: Diagnosis present

## 2017-12-15 DIAGNOSIS — G8191 Hemiplegia, unspecified affecting right dominant side: Secondary | ICD-10-CM | POA: Diagnosis present

## 2017-12-15 DIAGNOSIS — E785 Hyperlipidemia, unspecified: Secondary | ICD-10-CM | POA: Diagnosis present

## 2017-12-15 DIAGNOSIS — N390 Urinary tract infection, site not specified: Secondary | ICD-10-CM

## 2017-12-15 DIAGNOSIS — R29810 Facial weakness: Secondary | ICD-10-CM | POA: Diagnosis present

## 2017-12-15 DIAGNOSIS — E7800 Pure hypercholesterolemia, unspecified: Secondary | ICD-10-CM

## 2017-12-15 DIAGNOSIS — Z9049 Acquired absence of other specified parts of digestive tract: Secondary | ICD-10-CM

## 2017-12-15 DIAGNOSIS — Z716 Tobacco abuse counseling: Secondary | ICD-10-CM

## 2017-12-15 DIAGNOSIS — I6789 Other cerebrovascular disease: Secondary | ICD-10-CM | POA: Diagnosis not present

## 2017-12-15 LAB — COMPREHENSIVE METABOLIC PANEL
ALT: 9 U/L — ABNORMAL LOW (ref 14–54)
AST: 15 U/L (ref 15–41)
Albumin: 3.7 g/dL (ref 3.5–5.0)
Alkaline Phosphatase: 104 U/L (ref 38–126)
Anion gap: 11 (ref 5–15)
BUN: 23 mg/dL — ABNORMAL HIGH (ref 6–20)
CHLORIDE: 108 mmol/L (ref 101–111)
CO2: 23 mmol/L (ref 22–32)
Calcium: 8.5 mg/dL — ABNORMAL LOW (ref 8.9–10.3)
Creatinine, Ser: 0.84 mg/dL (ref 0.44–1.00)
Glucose, Bld: 111 mg/dL — ABNORMAL HIGH (ref 65–99)
POTASSIUM: 3.6 mmol/L (ref 3.5–5.1)
Sodium: 142 mmol/L (ref 135–145)
Total Bilirubin: 0.4 mg/dL (ref 0.3–1.2)
Total Protein: 6 g/dL — ABNORMAL LOW (ref 6.5–8.1)

## 2017-12-15 LAB — DIFFERENTIAL
BASOS PCT: 1 %
Basophils Absolute: 0 10*3/uL (ref 0.0–0.1)
EOS ABS: 0.1 10*3/uL (ref 0.0–0.7)
Eosinophils Relative: 3 %
Lymphocytes Relative: 25 %
Lymphs Abs: 1.3 10*3/uL (ref 0.7–4.0)
MONO ABS: 0.5 10*3/uL (ref 0.1–1.0)
MONOS PCT: 10 %
Neutro Abs: 3.2 10*3/uL (ref 1.7–7.7)
Neutrophils Relative %: 61 %

## 2017-12-15 LAB — I-STAT TROPONIN, ED: TROPONIN I, POC: 0 ng/mL (ref 0.00–0.08)

## 2017-12-15 LAB — CBC
HEMATOCRIT: 38.7 % (ref 36.0–46.0)
Hemoglobin: 13.2 g/dL (ref 12.0–15.0)
MCH: 30.6 pg (ref 26.0–34.0)
MCHC: 34.1 g/dL (ref 30.0–36.0)
MCV: 89.8 fL (ref 78.0–100.0)
Platelets: 206 10*3/uL (ref 150–400)
RBC: 4.31 MIL/uL (ref 3.87–5.11)
RDW: 12.8 % (ref 11.5–15.5)
WBC: 5.1 10*3/uL (ref 4.0–10.5)

## 2017-12-15 LAB — I-STAT CHEM 8, ED
BUN: 24 mg/dL — ABNORMAL HIGH (ref 6–20)
CHLORIDE: 103 mmol/L (ref 101–111)
Calcium, Ion: 1.16 mmol/L (ref 1.15–1.40)
Creatinine, Ser: 0.9 mg/dL (ref 0.44–1.00)
GLUCOSE: 107 mg/dL — AB (ref 65–99)
HCT: 35 % — ABNORMAL LOW (ref 36.0–46.0)
HEMOGLOBIN: 11.9 g/dL — AB (ref 12.0–15.0)
Potassium: 3.6 mmol/L (ref 3.5–5.1)
Sodium: 145 mmol/L (ref 135–145)
TCO2: 28 mmol/L (ref 22–32)

## 2017-12-15 LAB — APTT: APTT: 30 s (ref 24–36)

## 2017-12-15 LAB — CBG MONITORING, ED: Glucose-Capillary: 102 mg/dL — ABNORMAL HIGH (ref 65–99)

## 2017-12-15 LAB — PROTIME-INR
INR: 1.05
Prothrombin Time: 13.6 seconds (ref 11.4–15.2)

## 2017-12-15 MED ORDER — ASPIRIN 325 MG PO TABS
325.0000 mg | ORAL_TABLET | Freq: Every day | ORAL | Status: DC
  Administered 2017-12-16 – 2017-12-17 (×2): 325 mg via ORAL
  Filled 2017-12-15 (×2): qty 1

## 2017-12-15 MED ORDER — ASPIRIN 300 MG RE SUPP: 300.0000 mg | Freq: Every day | RECTAL | Status: DC

## 2017-12-15 MED ORDER — ENOXAPARIN SODIUM 40 MG/0.4ML ~~LOC~~ SOLN
40.0000 mg | Freq: Every day | SUBCUTANEOUS | Status: DC
  Administered 2017-12-16 – 2017-12-17 (×2): 40 mg via SUBCUTANEOUS
  Filled 2017-12-15 (×3): qty 0.4

## 2017-12-15 MED ORDER — LABETALOL HCL 5 MG/ML IV SOLN: 5.0000 mg | INTRAVENOUS | Status: DC | PRN

## 2017-12-15 MED ORDER — BRINZOLAMIDE 1 % OP SUSP
1.0000 [drp] | Freq: Two times a day (BID) | OPHTHALMIC | Status: DC
Start: 2017-12-15 — End: 2017-12-17
  Administered 2017-12-16 – 2017-12-17 (×3): 1 [drp] via OPHTHALMIC
  Filled 2017-12-15: qty 10

## 2017-12-15 MED ORDER — SENNOSIDES-DOCUSATE SODIUM 8.6-50 MG PO TABS: 1.0000 | ORAL_TABLET | Freq: Every evening | ORAL | Status: DC | PRN

## 2017-12-15 MED ORDER — ACETAMINOPHEN 160 MG/5ML PO SOLN: 650.0000 mg | ORAL | Status: DC | PRN

## 2017-12-15 MED ORDER — ATORVASTATIN CALCIUM 80 MG PO TABS
80.0000 mg | ORAL_TABLET | Freq: Every day | ORAL | Status: DC
  Administered 2017-12-16: 80 mg via ORAL
  Filled 2017-12-15 (×2): qty 1

## 2017-12-15 MED ORDER — LATANOPROST 0.005 % OP SOLN
1.0000 [drp] | Freq: Every day | OPHTHALMIC | Status: DC
  Administered 2017-12-16: 1 [drp] via OPHTHALMIC
  Filled 2017-12-15: qty 2.5

## 2017-12-15 MED ORDER — ACETAMINOPHEN 325 MG PO TABS: 650.0000 mg | ORAL_TABLET | ORAL | Status: DC | PRN

## 2017-12-15 MED ORDER — SODIUM CHLORIDE 0.9 % IV SOLN
INTRAVENOUS | Status: AC
  Administered 2017-12-16: via INTRAVENOUS

## 2017-12-15 MED ORDER — ADULT MULTIVITAMIN W/MINERALS CH
1.0000 | ORAL_TABLET | Freq: Every day | ORAL | Status: DC
  Administered 2017-12-16 – 2017-12-17 (×2): 1 via ORAL
  Filled 2017-12-15 (×2): qty 1

## 2017-12-15 MED ORDER — ACETAMINOPHEN 650 MG RE SUPP: 650.0000 mg | RECTAL | Status: DC | PRN

## 2017-12-15 MED ORDER — STROKE: EARLY STAGES OF RECOVERY BOOK
Freq: Once | Status: AC
  Administered 2017-12-16
  Filled 2017-12-15: qty 1

## 2017-12-15 NOTE — H&P (Signed)
History and Physical    [name] PYK:[id number] DOB: 02/18/1946 DOA: 12/15/2017  PCP: Briscoe Deutscher, MD   Patient coming from: Home  Chief Complaint: Right-sided weakness, aphasia   HPI: [name] is a 72 y.o. female with medical history significant for endometrial cancer status post hysterectomy, now presenting to the emergency department for evaluation of acute onset of right-sided weakness.  Patient reports that she had been experiencing some palpitations last night, had a nonspecific malaise this evening, and then while eating dinner with family, was noted to have difficulty using her utensils with her dominant right hand, had a right facial droop, and some speech difficulty.  She got up from the table and noted weakness involving the left arm and leg as well.  EMS was called and the patient was transported to the ED as a code stroke.  ED Course: Upon arrival to the ED, patient is found to be afebrile, saturating well on room air, and with vitals otherwise normal.  EKG features a sinus rhythm with diffuse ST-T abnormalities that are similar to priors.  Noncontrast head CT is negative for acute intracranial abnormality.  Chemistry panel is notable for an elevated BUN to creatinine ratio.  CBC is unremarkable.  Troponin is undetectable.  INR is normal.  Symptoms have steadily improved and nearly resolved in the ED.  Neurology has recommended a medical admission for ongoing evaluation and management of suspected acute ischemic CVA or TIA.  Review of Systems:  All other systems reviewed and apart from HPI, are negative.  Past Medical History:  Diagnosis Date  . Chronic cholecystitis   . Complication of anesthesia   . Diverticulosis   . Endometrial cancer ([place]) 1977   s/p TAH  . History of transfusion 1970's  . PONV (postoperative nausea and vomiting)     Past Surgical History:  Procedure Laterality Date  . ABDOMINAL HYSTERECTOMY  1977   for endometrial cancer  . HIP ARTHROPLASTY  Left 11/18/2016   Procedure: ARTHROPLASTY BIPOLAR HIP (HEMIARTHROPLASTY);  Surgeon: Nicholes Stairs, MD;  Location: [facility];  Service: Orthopedics;  Laterality: Left;  . LAPAROSCOPIC CHOLECYSTECTOMY SINGLE SITE WITH INTRAOPERATIVE CHOLANGIOGRAM N/A 07/19/2015   Procedure: LAPAROSCOPIC CHOLECYSTECTOMY SINGLE SITE WITH INTRAOPERATIVE CHOLANGIOGRAM;  Surgeon: Michael Boston, MD;  Location: [facility];  Service: General;  Laterality: N/A;  . TONSILLECTOMY    . TUBAL LIGATION       reports that she has been smoking.  she has never used smokeless tobacco. She reports that she does not drink alcohol or use drugs.  Allergies  Allergen Reactions  . Penicillins Nausea And Vomiting    Has patient had a PCN reaction causing immediate rash, facial/tongue/throat swelling, SOB or lightheadedness with hypotension: no Has patient had a PCN reaction causing severe rash involving mucus membranes or skin necrosis:no Has patient had a PCN reaction that required hospitalization no Has patient had a PCN reaction occurring within the last 10 years: no If all of the above answers are "NO", then may proceed with Cephalosporin use.    Family History  Problem Relation Age of Onset  . Obesity Daughter      Prior to Admission medications   Medication Sig Start Date End Date Taking? Authorizing Provider  brinzolamide (AZOPT) 1 % ophthalmic suspension Place 1 drop into both eyes 2 (two) times daily.   Yes [provider]  Cyanocobalamin (VITAMIN B-12 PO) Take 1 tablet by mouth daily.   Yes [provider]  ibuprofen (ADVIL,MOTRIN) 200 MG tablet Take  400-600 mg by mouth every 6 (six) hours as needed (pain).   Yes [provider]  Latanoprostene Bunod (VYZULTA) 0.024 % SOLN Place 1 drop into both eyes at bedtime.   Yes [provider]  Multiple Vitamin (MULTIVITAMIN WITH MINERALS) TABS tablet Take 1 tablet by mouth daily.   Yes [provider]    Physical Exam: Vitals:    12/15/17 2100 12/15/17 2115 12/15/17 2230 12/15/17 2231  BP: (!) 157/65 137/66 (!) 154/67 (!) 154/67  Pulse: 72 71 65 67  Resp: (!) 22 12 (!) 21 20  Temp:      TempSrc:      SpO2: 97% 98% 96% 96%  Weight:          Constitutional: NAD, calm, comfortable Eyes: PERTLA, lids and conjunctivae normal ENMT: Mucous membranes are moist. Posterior pharynx clear of any exudate or lesions.   Neck: normal, supple, no masses, no thyromegaly Respiratory: Slightly diminished bilaterally, no wheezing, no crackles. Normal respiratory effort.   Cardiovascular: S1 & S2 heard, regular rate and rhythm. No extremity edema. No significant JVD. Abdomen: No distension, no tenderness, no masses palpated. Bowel sounds normal.  Musculoskeletal: no clubbing / cyanosis. No joint deformity upper and lower extremities.    Skin: no significant rashes, lesions, ulcers. Warm, dry, well-perfused. Neurologic: CN 2-12 grossly intact. Sensation intact. Strength 5/5 in all 4 limbs.  Psychiatric: Alert and oriented x 3. Calm, cooperative.     Labs on Admission: I have personally reviewed following labs and imaging studies  CBC: Recent Labs  Lab 12/15/17 2029 12/15/17 2105  WBC 5.1  --   NEUTROABS 3.2  --   HGB 13.2 11.9*  HCT 38.7 35.0*  MCV 89.8  --   PLT 206  --    Basic Metabolic Panel: Recent Labs  Lab 12/15/17 2029 12/15/17 2105  NA 142 145  K 3.6 3.6  CL 108 103  CO2 23  --   GLUCOSE 111* 107*  BUN 23* 24*  CREATININE 0.84 0.90  CALCIUM 8.5*  --    GFR: CrCl cannot be calculated (Unknown ideal weight.). Liver Function Tests: Recent Labs  Lab 12/15/17 2029  AST 15  ALT 9*  ALKPHOS 104  BILITOT 0.4  PROT 6.0*  ALBUMIN 3.7   No results for input(s): LIPASE, AMYLASE in the last 168 hours. No results for input(s): AMMONIA in the last 168 hours. Coagulation Profile: Recent Labs  Lab 12/15/17 2029  INR 1.05   Cardiac Enzymes: No results for input(s): CKTOTAL, CKMB, CKMBINDEX,  TROPONINI in the last 168 hours. BNP (last 3 results) No results for input(s): PROBNP in the last 8760 hours. HbA1C: No results for input(s): HGBA1C in the last 72 hours. CBG: Recent Labs  Lab 12/15/17 2036  GLUCAP 102*   Lipid Profile: No results for input(s): CHOL, HDL, LDLCALC, TRIG, CHOLHDL, LDLDIRECT in the last 72 hours. Thyroid Function Tests: No results for input(s): TSH, T4TOTAL, FREET4, T3FREE, THYROIDAB in the last 72 hours. Anemia Panel: No results for input(s): VITAMINB12, FOLATE, FERRITIN, TIBC, IRON, RETICCTPCT in the last 72 hours. Urine analysis: No results found for: COLORURINE, APPEARANCEUR, LABSPEC, PHURINE, GLUCOSEU, HGBUR, BILIRUBINUR, KETONESUR, PROTEINUR, UROBILINOGEN, NITRITE, LEUKOCYTESUR Sepsis Labs: @LABRCNTIP (procalcitonin:4,lacticidven:4) )No results found for this or any previous visit (from the past 240 hour(s)).   Radiological Exams on Admission: Ct Head Code Stroke Wo Contrast  Result Date: 12/15/2017 CLINICAL DATA:  Code stroke. Right-sided deficit, confusion, aphasia EXAM: CT HEAD WITHOUT CONTRAST TECHNIQUE: Contiguous axial images were obtained  from the base of the skull through the vertex without intravenous contrast. COMPARISON:  None. FINDINGS: Brain: Mild to moderate atrophy. Moderate to advanced chronic appearing white matter changes diffusely. Negative for acute cortical infarct. Negative for hemorrhage or mass. No midline shift. Negative for hydrocephalus Vascular: Negative for hyperdense vessel Skull: Negative Sinuses/Orbits: Paranasal sinuses clear. Bilateral cataract removal. Other: None ASPECTS ([place] Stroke Program Early CT Score) - Ganglionic level infarction (caudate, lentiform nuclei, internal capsule, insula, M1-M3 cortex): 7 - Supraganglionic infarction (M4-M6 cortex): 3 Total score (0-10 with 10 being normal): 10 IMPRESSION: 1. No acute intracranial abnormality 2. ASPECTS is 10 3. Atrophy and moderate to advanced chronic microvascular  ischemia in the white matter. Electronically Signed   By: Franchot Gallo M.D.   On: 12/15/2017 20:55    EKG: Independently reviewed. Sinus rhythm, diffuse ST-T abnormality similar to priors.   Assessment/Plan  1. Acute CVA vs TIA  - Presents with acute-onset of right-sided weakness and aphasia, improving in ED and nearly resolved so tPA held  - Reports palpitations the night before  - Head CT negative for acute intracranial abnormality  - Basic labs and vitals unremarkable  - Neurology is consulting and much appreciated - Obtain MRI brain, MRA head and neck, echocardiogram, fasting lipid panel, and A1c  - Continue cardiac monitoring with frequent neuro checks, PT/OT/SLP evals, permissive HTN  - Start ASA 325 and Lipitor 80  2. Current smoker  - Counseled toward cessation  - RN to provide smoking cessation information prior to discharge     DVT prophylaxis: Lovenox Code Status: Full  Family Communication: Daughter updated at bedside Disposition Plan: Observe on telemetry Consults called: Neurology Admission status: Observation    Vianne Bulls, MD [facility] Pager [phone]  If 7PM-7AM, please contact night-coverage [email or web address] Password [email or web address]  12/15/2017, 10:40 PM

## 2017-12-15 NOTE — ED Triage Notes (Signed)
Pt BIB [facility] for right sided weakness. LKW 1900. Pt was eating dinner and family noticed a right sided facial droop and then upon getting up right sided arm and leg weakness. Pt states she feels weak on the right side. No hx of CVA and not on anticoagulation. EMS states patient had an abnormal gait on arrival

## 2017-12-15 NOTE — Progress Notes (Signed)
[name] is a 72 yo female presenting to ED via [facility] with acute onset Right side weakness and a slight Ride side facial droop. On EMS arrival pt  Aphasic, Ride side facial droop and Right side deficits. During dinner family noted pt was unable to continue to hold her fork. LKW 1900, NIH 2, CBG 102. Pt to be admitted to [facility] for TIA work up.

## 2017-12-15 NOTE — ED Provider Notes (Signed)
[facility]W PROGRESSIVE CARE Provider Note   CSN: [id number] Arrival date & time: 12/15/17  2030   An emergency department physician performed an initial assessment on this suspected stroke patient at 2030.  History   Chief Complaint Chief Complaint  Patient presents with  . Code Stroke    HPI [name] is a 72 y.o. female.  Patient with history of endometrial cancer, cholecystitis, present to the ER with concern for stroke. At 1900 this evening patient is eating dinner and family noticed right-sided facial droop and right-sided arm and leg weakness. The symptoms have been improving and almost completely resolved. No history of stroke. No blood thinners. Patient had abnormal gait. EMS. No fevers or chills. No other symptoms.      Past Medical History:  Diagnosis Date  . Chronic cholecystitis   . Complication of anesthesia   . Diverticulosis   . Endometrial cancer ([place]) 1977   s/p TAH  . History of transfusion 1970's  . PONV (postoperative nausea and vomiting)     Patient Active Problem List   Diagnosis Date Noted  . Right sided weakness 12/15/2017  . Aphasia 12/15/2017  . Acute CVA (cerebrovascular accident) ([place]) 12/15/2017  . Closed fracture of left hip ([place]) 11/18/2016  . Tobacco abuse   . Chronic cholecystitis s/p lap chole 07/19/2015 07/19/2015    Past Surgical History:  Procedure Laterality Date  . ABDOMINAL HYSTERECTOMY  1977   for endometrial cancer  . HIP ARTHROPLASTY Left 11/18/2016   Procedure: ARTHROPLASTY BIPOLAR HIP (HEMIARTHROPLASTY);  Surgeon: Nicholes Stairs, MD;  Location: [facility];  Service: Orthopedics;  Laterality: Left;  . LAPAROSCOPIC CHOLECYSTECTOMY SINGLE SITE WITH INTRAOPERATIVE CHOLANGIOGRAM N/A 07/19/2015   Procedure: LAPAROSCOPIC CHOLECYSTECTOMY SINGLE SITE WITH INTRAOPERATIVE CHOLANGIOGRAM;  Surgeon: Michael Boston, MD;  Location: [facility];  Service: General;  Laterality: N/A;  . TONSILLECTOMY    . TUBAL LIGATION      OB History     No data available       Home Medications    Prior to Admission medications   Medication Sig Start Date End Date Taking? Authorizing Provider  brinzolamide (AZOPT) 1 % ophthalmic suspension Place 1 drop into both eyes 2 (two) times daily.   Yes [provider]  Cyanocobalamin (VITAMIN B-12 PO) Take 1 tablet by mouth daily.   Yes [provider]  ibuprofen (ADVIL,MOTRIN) 200 MG tablet Take 400-600 mg by mouth every 6 (six) hours as needed (pain).   Yes [provider]  Latanoprostene Bunod (VYZULTA) 0.024 % SOLN Place 1 drop into both eyes at bedtime.   Yes [provider]  Multiple Vitamin (MULTIVITAMIN WITH MINERALS) TABS tablet Take 1 tablet by mouth daily.   Yes [provider]    Family History Family History  Problem Relation Age of Onset  . Obesity Daughter     Social History Social History   Tobacco Use  . Smoking status: Current Every Day Smoker  . Smokeless tobacco: Never Used  Substance Use Topics  . Alcohol use: No  . Drug use: No     Allergies   Penicillins   Review of Systems Review of Systems  Constitutional: Negative for chills and fever.  HENT: Negative for congestion.   Eyes: Negative for visual disturbance.  Respiratory: Negative for shortness of breath.   Cardiovascular: Negative for chest pain.  Gastrointestinal: Negative for abdominal pain and vomiting.  Genitourinary: Negative for dysuria and flank pain.  Musculoskeletal: Negative for back pain, neck pain and  neck stiffness.  Skin: Negative for rash.  Neurological: Positive for weakness and numbness. Negative for light-headedness and headaches.     Physical Exam Updated Vital Signs BP (!) 157/58 (BP Location: Right Arm)   Pulse 67   Temp 98.2 F (36.8 C) (Oral)   Resp 18   Ht 5\' 4"  (1.626 m)   Wt 61.4 kg (135 lb 5.8 oz)   SpO2 99%   BMI 23.23 kg/m   Physical Exam  Constitutional: She appears well-developed and well-nourished. No  distress.  HENT:  Head: Normocephalic and atraumatic.  Eyes: Conjunctivae are normal.  Neck: Neck supple.  Cardiovascular: Normal rate and regular rhythm.  No murmur heard. Pulmonary/Chest: Effort normal and breath sounds normal. No respiratory distress.  Abdominal: Soft. There is no tenderness.  Musculoskeletal: She exhibits no edema.  Neurological: She is alert. No cranial nerve deficit.  5+ strength in UE and LE with f/e at major joints. Mild right arm drift. Sensation to palpation intact in UE and LE. CNs 2-12 grossly intact.  EOMFI.  PERRL.   Finger nose and coordination intact bilateral.   Visual fields intact to finger testing. No nystagmus   Skin: Skin is warm and dry.  Psychiatric: She has a normal mood and affect.  Nursing note and vitals reviewed.    ED Treatments / Results  Labs (all labs ordered are listed, but only abnormal results are displayed) Labs Reviewed  COMPREHENSIVE METABOLIC PANEL - Abnormal; Notable for the following components:      Result Value   Glucose, Bld 111 (*)    BUN 23 (*)    Calcium 8.5 (*)    Total Protein 6.0 (*)    ALT 9 (*)    All other components within normal limits  CBG MONITORING, ED - Abnormal; Notable for the following components:   Glucose-Capillary 102 (*)    All other components within normal limits  I-STAT CHEM 8, ED - Abnormal; Notable for the following components:   BUN 24 (*)    Glucose, Bld 107 (*)    Hemoglobin 11.9 (*)    HCT 35.0 (*)    All other components within normal limits  PROTIME-INR  APTT  CBC  DIFFERENTIAL  HEMOGLOBIN A1C  LIPID PANEL  I-STAT TROPONIN, ED    EKG  EKG Interpretation None       Radiology Ct Head Code Stroke Wo Contrast  Result Date: 12/15/2017 CLINICAL DATA:  Code stroke. Right-sided deficit, confusion, aphasia EXAM: CT HEAD WITHOUT CONTRAST TECHNIQUE: Contiguous axial images were obtained from the base of the skull through the vertex without intravenous contrast.  COMPARISON:  None. FINDINGS: Brain: Mild to moderate atrophy. Moderate to advanced chronic appearing white matter changes diffusely. Negative for acute cortical infarct. Negative for hemorrhage or mass. No midline shift. Negative for hydrocephalus Vascular: Negative for hyperdense vessel Skull: Negative Sinuses/Orbits: Paranasal sinuses clear. Bilateral cataract removal. Other: None ASPECTS ([place] Stroke Program Early CT Score) - Ganglionic level infarction (caudate, lentiform nuclei, internal capsule, insula, M1-M3 cortex): 7 - Supraganglionic infarction (M4-M6 cortex): 3 Total score (0-10 with 10 being normal): 10 IMPRESSION: 1. No acute intracranial abnormality 2. ASPECTS is 10 3. Atrophy and moderate to advanced chronic microvascular ischemia in the white matter. Electronically Signed   By: Franchot Gallo M.D.   On: 12/15/2017 20:55    Procedures Procedures (including critical care time)  Medications Ordered in ED Medications  brinzolamide (AZOPT) 1 % ophthalmic suspension 1 drop (not administered)  latanoprost (XALATAN) 0.005 %  ophthalmic solution 1 drop (not administered)  multivitamin with minerals tablet 1 tablet (not administered)  0.9 %  sodium chloride infusion ( Intravenous New Bag/Given 12/16/17 0011)  acetaminophen (TYLENOL) tablet 650 mg (not administered)    Or  acetaminophen (TYLENOL) solution 650 mg (not administered)    Or  acetaminophen (TYLENOL) suppository 650 mg (not administered)  senna-docusate (Senokot-S) tablet 1 tablet (not administered)  enoxaparin (LOVENOX) injection 40 mg (not administered)  aspirin suppository 300 mg (not administered)    Or  aspirin tablet 325 mg (not administered)  atorvastatin (LIPITOR) tablet 80 mg (80 mg Oral Not Given 12/16/17 0011)  labetalol (NORMODYNE,TRANDATE) injection 5-10 mg (not administered)   stroke: mapping our early stages of recovery book ( Does not apply Given 12/16/17 0011)     Initial Impression / Assessment and Plan /  ED Course  I have reviewed the triage vital signs and the nursing notes.  Pertinent labs & imaging results that were available during my care of the patient were reviewed by me and considered in my medical decision making (see chart for details).    Patient presents with clinical concern for stroke. Patient is within a time window however her symptoms have resolved except for mild arm drift on the right. Neurology code stroke team at the bedside. Plan for observation the hospital and further workup. CT scan negative. No indication for TPA at this time. The patients results and plan were reviewed and discussed.   Any x-rays performed were independently reviewed by myself.   Differential diagnosis were considered with the presenting HPI.  Medications  brinzolamide (AZOPT) 1 % ophthalmic suspension 1 drop (not administered)  latanoprost (XALATAN) 0.005 % ophthalmic solution 1 drop (not administered)  multivitamin with minerals tablet 1 tablet (not administered)  0.9 %  sodium chloride infusion ( Intravenous New Bag/Given 12/16/17 0011)  acetaminophen (TYLENOL) tablet 650 mg (not administered)    Or  acetaminophen (TYLENOL) solution 650 mg (not administered)    Or  acetaminophen (TYLENOL) suppository 650 mg (not administered)  senna-docusate (Senokot-S) tablet 1 tablet (not administered)  enoxaparin (LOVENOX) injection 40 mg (not administered)  aspirin suppository 300 mg (not administered)    Or  aspirin tablet 325 mg (not administered)  atorvastatin (LIPITOR) tablet 80 mg (80 mg Oral Not Given 12/16/17 0011)  labetalol (NORMODYNE,TRANDATE) injection 5-10 mg (not administered)   stroke: mapping our early stages of recovery book ( Does not apply Given 12/16/17 0011)    Vitals:   12/15/17 2230 12/15/17 2231 12/15/17 2300 12/16/17 0000  BP: (!) 154/67 (!) 154/67 (!) 139/58 (!) 157/58  Pulse: 65 67 66 67  Resp: (!) 21 20 (!) 21 18  Temp:    98.2 F (36.8 C)  TempSrc:    Oral  SpO2: 96%  96% 97% 99%  Weight:    61.4 kg (135 lb 5.8 oz)  Height:    5\' 4"  (1.626 m)    Final diagnoses:  TIA (transient ischemic attack)    Admission/ observation were discussed with the admitting physician, patient and/or family and they are comfortable with the plan.    Final Clinical Impressions(s) / ED Diagnoses   Final diagnoses:  TIA (transient ischemic attack)    ED Discharge Orders    None       Elnora Morrison, MD 12/16/17 [phone]

## 2017-12-15 NOTE — ED Notes (Signed)
Attempted to call report

## 2017-12-15 NOTE — Consult Note (Signed)
Requesting Physician: Dr. Reather Converse    Chief Complaint: Aphasia, right side weakness  History obtained from: Patient and Chart    HPI:                                                                                                                                       [name] is an 72 y.o. female history of endometrial cancer, cholecystitis, smoker who presents with sudden onset aphasia and right-sided weakness.  Around 7 PM the patient was having dinner and suddenly noticed that she had facial droop and right arm and leg weakness. The patient also states she had difficulty getting words out and slurred speech. EMS was called and her symptoms have been resolving at the time she arrived. She was initially scored NIH 2  on arrival for mild drift and sensory symptoms however eventually her symptoms resolved completely by this and she complete her CT scan. CT head was negative for any hemorrhage.   Date last known well:  Time last known well:  7pm tPA Given: no, resolved symptoms  NIHSS: 0 Baseline MRS 0     Past Medical History:  Diagnosis Date  . Chronic cholecystitis   . Complication of anesthesia   . Diverticulosis   . Endometrial cancer ([place]) 1977   s/p TAH  . History of transfusion 1970's  . PONV (postoperative nausea and vomiting)     Past Surgical History:  Procedure Laterality Date  . ABDOMINAL HYSTERECTOMY  1977   for endometrial cancer  . HIP ARTHROPLASTY Left 11/18/2016   Procedure: ARTHROPLASTY BIPOLAR HIP (HEMIARTHROPLASTY);  Surgeon: Nicholes Stairs, MD;  Location: [facility];  Service: Orthopedics;  Laterality: Left;  . LAPAROSCOPIC CHOLECYSTECTOMY SINGLE SITE WITH INTRAOPERATIVE CHOLANGIOGRAM N/A 07/19/2015   Procedure: LAPAROSCOPIC CHOLECYSTECTOMY SINGLE SITE WITH INTRAOPERATIVE CHOLANGIOGRAM;  Surgeon: Michael Boston, MD;  Location: [facility];  Service: General;  Laterality: N/A;  . TONSILLECTOMY    . TUBAL LIGATION      Family History  Problem Relation Age of  Onset  . Obesity Daughter    Social History:  reports that she has been smoking.  she has never used smokeless tobacco. She reports that she does not drink alcohol or use drugs.  Allergies:  Allergies  Allergen Reactions  . Penicillins Nausea And Vomiting    Has patient had a PCN reaction causing immediate rash, facial/tongue/throat swelling, SOB or lightheadedness with hypotension: no Has patient had a PCN reaction causing severe rash involving mucus membranes or skin necrosis:no Has patient had a PCN reaction that required hospitalization no Has patient had a PCN reaction occurring within the last 10 years: no If all of the above answers are "NO", then may proceed with Cephalosporin use.    Medications:  I reviewed home medications   ROS:                                                                                                                                     14 systems reviewed and negative except above    Examination:                                                                                                      General: Appears well-developed and well-nourished.  Psych: Affect appropriate to situation Eyes: No scleral injection HENT: No OP obstrucion Head: Normocephalic.  Cardiovascular: Normal rate and regular rhythm.  Respiratory: Effort normal and breath sounds normal to anterior ascultation GI: Soft.  No distension. There is no tenderness.  Skin: WDI   Neurological Examination Mental Status: Alert, oriented, thought content appropriate.  Speech fluent without evidence of aphasia. Able to follow 3 step commands without difficulty. Cranial Nerves: II: Visual fields grossly normal,  III,IV, VI: ptosis not present, extra-ocular motions intact bilaterally, pupils equal, round, reactive to light and accommodation V,VII: smile symmetric,  facial light touch sensation normal bilaterally VIII: hearing normal bilaterally IX,X: uvula rises symmetrically XI: bilateral shoulder shrug XII: midline tongue extension Motor: Right : Upper extremity   5/5    Left:     Upper extremity   5/5  Lower extremity   5/5     Lower extremity   5/5 Tone and bulk:normal tone throughout; no atrophy noted Sensory: Pinprick and light touch intact throughout, bilaterally Deep Tendon Reflexes: 2+ and symmetric throughout Plantars: Right: downgoing   Left: downgoing Cerebellar: normal finger-to-nose, normal rapid alternating movements and normal heel-to-shin test Gait: normal gait and station     Lab Results: Basic Metabolic Panel: Recent Labs  Lab 12/15/17 2029 12/15/17 2105  NA 142 145  K 3.6 3.6  CL 108 103  CO2 23  --   GLUCOSE 111* 107*  BUN 23* 24*  CREATININE 0.84 0.90  CALCIUM 8.5*  --     CBC: Recent Labs  Lab 12/15/17 2029 12/15/17 2105  WBC 5.1  --   NEUTROABS 3.2  --   HGB 13.2 11.9*  HCT 38.7 35.0*  MCV 89.8  --   PLT 206  --     Coagulation Studies: Recent Labs    12/15/17 2029  LABPROT 13.6  INR 1.05    Imaging: Mr Jodene Nam Neck W Wo Contrast  Result Date: 12/16/2017 CLINICAL DATA:  New onset aphasia and right-sided weakness EXAM:  MR HEAD WITHOUT CONTRAST MR CIRCLE OF WILLIS WITHOUT CONTRAST MRA OF THE NECK WITHOUT AND WITH CONTRAST TECHNIQUE: Multiplanar, multiecho pulse sequences of the brain, circle of willis and surrounding structures were obtained without intravenous contrast. Angiographic images of the neck were obtained using MRA technique without and with intravenous contrast. CONTRAST:  51mL MULTIHANCE GADOBENATE DIMEGLUMINE 529 MG/ML IV SOLN COMPARISON:  Head CT 12/15/2017 FINDINGS: MRI HEAD FINDINGS Brain: The midline structures are normal. There is abnormal diffusion restriction in the left corpus striatum. No acute hemorrhage. There is severe periventricular, juxtacortical and deep white matter  leukoaraiosis, most commonly seen in the setting of chronic ischemic microangiopathy. No mass lesion. No chronic microhemorrhage or cerebral amyloid angiopathy. No hydrocephalus, age advanced atrophy or lobar predominant volume loss. No dural abnormality or extra-axial collection. Skull and upper cervical spine: The visualized skull base, calvarium, upper cervical spine and extracranial soft tissues are normal. Sinuses/Orbits: No fluid levels or advanced mucosal thickening. No mastoid effusion. Normal orbits. MRA HEAD FINDINGS Intracranial internal carotid arteries: Normal. Anterior cerebral arteries: Normal. Middle cerebral arteries: Normal. Posterior communicating arteries: Present on the right. Posterior cerebral arteries: Normal. Basilar artery: Normal. Vertebral arteries: Left dominant. Normal. Superior cerebellar arteries: Normal. Anterior inferior cerebellar arteries: Normal. Posterior inferior cerebellar arteries: Normal. MRA NECK FINDINGS Aortic arch: Normal 3 vessel aortic branching pattern. The visualized subclavian arteries are normal. Right carotid system: Normal course and caliber without stenosis or evidence of dissection. Left carotid system: Normal course and caliber without stenosis or evidence of dissection. Vertebral arteries: Left dominant. Vertebral artery origins are poorly visualized. Vertebral arteries are normal in course and caliber to the vertebrobasilar confluence without stenosis or evidence of dissection. IMPRESSION: 1. 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 2. Severe chronic ischemic microangiopathy. 3. No emergent large vessel occlusion or high-grade intracranial stenosis. 4. No flow limiting stenosis of the carotid or vertebral arteries. Electronically Signed   By: Ulyses Jarred M.D.   On: 12/16/2017 02:16   Mr Brain Wo Contrast  Result Date: 12/16/2017 CLINICAL DATA:  New onset aphasia and right-sided weakness  EXAM: MR HEAD WITHOUT CONTRAST MR CIRCLE OF WILLIS WITHOUT CONTRAST MRA OF THE NECK WITHOUT AND WITH CONTRAST TECHNIQUE: Multiplanar, multiecho pulse sequences of the brain, circle of willis and surrounding structures were obtained without intravenous contrast. Angiographic images of the neck were obtained using MRA technique without and with intravenous contrast. CONTRAST:  76mL MULTIHANCE GADOBENATE DIMEGLUMINE 529 MG/ML IV SOLN COMPARISON:  Head CT 12/15/2017 FINDINGS: MRI HEAD FINDINGS Brain: The midline structures are normal. There is abnormal diffusion restriction in the left corpus striatum. No acute hemorrhage. There is severe periventricular, juxtacortical and deep white matter leukoaraiosis, most commonly seen in the setting of chronic ischemic microangiopathy. No mass lesion. No chronic microhemorrhage or cerebral amyloid angiopathy. No hydrocephalus, age advanced atrophy or lobar predominant volume loss. No dural abnormality or extra-axial collection. Skull and upper cervical spine: The visualized skull base, calvarium, upper cervical spine and extracranial soft tissues are normal. Sinuses/Orbits: No fluid levels or advanced mucosal thickening. No mastoid effusion. Normal orbits. MRA HEAD FINDINGS Intracranial internal carotid arteries: Normal. Anterior cerebral arteries: Normal. Middle cerebral arteries: Normal. Posterior communicating arteries: Present on the right. Posterior cerebral arteries: Normal. Basilar artery: Normal. Vertebral arteries: Left dominant. Normal. Superior cerebellar arteries: Normal. Anterior inferior cerebellar arteries: Normal. Posterior inferior cerebellar arteries: Normal. MRA NECK FINDINGS Aortic arch: Normal 3 vessel aortic branching pattern. The visualized subclavian arteries are normal.  Right carotid system: Normal course and caliber without stenosis or evidence of dissection. Left carotid system: Normal course and caliber without stenosis or evidence of dissection.  Vertebral arteries: Left dominant. Vertebral artery origins are poorly visualized. Vertebral arteries are normal in course and caliber to the vertebrobasilar confluence without stenosis or evidence of dissection. IMPRESSION: 1. 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 2. Severe chronic ischemic microangiopathy. 3. No emergent large vessel occlusion or high-grade intracranial stenosis. 4. No flow limiting stenosis of the carotid or vertebral arteries. Electronically Signed   By: Ulyses Jarred M.D.   On: 12/16/2017 02:16   Mr Jodene Nam Head Wo Contrast  Result Date: 12/16/2017 CLINICAL DATA:  New onset aphasia and right-sided weakness EXAM: MR HEAD WITHOUT CONTRAST MR CIRCLE OF WILLIS WITHOUT CONTRAST MRA OF THE NECK WITHOUT AND WITH CONTRAST TECHNIQUE: Multiplanar, multiecho pulse sequences of the brain, circle of willis and surrounding structures were obtained without intravenous contrast. Angiographic images of the neck were obtained using MRA technique without and with intravenous contrast. CONTRAST:  51mL MULTIHANCE GADOBENATE DIMEGLUMINE 529 MG/ML IV SOLN COMPARISON:  Head CT 12/15/2017 FINDINGS: MRI HEAD FINDINGS Brain: The midline structures are normal. There is abnormal diffusion restriction in the left corpus striatum. No acute hemorrhage. There is severe periventricular, juxtacortical and deep white matter leukoaraiosis, most commonly seen in the setting of chronic ischemic microangiopathy. No mass lesion. No chronic microhemorrhage or cerebral amyloid angiopathy. No hydrocephalus, age advanced atrophy or lobar predominant volume loss. No dural abnormality or extra-axial collection. Skull and upper cervical spine: The visualized skull base, calvarium, upper cervical spine and extracranial soft tissues are normal. Sinuses/Orbits: No fluid levels or advanced mucosal thickening. No mastoid effusion. Normal orbits. MRA HEAD FINDINGS Intracranial  internal carotid arteries: Normal. Anterior cerebral arteries: Normal. Middle cerebral arteries: Normal. Posterior communicating arteries: Present on the right. Posterior cerebral arteries: Normal. Basilar artery: Normal. Vertebral arteries: Left dominant. Normal. Superior cerebellar arteries: Normal. Anterior inferior cerebellar arteries: Normal. Posterior inferior cerebellar arteries: Normal. MRA NECK FINDINGS Aortic arch: Normal 3 vessel aortic branching pattern. The visualized subclavian arteries are normal. Right carotid system: Normal course and caliber without stenosis or evidence of dissection. Left carotid system: Normal course and caliber without stenosis or evidence of dissection. Vertebral arteries: Left dominant. Vertebral artery origins are poorly visualized. Vertebral arteries are normal in course and caliber to the vertebrobasilar confluence without stenosis or evidence of dissection. IMPRESSION: 1. 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 2. Severe chronic ischemic microangiopathy. 3. No emergent large vessel occlusion or high-grade intracranial stenosis. 4. No flow limiting stenosis of the carotid or vertebral arteries. Electronically Signed   By: Ulyses Jarred M.D.   On: 12/16/2017 02:16   Ct Head Code Stroke Wo Contrast  Result Date: 12/15/2017 CLINICAL DATA:  Code stroke. Right-sided deficit, confusion, aphasia EXAM: CT HEAD WITHOUT CONTRAST TECHNIQUE: Contiguous axial images were obtained from the base of the skull through the vertex without intravenous contrast. COMPARISON:  None. FINDINGS: Brain: Mild to moderate atrophy. Moderate to advanced chronic appearing white matter changes diffusely. Negative for acute cortical infarct. Negative for hemorrhage or mass. No midline shift. Negative for hydrocephalus Vascular: Negative for hyperdense vessel Skull: Negative Sinuses/Orbits: Paranasal sinuses clear. Bilateral cataract  removal. Other: None ASPECTS ([place]son Stroke Program Early CT Score) - Ganglionic level infarction (caudate, lentiform nuclei, internal capsule, insula, M1-M3 cortex): 7 - Supraganglionic infarction (M4-M6 cortex): 3 Total score (  0-10 with 10 being normal): 10 IMPRESSION: 1. No acute intracranial abnormality 2. ASPECTS is 10 3. Atrophy and moderate to advanced chronic microvascular ischemia in the white matter. Electronically Signed   By: Franchot Gallo M.D.   On: 12/15/2017 20:55     ASSESSMENT AND PLAN  72 year old female history of tobacco use, cancer presents with R side weakness, aphasia at 7pm. Her symptoms resolved completely shortly after arrival . She was admitted to the medicine team. 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 MRI head and neck did not show significant stenosis  Risk factors: cancer, tobacco use Etiology: Likely small vessel   Recommend  #Transthoracic Echo  # Start patient on ASA 325mg  daily #Start or continue Atorvastatin 80 mg/other high intensity statin # BP goal: permissive HTN upto 505 systolic, PRNs above 21 # HBAIC and Lipid profile # Telemetry monitoring # Frequent neuro checks # NPO until passes stroke swallow screen #Tobacco cessation counseling   Please page stroke NP  Or  PA  Or MD from 8am -4 pm  as this patient from this time will be  followed by the stroke.   You can look them up on [email or web address]  Password [facility]   Aleigha Gilani [facility] Pager Number [phone]

## 2017-12-16 ENCOUNTER — Observation Stay (HOSPITAL_COMMUNITY): Payer: Medicare Other

## 2017-12-16 ENCOUNTER — Observation Stay (HOSPITAL_BASED_OUTPATIENT_CLINIC_OR_DEPARTMENT_OTHER): Payer: Medicare Other

## 2017-12-16 DIAGNOSIS — I69392 Facial weakness following cerebral infarction: Secondary | ICD-10-CM | POA: Diagnosis not present

## 2017-12-16 DIAGNOSIS — Z90710 Acquired absence of both cervix and uterus: Secondary | ICD-10-CM | POA: Diagnosis not present

## 2017-12-16 DIAGNOSIS — I5032 Chronic diastolic (congestive) heart failure: Secondary | ICD-10-CM | POA: Diagnosis present

## 2017-12-16 DIAGNOSIS — I69398 Other sequelae of cerebral infarction: Secondary | ICD-10-CM | POA: Diagnosis not present

## 2017-12-16 DIAGNOSIS — I361 Nonrheumatic tricuspid (valve) insufficiency: Secondary | ICD-10-CM

## 2017-12-16 DIAGNOSIS — E7800 Pure hypercholesterolemia, unspecified: Secondary | ICD-10-CM | POA: Diagnosis present

## 2017-12-16 DIAGNOSIS — N390 Urinary tract infection, site not specified: Secondary | ICD-10-CM | POA: Diagnosis not present

## 2017-12-16 DIAGNOSIS — R29810 Facial weakness: Secondary | ICD-10-CM | POA: Diagnosis present

## 2017-12-16 DIAGNOSIS — G8191 Hemiplegia, unspecified affecting right dominant side: Secondary | ICD-10-CM | POA: Diagnosis not present

## 2017-12-16 DIAGNOSIS — R29700 NIHSS score 0: Secondary | ICD-10-CM | POA: Diagnosis not present

## 2017-12-16 DIAGNOSIS — R269 Unspecified abnormalities of gait and mobility: Secondary | ICD-10-CM | POA: Diagnosis not present

## 2017-12-16 DIAGNOSIS — Z720 Tobacco use: Secondary | ICD-10-CM | POA: Diagnosis not present

## 2017-12-16 DIAGNOSIS — Z96642 Presence of left artificial hip joint: Secondary | ICD-10-CM | POA: Diagnosis present

## 2017-12-16 DIAGNOSIS — E876 Hypokalemia: Secondary | ICD-10-CM | POA: Diagnosis present

## 2017-12-16 DIAGNOSIS — R4701 Aphasia: Secondary | ICD-10-CM | POA: Diagnosis present

## 2017-12-16 DIAGNOSIS — Z716 Tobacco abuse counseling: Secondary | ICD-10-CM | POA: Diagnosis not present

## 2017-12-16 DIAGNOSIS — F17210 Nicotine dependence, cigarettes, uncomplicated: Secondary | ICD-10-CM | POA: Diagnosis present

## 2017-12-16 DIAGNOSIS — Z79899 Other long term (current) drug therapy: Secondary | ICD-10-CM | POA: Diagnosis not present

## 2017-12-16 DIAGNOSIS — F17200 Nicotine dependence, unspecified, uncomplicated: Secondary | ICD-10-CM | POA: Diagnosis present

## 2017-12-16 DIAGNOSIS — E785 Hyperlipidemia, unspecified: Secondary | ICD-10-CM | POA: Diagnosis present

## 2017-12-16 DIAGNOSIS — I63532 Cerebral infarction due to unspecified occlusion or stenosis of left posterior cerebral artery: Secondary | ICD-10-CM | POA: Diagnosis not present

## 2017-12-16 DIAGNOSIS — R197 Diarrhea, unspecified: Secondary | ICD-10-CM | POA: Diagnosis not present

## 2017-12-16 DIAGNOSIS — R29702 NIHSS score 2: Secondary | ICD-10-CM | POA: Diagnosis present

## 2017-12-16 DIAGNOSIS — Z9049 Acquired absence of other specified parts of digestive tract: Secondary | ICD-10-CM | POA: Diagnosis not present

## 2017-12-16 DIAGNOSIS — Z8542 Personal history of malignant neoplasm of other parts of uterus: Secondary | ICD-10-CM | POA: Diagnosis not present

## 2017-12-16 DIAGNOSIS — I69320 Aphasia following cerebral infarction: Secondary | ICD-10-CM | POA: Diagnosis not present

## 2017-12-16 DIAGNOSIS — I10 Essential (primary) hypertension: Secondary | ICD-10-CM | POA: Diagnosis not present

## 2017-12-16 DIAGNOSIS — I639 Cerebral infarction, unspecified: Secondary | ICD-10-CM | POA: Diagnosis not present

## 2017-12-16 DIAGNOSIS — D62 Acute posthemorrhagic anemia: Secondary | ICD-10-CM | POA: Diagnosis present

## 2017-12-16 DIAGNOSIS — Z880 Allergy status to penicillin: Secondary | ICD-10-CM | POA: Diagnosis not present

## 2017-12-16 DIAGNOSIS — I7389 Other specified peripheral vascular diseases: Secondary | ICD-10-CM | POA: Diagnosis present

## 2017-12-16 DIAGNOSIS — I739 Peripheral vascular disease, unspecified: Secondary | ICD-10-CM | POA: Diagnosis not present

## 2017-12-16 DIAGNOSIS — I110 Hypertensive heart disease with heart failure: Secondary | ICD-10-CM | POA: Diagnosis present

## 2017-12-16 DIAGNOSIS — I69351 Hemiplegia and hemiparesis following cerebral infarction affecting right dominant side: Secondary | ICD-10-CM | POA: Diagnosis not present

## 2017-12-16 DIAGNOSIS — R112 Nausea with vomiting, unspecified: Secondary | ICD-10-CM | POA: Diagnosis not present

## 2017-12-16 LAB — ECHOCARDIOGRAM COMPLETE
HEIGHTINCHES: 64 in
Weight: 2160 oz

## 2017-12-16 LAB — LIPID PANEL
CHOLESTEROL: 176 mg/dL (ref 0–200)
HDL: 41 mg/dL (ref >40)
LDL Cholesterol: 124 mg/dL — ABNORMAL HIGH (ref 0–99)
Total CHOL/HDL Ratio: 4.3 RATIO
Triglycerides: 57 mg/dL (ref <150)
VLDL: 11 mg/dL (ref 0–40)

## 2017-12-16 LAB — URINALYSIS, ROUTINE W REFLEX MICROSCOPIC
Bilirubin Urine: NEGATIVE
Glucose, UA: NEGATIVE mg/dL
Ketones, ur: NEGATIVE mg/dL
Leukocytes, UA: NEGATIVE
Nitrite: NEGATIVE
PROTEIN: NEGATIVE mg/dL
SPECIFIC GRAVITY, URINE: 1.013 (ref 1.005–1.030)
pH: 7 (ref 5.0–8.0)

## 2017-12-16 LAB — HEMOGLOBIN A1C
Hgb A1c MFr Bld: 5.1 % (ref 4.8–5.6)
MEAN PLASMA GLUCOSE: 99.67 mg/dL

## 2017-12-16 MED ORDER — GADOBENATE DIMEGLUMINE 529 MG/ML IV SOLN
12.0000 mL | Freq: Once | INTRAVENOUS | Status: AC | PRN
  Administered 2017-12-16: 12 mL via INTRAVENOUS

## 2017-12-16 NOTE — Evaluation (Signed)
Physical Therapy Evaluation Patient Details Name: [name] MRN: [id number] DOB: 1946-11-16 Today's Date: 12/16/2017   History of Present Illness  72 year old female history of tobacco use, cancer presents with R side weakness, aphasia at 7pm. Her symptoms resolved completely shortly after arrival . She was admitted to the medicine team. 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 MRI head and neck did not show significant stenosis  Clinical Impression  Pt admitted with above diagnosis. Pt currently with functional limitations due to the deficits listed below (see PT Problem List). Pt is extremely lethargic throughout session and required daughters help to collect history and living situation. PTA pt completely independent, working 2 jobs and independent with ADLs and iADLs. Pt is currently minA for bed mobility, and transfers and modA for ambulation of 40 feet with RW. Current D/c recommendation of HHPT with 24 hr supervision contingent upon increase in function with increased arousal.  Pt will benefit from skilled PT acutely to increase their independence and safety with mobility.      Follow Up Recommendations Home health PT;Supervision/Assistance - 24 hour    Equipment Recommendations  Other (comment)(pt daughter reports RW and 3 in 1 at home but will check)    Recommendations for Other Services       Precautions / Restrictions Precautions Precautions: Fall Restrictions Weight Bearing Restrictions: No      Mobility  Bed Mobility Overal bed mobility: Needs Assistance Bed Mobility: Supine to Sit     Supine to sit: Min assist     General bed mobility comments: minA for management of R LE into bed  Transfers Overall transfer level: Needs assistance Equipment used: 1 person hand held assist;Rolling walker (2 wheeled) Transfers: Sit to/from [facility] Sit to Stand: Min assist Stand pivot transfers: Mod assist        General transfer comment: modA for stand pivot to/from [facility] for powerup and stedying, decreased ability to move R LE. minA for powerup to RW with sit>stand,.vc for hand placement for power up  Ambulation/Gait Ambulation/Gait assistance: Mod assist Ambulation Distance (Feet): 40 Feet Assistive device: Rolling walker (2 wheeled) Gait Pattern/deviations: Decreased step length - right;Decreased stance time - right;Decreased weight shift to left;Shuffle;Narrow base of support;Staggering right Gait velocity: slowed Gait velocity interpretation: Below normal speed for age/gender General Gait Details: modA for steadying with RW and correcting R lateral lean, verbal cues and minA for weightshift to L to advance R LE, no buckling of R knee with weightbearing, pt states she is aware she is leaning R but can not correct    Modified Rankin (Stroke Patients Only) Modified Rankin (Stroke Patients Only) Pre-Morbid Rankin Score: Moderately severe disability Modified Rankin: No symptoms     Balance Overall balance assessment: Needs assistance Sitting-balance support: Feet supported;Bilateral upper extremity supported Sitting balance-Leahy Scale: Poor Sitting balance - Comments: needs R UE support to correct R lean Postural control: Right lateral lean Standing balance support: Bilateral upper extremity supported Standing balance-Leahy Scale: Poor Standing balance comment: minA for maintaining balance                             Pertinent Vitals/Pain Pain Assessment: No/denies pain    Home Living Family/patient expects to be discharged to:: Private residence Living Arrangements: Other relatives(grandson and grandson's b/f) Available Help at Discharge: Available 24 hours/day;Family;Friend(s) Type of Home: House Home Access: Stairs to enter Entrance Stairs-Rails: Left Entrance Stairs-Number of  Steps: 2 Home Layout: Two level;Able to live on main level with bedroom/bathroom;Laundry  or work area in [place]: Environmental consultant - 2 wheels;Bedside commode(has 3 in 1) Additional Comments: Home living and prior function collected from daughters due to pt falling asleep during interview    Prior Function Level of Independence: Independent         Comments: works 2 jobs, 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 RLE Coordination: decreased gross motor;decreased fine motor    Cervical / Trunk Assessment Cervical / Trunk Assessment: Other exceptions Cervical / Trunk Exceptions: R lateral lean with decreased trunk control to correct   Communication   Communication: No difficulties  Cognition Arousal/Alertness: Lethargic Behavior During Therapy: Flat affect Overall Cognitive Status: Difficult to assess                                        General Comments General comments (skin integrity, edema, etc.): 2 daughters present during evaluation and assisted with interview        Assessment/Plan    PT Assessment Patient needs continued PT services  PT Problem List Decreased strength;Decreased range of motion;Decreased activity tolerance;Decreased balance;Decreased mobility;Decreased coordination;Decreased cognition;Decreased knowledge of use of DME;Decreased safety awareness       PT Treatment Interventions DME instruction;Gait training;Stair training;Functional mobility training;Therapeutic activities;Therapeutic exercise;Balance training;Neuromuscular re-education;Cognitive remediation;Patient/family education    PT Goals (Current goals can be found in the Care Plan section)  Acute Rehab PT Goals Patient Stated  Goal: none stated PT Goal Formulation: Patient unable to participate in goal setting Time For Goal Achievement: 12/30/17 Potential to Achieve Goals: Fair    Frequency Min 4X/week    AM-PAC PT "6 Clicks" Daily Activity  Outcome Measure Difficulty turning over in bed (including adjusting bedclothes, sheets and blankets)?: A Lot Difficulty moving from lying on back to sitting on the side of the bed? : Unable Difficulty sitting down on and standing up from a chair with arms (e.g., wheelchair, bedside commode, etc,.)?: Unable Help needed moving to and from a bed to chair (including a wheelchair)?: A Lot Help needed walking in hospital room?: A Lot Help needed climbing 3-5 steps with a railing? : Total 6 Click Score: 9    End of Session Equipment Utilized During Treatment: Gait belt Activity Tolerance: Patient limited by fatigue;Patient limited by lethargy Patient left: in bed;with call bell/phone within reach;with bed alarm set;with family/visitor present Nurse Communication: Mobility status PT Visit Diagnosis: Unsteadiness on feet (R26.81);Other abnormalities of gait and mobility (R26.89);Muscle weakness (generalized) (M62.81);Difficulty in walking, not elsewhere classified (R26.2);Other symptoms and signs involving the nervous system (R29.898);Hemiplegia and hemiparesis Hemiplegia - Right/Left: Right Hemiplegia - dominant/non-dominant: Dominant Hemiplegia - caused by: Cerebral infarction    Time: 0915-0950 PT Time Calculation (min) (ACUTE ONLY): 35 min   Charges:   PT Evaluation $PT Eval Moderate Complexity: 1 Mod PT Treatments $Therapeutic Activity: 8-22 mins   PT G Codes:        Emilo Gras B. Migdalia Dk PT, DPT Acute Rehabilitation  [phone] Pager [phone]    Bailey Mech  Fleet 12/16/2017, 12:39 PM

## 2017-12-16 NOTE — Progress Notes (Signed)
  Echocardiogram 2D Echocardiogram has been performed.  Jennette Dubin 12/16/2017, 3:53 PM

## 2017-12-16 NOTE — Evaluation (Signed)
SLP Cancellation Note  Patient Details Name: [name] MRN: [id number] DOB: Jan 25, 1946   Cancelled treatment:       Reason Eval/Treat Not Completed: Fatigue/lethargy limiting ability to participate   Macario Golds 12/16/2017, 9:50 AM   Luanna Salk, [facility] SLP [phone]

## 2017-12-16 NOTE — Progress Notes (Signed)
OT Cancellation Note  Patient Details Name: [name] MRN: [id number] DOB: [birth year]   Cancelled Treatment:    Reason Eval/Treat Not Completed: Fatigue/lethargy limiting ability to participate. Pt/family request OT return tomorrow  Britt Bottom 12/16/2017, 2:21 PM

## 2017-12-16 NOTE — Progress Notes (Signed)
PROGRESS NOTE    [name]  YIF:[id number] DOB: 10/10/1946 DOA: 12/15/2017 PCP: Briscoe Deutscher, MD   Outpatient Specialists:    Brief Narrative:  [name] is a 72 y.o. female with medical history significant for endometrial cancer status post hysterectomy, now presenting to the emergency department for evaluation of acute onset of right-sided weakness.  Patient reports that she had been experiencing some palpitations last night, had a nonspecific malaise this evening, and then while eating dinner with family, was noted to have difficulty using her utensils with her dominant right hand, had a right facial droop, and some speech difficulty.  She got up from the table and noted weakness involving the left arm and leg as well.  EMS was called and the patient was transported to the ED as a code stroke.     Assessment & Plan:   Principal Problem:   Acute CVA (cerebrovascular accident) [facility]) Active Problems:   Right sided weakness   Aphasia   Acute CVA - Presents with acute-onset of right-sided weakness and aphasia - Reports palpitations the night before: tele - Neurology is consulting and much appreciated - echocardiogram -MRA of neck done in ER -LDL: > 70-- statin -HgbA1c: 5.1 - Continue cardiac monitoring with frequent neuro checks, PT/OT/SLP evals, permissive HTN  - Start ASA 325  Current smoker  - Counseled toward cessation       DVT prophylaxis:  Lovenox   Code Status: Full Code   Family Communication: At bedside  Disposition Plan:     Consultants:   neuro     Subjective: Did not sleep last PM Would like to eat as well NPO despite passing swallow evaluation  Objective: Vitals:   12/15/17 2231 12/15/17 2300 12/16/17 0000 12/16/17 1048  BP: (!) 154/67 (!) 139/58 (!) 157/58 (!) 148/61  Pulse: 67 66 67 64  Resp: 20 (!) 21 18 18   Temp:   98.2 F (36.8 C) 98.2 F (36.8 C)  TempSrc:   Oral Oral  SpO2: 96% 97% 99% 96%  Weight:   61.4 kg (135  lb 5.8 oz)   Height:   5\' 4"  (1.626 m)     Intake/Output Summary (Last 24 hours) at 12/16/2017 1413 Last data filed at 12/16/2017 0600 Gross per 24 hour  Intake 581.67 ml  Output -  Net 581.67 ml   Filed Weights   12/15/17 2000 12/15/17 2040 12/16/17 0000  Weight: 62.4 kg (137 lb 9.1 oz) 62.4 kg (137 lb 9.1 oz) 61.4 kg (135 lb 5.8 oz)    Examination:  General exam: Appears calm and comfortable  Respiratory system: Clear to auscultation. Respiratory effort normal. Cardiovascular system: S1 & S2 heard, RRR. No JVD, murmurs, rubs, gallops or clicks. No pedal edema. Gastrointestinal system: Abdomen is nondistended, soft and nontender. No organomegaly or masses felt. Normal bowel sounds heard. Central nervous system: Alert and oriented Skin: No rashes, lesions or ulcers Psychiatry: Judgement and insight appear normal. Mood & affect appropriate.     Data Reviewed: I have personally reviewed following labs and imaging studies  CBC: Recent Labs  Lab 12/15/17 2029 12/15/17 2105  WBC 5.1  --   NEUTROABS 3.2  --   HGB 13.2 11.9*  HCT 38.7 35.0*  MCV 89.8  --   PLT 206  --    Basic Metabolic Panel: Recent Labs  Lab 12/15/17 2029 12/15/17 2105  NA 142 145  K 3.6 3.6  CL 108 103  CO2 23  --   GLUCOSE 111* 107*  BUN 23* 24*  CREATININE 0.84 0.90  CALCIUM 8.5*  --    GFR: Estimated Creatinine Clearance: 48.1 mL/min (by C-G formula based on SCr of 0.9 mg/dL). Liver Function Tests: Recent Labs  Lab 12/15/17 2029  AST 15  ALT 9*  ALKPHOS 104  BILITOT 0.4  PROT 6.0*  ALBUMIN 3.7   No results for input(s): LIPASE, AMYLASE in the last 168 hours. No results for input(s): AMMONIA in the last 168 hours. Coagulation Profile: Recent Labs  Lab 12/15/17 2029  INR 1.05   Cardiac Enzymes: No results for input(s): CKTOTAL, CKMB, CKMBINDEX, TROPONINI in the last 168 hours. BNP (last 3 results) No results for input(s): PROBNP in the last 8760 hours. HbA1C: Recent Labs     12/16/17 0446  HGBA1C 5.1   CBG: Recent Labs  Lab 12/15/17 2036  GLUCAP 102*   Lipid Profile: Recent Labs    12/16/17 0446  CHOL 176  HDL 41  LDLCALC 124*  TRIG 57  CHOLHDL 4.3   Thyroid Function Tests: No results for input(s): TSH, T4TOTAL, FREET4, T3FREE, THYROIDAB in the last 72 hours. Anemia Panel: No results for input(s): VITAMINB12, FOLATE, FERRITIN, TIBC, IRON, RETICCTPCT in the last 72 hours. Urine analysis: No results found for: COLORURINE, APPEARANCEUR, LABSPEC, [place], GLUCOSEU, HGBUR, BILIRUBINUR, KETONESUR, PROTEINUR, UROBILINOGEN, NITRITE, LEUKOCYTESUR   )No results found for this or any previous visit (from the past 240 hour(s)).    Anti-infectives (From admission, onward)   None       Radiology Studies: Mr Jodene Nam Neck W Wo Contrast  Result Date: 12/16/2017 CLINICAL DATA:  New onset aphasia and right-sided weakness EXAM: MR HEAD WITHOUT CONTRAST MR CIRCLE OF WILLIS WITHOUT CONTRAST MRA OF THE NECK WITHOUT AND WITH CONTRAST TECHNIQUE: Multiplanar, multiecho pulse sequences of the brain, circle of willis and surrounding structures were obtained without intravenous contrast. Angiographic images of the neck were obtained using MRA technique without and with intravenous contrast. CONTRAST:  84mL MULTIHANCE GADOBENATE DIMEGLUMINE 529 MG/ML IV SOLN COMPARISON:  Head CT 12/15/2017 FINDINGS: MRI HEAD FINDINGS Brain: The midline structures are normal. There is abnormal diffusion restriction in the left corpus striatum. No acute hemorrhage. There is severe periventricular, juxtacortical and deep white matter leukoaraiosis, most commonly seen in the setting of chronic ischemic microangiopathy. No mass lesion. No chronic microhemorrhage or cerebral amyloid angiopathy. No hydrocephalus, age advanced atrophy or lobar predominant volume loss. No dural abnormality or extra-axial collection. Skull and upper cervical spine: The visualized skull base, calvarium, upper cervical spine  and extracranial soft tissues are normal. Sinuses/Orbits: No fluid levels or advanced mucosal thickening. No mastoid effusion. Normal orbits. MRA HEAD FINDINGS Intracranial internal carotid arteries: Normal. Anterior cerebral arteries: Normal. Middle cerebral arteries: Normal. Posterior communicating arteries: Present on the right. Posterior cerebral arteries: Normal. Basilar artery: Normal. Vertebral arteries: Left dominant. Normal. Superior cerebellar arteries: Normal. Anterior inferior cerebellar arteries: Normal. Posterior inferior cerebellar arteries: Normal. MRA NECK FINDINGS Aortic arch: Normal 3 vessel aortic branching pattern. The visualized subclavian arteries are normal. Right carotid system: Normal course and caliber without stenosis or evidence of dissection. Left carotid system: Normal course and caliber without stenosis or evidence of dissection. Vertebral arteries: Left dominant. Vertebral artery origins are poorly visualized. Vertebral arteries are normal in course and caliber to the vertebrobasilar confluence without stenosis or evidence of dissection. IMPRESSION: 1. 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 2. Severe chronic ischemic microangiopathy. 3. No emergent large vessel occlusion or high-grade intracranial stenosis.  4. No flow limiting stenosis of the carotid or vertebral arteries. Electronically Signed   By: Ulyses Jarred M.D.   On: 12/16/2017 02:16   Mr Brain Wo Contrast  Result Date: 12/16/2017 CLINICAL DATA:  New onset aphasia and right-sided weakness EXAM: MR HEAD WITHOUT CONTRAST MR CIRCLE OF WILLIS WITHOUT CONTRAST MRA OF THE NECK WITHOUT AND WITH CONTRAST TECHNIQUE: Multiplanar, multiecho pulse sequences of the brain, circle of willis and surrounding structures were obtained without intravenous contrast. Angiographic images of the neck were obtained using MRA technique without and with intravenous contrast.  CONTRAST:  82mL MULTIHANCE GADOBENATE DIMEGLUMINE 529 MG/ML IV SOLN COMPARISON:  Head CT 12/15/2017 FINDINGS: MRI HEAD FINDINGS Brain: The midline structures are normal. There is abnormal diffusion restriction in the left corpus striatum. No acute hemorrhage. There is severe periventricular, juxtacortical and deep white matter leukoaraiosis, most commonly seen in the setting of chronic ischemic microangiopathy. No mass lesion. No chronic microhemorrhage or cerebral amyloid angiopathy. No hydrocephalus, age advanced atrophy or lobar predominant volume loss. No dural abnormality or extra-axial collection. Skull and upper cervical spine: The visualized skull base, calvarium, upper cervical spine and extracranial soft tissues are normal. Sinuses/Orbits: No fluid levels or advanced mucosal thickening. No mastoid effusion. Normal orbits. MRA HEAD FINDINGS Intracranial internal carotid arteries: Normal. Anterior cerebral arteries: Normal. Middle cerebral arteries: Normal. Posterior communicating arteries: Present on the right. Posterior cerebral arteries: Normal. Basilar artery: Normal. Vertebral arteries: Left dominant. Normal. Superior cerebellar arteries: Normal. Anterior inferior cerebellar arteries: Normal. Posterior inferior cerebellar arteries: Normal. MRA NECK FINDINGS Aortic arch: Normal 3 vessel aortic branching pattern. The visualized subclavian arteries are normal. Right carotid system: Normal course and caliber without stenosis or evidence of dissection. Left carotid system: Normal course and caliber without stenosis or evidence of dissection. Vertebral arteries: Left dominant. Vertebral artery origins are poorly visualized. Vertebral arteries are normal in course and caliber to the vertebrobasilar confluence without stenosis or evidence of dissection. IMPRESSION: 1. 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 2. Severe chronic ischemic  microangiopathy. 3. No emergent large vessel occlusion or high-grade intracranial stenosis. 4. No flow limiting stenosis of the carotid or vertebral arteries. Electronically Signed   By: Ulyses Jarred M.D.   On: 12/16/2017 02:16   Mr Jodene Nam Head Wo Contrast  Result Date: 12/16/2017 CLINICAL DATA:  New onset aphasia and right-sided weakness EXAM: MR HEAD WITHOUT CONTRAST MR CIRCLE OF WILLIS WITHOUT CONTRAST MRA OF THE NECK WITHOUT AND WITH CONTRAST TECHNIQUE: Multiplanar, multiecho pulse sequences of the brain, circle of willis and surrounding structures were obtained without intravenous contrast. Angiographic images of the neck were obtained using MRA technique without and with intravenous contrast. CONTRAST:  64mL MULTIHANCE GADOBENATE DIMEGLUMINE 529 MG/ML IV SOLN COMPARISON:  Head CT 12/15/2017 FINDINGS: MRI HEAD FINDINGS Brain: The midline structures are normal. There is abnormal diffusion restriction in the left corpus striatum. No acute hemorrhage. There is severe periventricular, juxtacortical and deep white matter leukoaraiosis, most commonly seen in the setting of chronic ischemic microangiopathy. No mass lesion. No chronic microhemorrhage or cerebral amyloid angiopathy. No hydrocephalus, age advanced atrophy or lobar predominant volume loss. No dural abnormality or extra-axial collection. Skull and upper cervical spine: The visualized skull base, calvarium, upper cervical spine and extracranial soft tissues are normal. Sinuses/Orbits: No fluid levels or advanced mucosal thickening. No mastoid effusion. Normal orbits. MRA HEAD FINDINGS Intracranial internal carotid arteries: Normal. Anterior cerebral arteries: Normal. Middle cerebral arteries: Normal. Posterior communicating arteries: Present  on the right. Posterior cerebral arteries: Normal. Basilar artery: Normal. Vertebral arteries: Left dominant. Normal. Superior cerebellar arteries: Normal. Anterior inferior cerebellar arteries: Normal. Posterior  inferior cerebellar arteries: Normal. MRA NECK FINDINGS Aortic arch: Normal 3 vessel aortic branching pattern. The visualized subclavian arteries are normal. Right carotid system: Normal course and caliber without stenosis or evidence of dissection. Left carotid system: Normal course and caliber without stenosis or evidence of dissection. Vertebral arteries: Left dominant. Vertebral artery origins are poorly visualized. Vertebral arteries are normal in course and caliber to the vertebrobasilar confluence without stenosis or evidence of dissection. IMPRESSION: 1. 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 2. Severe chronic ischemic microangiopathy. 3. No emergent large vessel occlusion or high-grade intracranial stenosis. 4. No flow limiting stenosis of the carotid or vertebral arteries. Electronically Signed   By: Ulyses Jarred M.D.   On: 12/16/2017 02:16   Ct Head Code Stroke Wo Contrast  Result Date: 12/15/2017 CLINICAL DATA:  Code stroke. Right-sided deficit, confusion, aphasia EXAM: CT HEAD WITHOUT CONTRAST TECHNIQUE: Contiguous axial images were obtained from the base of the skull through the vertex without intravenous contrast. COMPARISON:  None. FINDINGS: Brain: Mild to moderate atrophy. Moderate to advanced chronic appearing white matter changes diffusely. Negative for acute cortical infarct. Negative for hemorrhage or mass. No midline shift. Negative for hydrocephalus Vascular: Negative for hyperdense vessel Skull: Negative Sinuses/Orbits: Paranasal sinuses clear. Bilateral cataract removal. Other: None ASPECTS ([place] Stroke Program Early CT Score) - Ganglionic level infarction (caudate, lentiform nuclei, internal capsule, insula, M1-M3 cortex): 7 - Supraganglionic infarction (M4-M6 cortex): 3 Total score (0-10 with 10 being normal): 10 IMPRESSION: 1. No acute intracranial abnormality 2. ASPECTS is 10 3. Atrophy and moderate to advanced  chronic microvascular ischemia in the white matter. Electronically Signed   By: Franchot Gallo M.D.   On: 12/15/2017 20:55        Scheduled Meds: . aspirin  300 mg Rectal Daily   Or  . aspirin  325 mg Oral Daily  . atorvastatin  80 mg Oral q1800  . brinzolamide  1 drop Both Eyes BID  . enoxaparin (LOVENOX) injection  40 mg Subcutaneous Daily  . latanoprost  1 drop Both Eyes QHS  . multivitamin with minerals  1 tablet Oral Daily   Continuous Infusions:   LOS: 0 days    Time spent: 35 min    Geradine Girt, DO [facility] Pager [phone]  If 7PM-7AM, please contact night-coverage [email or web address] Password [facility] 12/16/2017, 2:13 PM

## 2017-12-16 NOTE — Progress Notes (Signed)
STROKE TEAM PROGRESS NOTE   HISTORY OF PRESENT ILLNESS (per record) [name] is an 72 y.o. female history of endometrial cancer, cholecystitis, smoker who presents with sudden onset aphasia and right-sided weakness.  Around 7 PM the patient was having dinner and suddenly noticed that she had facial droop and right arm and leg weakness. The patient also states she had difficulty getting words out and slurred speech. EMS was called and her symptoms have been resolving at the time she arrived. She was initially scored NIH 2  on arrival for mild drift and sensory symptoms however eventually her symptoms resolved completely by this and she complete her CT scan. CT head was negative for any hemorrhage.   Date last known well:  Time last known well:  7pm tPA Given: no, resolved symptoms  NIHSS: 0 Baseline MRS 0   SUBJECTIVE (INTERVAL HISTORY) The patient's daughter was at the bedside. The patient had a difficult time giving a history. This was supplied by her daughter. The patient is currently an every day smoker. Dr. Leonie Man encouraged her to quit. The patient did report that she is feeling better.  OBJECTIVE Temp:  [98.1 F (36.7 C)-98.2 F (36.8 C)] 98.2 F (36.8 C) (01/21 1048) Pulse Rate:  [64-81] 64 (01/21 1048) Cardiac Rhythm: Normal sinus rhythm (01/21 0800) Resp:  [12-23] 18 (01/21 1048) BP: (137-160)/(58-67) 148/61 (01/21 1048) SpO2:  [96 %-99 %] 96 % (01/21 1048) Weight:  [135 lb (61.2 kg)-137 lb 9.1 oz (62.4 kg)] 135 lb (61.2 kg) (01/21 0034)  CBC:  Recent Labs  Lab 12/15/17 2029 12/15/17 2105  WBC 5.1  --   NEUTROABS 3.2  --   HGB 13.2 11.9*  HCT 38.7 35.0*  MCV 89.8  --   PLT 206  --     Basic Metabolic Panel:  Recent Labs  Lab 12/15/17 2029 12/15/17 2105  NA 142 145  K 3.6 3.6  CL 108 103  CO2 23  --   GLUCOSE 111* 107*  BUN 23* 24*  CREATININE 0.84 0.90  CALCIUM 8.5*  --     Lipid Panel:     Component Value Date/Time   CHOL 176 12/16/2017 0446    TRIG 57 12/16/2017 0446   HDL 41 12/16/2017 0446   CHOLHDL 4.3 12/16/2017 0446   VLDL 11 12/16/2017 0446   LDLCALC 124 (H) 12/16/2017 0446   HgbA1c:  Lab Results  Component Value Date   HGBA1C 5.1 12/16/2017   Urine Drug Screen: No results found for: LABOPIA, COCAINSCRNUR, LABBENZ, AMPHETMU, THCU, LABBARB  Alcohol Level No results found for: [facility]   IMAGING  Mr [facility] Wo Contrast Mr Jodene Nam Neck W Wo Contrast 12/16/2017 IMPRESSION:  1. 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  2. Severe chronic ischemic microangiopathy.  3. No emergent large vessel occlusion or high-grade intracranial stenosis.  4. No flow limiting stenosis of the carotid or vertebral arteries.    Ct Head Code Stroke Wo Contrast 12/15/2017 IMPRESSION:  1. No acute intracranial abnormality  2. ASPECTS is 10  3. Atrophy and moderate to advanced chronic microvascular ischemia in the white matter.     Transthoracic Echocardiogram - pending 00/00/00      PHYSICAL EXAM Vitals:   12/15/17 2231 12/15/17 2300 12/16/17 0000 12/16/17 1048  BP: (!) 154/67 (!) 139/58 (!) 157/58 (!) 148/61  Pulse: 67 66 67 64  Resp: 20 (!) 21 18 18   Temp:   98.2 F (36.8  C) 98.2 F (36.8 C)  TempSrc:   Oral Oral  SpO2: 96% 97% 99% 96%  Weight:   135 lb 5.8 oz (61.4 kg)   Height:   5\' 4"  (1.626 m)    Pleasant elderly Caucasian lady currently not in distress. . Afebrile. Head is nontraumatic. Neck is supple without bruit.    Cardiac exam no murmur or gallop. Lungs are clear to auscultation. Distal pulses are well felt.  Neurological Exam ;  Awake  Alert oriented x 3. Normal speech and language.eye movements full without nystagmus.fundi were not visualized. Vision acuity and fields appear normal. Hearing is normal. Palatal movements are normal. Face symmetric. Tongue midline. Normal strength, tone, reflexes and coordination. Normal sensation. Gait  deferred.   HOME MEDICATIONS:  Medications Prior to Admission  Medication Sig Dispense Refill  . brinzolamide (AZOPT) 1 % ophthalmic suspension Place 1 drop into both eyes 2 (two) times daily.    . Cyanocobalamin (VITAMIN B-12 PO) Take 1 tablet by mouth daily.    Marland Kitchen ibuprofen (ADVIL,MOTRIN) 200 MG tablet Take 400-600 mg by mouth every 6 (six) hours as needed (pain).    . Latanoprostene Bunod (VYZULTA) 0.024 % SOLN Place 1 drop into both eyes at bedtime.    . Multiple Vitamin (MULTIVITAMIN WITH MINERALS) TABS tablet Take 1 tablet by mouth daily.        HOSPITAL MEDICATIONS:  . aspirin  300 mg Rectal Daily   Or  . aspirin  325 mg Oral Daily  . atorvastatin  80 mg Oral q1800  . brinzolamide  1 drop Both Eyes BID  . enoxaparin (LOVENOX) injection  40 mg Subcutaneous Daily  . latanoprost  1 drop Both Eyes QHS  . multivitamin with minerals  1 tablet Oral Daily     ASSESSMENT/PLAN [name] is a 72 y.o. female with history of endometrial cancer, cholecystitis, and tobacco use presenting with sudden onset of aphasia and right-sided weakness. She did not receive IV t-PA due to resolution of deficits.  Stroke: infarction of the left basal ganglia corpus striatum - secondary to small vessel disease  Resultant  No deficits  CT head -  No acute intracranial abnormality   MRI head - acute nonhemorrhagic infarction of the left basal ganglia corpus striatum  MRA head - no large vessel stenosis  Carotid Doppler - MRA neck  2D Echo - pending  LDL - 124  HgbA1c - 5.1  VTE prophylaxis - Lovenox Fall precautions Diet Heart Room service appropriate? Yes; Fluid consistency: Thin  No antithrombotic prior to admission, now on aspirin 325 mg daily  Patient counseled to be compliant with her antithrombotic medications  Ongoing aggressive stroke risk factor management  Therapy recommendations:  Home health PT recommended  Disposition:  Pending  Hypertension  Stable  Permissive  hypertension (OK if < 220/120) but gradually normalize in 5-7 days  Long-term BP goal normotensive  Hyperlipidemia  Home meds:  No lipid lowering medications prior to admission  LDL 124, goal < 70  Now on Lipitor 80 mg daily  Continue statin at discharge  Other Stroke Risk Factors  Advanced age  Cigarette smoker - advised to stop smoking   Other Active Problems  Mild anemia  Mildly elevated BUN   Plan / Recommendations   Await 2-D echo  Continue aspirin and Lipitor at discharge  Follow-up with Dr. Leonie Man in 6 weeks   Hospital day # 0  Mikey Bussing PA-C [facility] Pager [phone] 12/16/2017, 2:02 PM I  have personally examined this patient, reviewed notes, independently viewed imaging studies, participated in medical decision making and plan of care.ROS completed by me personally and pertinent positives fully documented  I have made any additions or clarifications directly to the above note. Agree with note above. She presented with right facial droop and right hemiparesis but appears to have improved MRI does show left brain subcortical lacunar infarct. She remains at risk for neurological worsening and recurrent strokes. Recommend aspirin and Lipitor. Finish ongoing stroke workup. Greater than 50% time during this 35 minute visit was spent on counseling and coordination of care about her lacunar infarct in discussion about stroke prevention and treatment and answering questions  Antony Contras, MD Medical Director [place] Pager: [phone] 12/16/2017 2:55 PM   To contact Stroke Continuity provider, please refer to [email or web address] After hours, contact General Neurology

## 2017-12-16 NOTE — Progress Notes (Signed)
NP on-called notified to look at pt's UA results. No new orders received. Reported off to oncoming RN. P.[employer] RN

## 2017-12-17 ENCOUNTER — Inpatient Hospital Stay (HOSPITAL_COMMUNITY)
Admission: RE | Admit: 2017-12-17 | Discharge: 2017-12-26 | DRG: 057 | Disposition: A | Discharge status: Home Health | Payer: Medicare Other | Source: Intra-hospital | Attending: Physical Medicine & Rehabilitation | Admitting: Physical Medicine & Rehabilitation

## 2017-12-17 ENCOUNTER — Encounter (HOSPITAL_COMMUNITY): Payer: Self-pay

## 2017-12-17 ENCOUNTER — Other Ambulatory Visit: Payer: Self-pay

## 2017-12-17 DIAGNOSIS — D62 Acute posthemorrhagic anemia: Secondary | ICD-10-CM

## 2017-12-17 DIAGNOSIS — F17200 Nicotine dependence, unspecified, uncomplicated: Secondary | ICD-10-CM | POA: Diagnosis present

## 2017-12-17 DIAGNOSIS — E876 Hypokalemia: Secondary | ICD-10-CM | POA: Diagnosis present

## 2017-12-17 DIAGNOSIS — I739 Peripheral vascular disease, unspecified: Secondary | ICD-10-CM | POA: Diagnosis present

## 2017-12-17 DIAGNOSIS — I69351 Hemiplegia and hemiparesis following cerebral infarction affecting right dominant side: Secondary | ICD-10-CM | POA: Diagnosis not present

## 2017-12-17 DIAGNOSIS — R269 Unspecified abnormalities of gait and mobility: Secondary | ICD-10-CM | POA: Diagnosis not present

## 2017-12-17 DIAGNOSIS — Z716 Tobacco abuse counseling: Secondary | ICD-10-CM

## 2017-12-17 DIAGNOSIS — Z79899 Other long term (current) drug therapy: Secondary | ICD-10-CM

## 2017-12-17 DIAGNOSIS — Z8542 Personal history of malignant neoplasm of other parts of uterus: Secondary | ICD-10-CM

## 2017-12-17 DIAGNOSIS — N390 Urinary tract infection, site not specified: Secondary | ICD-10-CM

## 2017-12-17 DIAGNOSIS — I69320 Aphasia following cerebral infarction: Secondary | ICD-10-CM

## 2017-12-17 DIAGNOSIS — I5032 Chronic diastolic (congestive) heart failure: Secondary | ICD-10-CM

## 2017-12-17 DIAGNOSIS — Z880 Allergy status to penicillin: Secondary | ICD-10-CM | POA: Diagnosis not present

## 2017-12-17 DIAGNOSIS — Z96642 Presence of left artificial hip joint: Secondary | ICD-10-CM | POA: Diagnosis present

## 2017-12-17 DIAGNOSIS — I639 Cerebral infarction, unspecified: Secondary | ICD-10-CM | POA: Diagnosis present

## 2017-12-17 DIAGNOSIS — G8191 Hemiplegia, unspecified affecting right dominant side: Secondary | ICD-10-CM | POA: Diagnosis not present

## 2017-12-17 DIAGNOSIS — E7800 Pure hypercholesterolemia, unspecified: Secondary | ICD-10-CM

## 2017-12-17 DIAGNOSIS — Z90710 Acquired absence of both cervix and uterus: Secondary | ICD-10-CM | POA: Diagnosis not present

## 2017-12-17 DIAGNOSIS — Z9049 Acquired absence of other specified parts of digestive tract: Secondary | ICD-10-CM

## 2017-12-17 DIAGNOSIS — E785 Hyperlipidemia, unspecified: Secondary | ICD-10-CM | POA: Diagnosis present

## 2017-12-17 DIAGNOSIS — R112 Nausea with vomiting, unspecified: Secondary | ICD-10-CM | POA: Diagnosis not present

## 2017-12-17 DIAGNOSIS — I10 Essential (primary) hypertension: Secondary | ICD-10-CM

## 2017-12-17 DIAGNOSIS — I69392 Facial weakness following cerebral infarction: Secondary | ICD-10-CM

## 2017-12-17 DIAGNOSIS — R197 Diarrhea, unspecified: Secondary | ICD-10-CM | POA: Diagnosis not present

## 2017-12-17 DIAGNOSIS — I69398 Other sequelae of cerebral infarction: Secondary | ICD-10-CM | POA: Diagnosis not present

## 2017-12-17 LAB — CBC
HEMATOCRIT: 37.2 % (ref 36.0–46.0)
HEMOGLOBIN: 12.6 g/dL (ref 12.0–15.0)
MCH: 30 pg (ref 26.0–34.0)
MCHC: 33.9 g/dL (ref 30.0–36.0)
MCV: 88.6 fL (ref 78.0–100.0)
Platelets: 218 10*3/uL (ref 150–400)
RBC: 4.2 MIL/uL (ref 3.87–5.11)
RDW: 12.5 % (ref 11.5–15.5)
WBC: 5.2 10*3/uL (ref 4.0–10.5)

## 2017-12-17 LAB — CREATININE, SERUM
Creatinine, Ser: 0.69 mg/dL (ref 0.44–1.00)
GFR calc non Af Amer: >60 mL/min (ref >60)

## 2017-12-17 MED ORDER — ENOXAPARIN SODIUM 40 MG/0.4ML ~~LOC~~ SOLN: 40.0000 mg | SUBCUTANEOUS | Status: DC

## 2017-12-17 MED ORDER — ATORVASTATIN CALCIUM 80 MG PO TABS
80.0000 mg | ORAL_TABLET | Freq: Every day | ORAL | Status: DC
  Administered 2017-12-18 – 2017-12-25 (×8): 80 mg via ORAL
  Filled 2017-12-17 (×8): qty 1

## 2017-12-17 MED ORDER — ACETAMINOPHEN 650 MG RE SUPP: 650.0000 mg | RECTAL | Status: DC | PRN

## 2017-12-17 MED ORDER — SORBITOL 70 % SOLN
30.0000 mL | Freq: Every day | Status: DC | PRN
  Administered 2017-12-25: 30 mL via ORAL
  Filled 2017-12-17: qty 30

## 2017-12-17 MED ORDER — ACETAMINOPHEN 325 MG PO TABS: 650.0000 mg | ORAL_TABLET | ORAL | Status: DC | PRN

## 2017-12-17 MED ORDER — SENNOSIDES-DOCUSATE SODIUM 8.6-50 MG PO TABS: 1.0000 | ORAL_TABLET | Freq: Every evening | ORAL | Status: AC | PRN

## 2017-12-17 MED ORDER — ENOXAPARIN SODIUM 40 MG/0.4ML ~~LOC~~ SOLN
40.0000 mg | Freq: Every day | SUBCUTANEOUS | Status: DC
  Administered 2017-12-18 – 2017-12-26 (×9): 40 mg via SUBCUTANEOUS
  Filled 2017-12-17 (×9): qty 0.4

## 2017-12-17 MED ORDER — LATANOPROST 0.005 % OP SOLN
1.0000 [drp] | Freq: Every day | OPHTHALMIC | Status: DC
  Administered 2017-12-17: 1 [drp] via OPHTHALMIC
  Filled 2017-12-17: qty 2.5

## 2017-12-17 MED ORDER — ASPIRIN 325 MG PO TABS: 325.0000 mg | ORAL_TABLET | Freq: Every day | ORAL | Status: AC

## 2017-12-17 MED ORDER — ACETAMINOPHEN 160 MG/5ML PO SOLN: 650.0000 mg | ORAL | Status: DC | PRN

## 2017-12-17 MED ORDER — ATORVASTATIN CALCIUM 80 MG PO TABS: 80.0000 mg | ORAL_TABLET | Freq: Every day | ORAL | Status: DC

## 2017-12-17 MED ORDER — ONDANSETRON HCL 4 MG/2ML IJ SOLN: 4.0000 mg | Freq: Four times a day (QID) | INTRAMUSCULAR | Status: DC | PRN

## 2017-12-17 MED ORDER — ADULT MULTIVITAMIN W/MINERALS CH
1.0000 | ORAL_TABLET | Freq: Every day | ORAL | Status: DC
  Administered 2017-12-18 – 2017-12-26 (×9): 1 via ORAL
  Filled 2017-12-17 (×9): qty 1

## 2017-12-17 MED ORDER — DEXTROSE 5 % IV SOLN
1.0000 g | INTRAVENOUS | Status: DC
  Administered 2017-12-17: 1 g via INTRAVENOUS
  Filled 2017-12-17: qty 10

## 2017-12-17 MED ORDER — BRINZOLAMIDE 1 % OP SUSP
1.0000 [drp] | Freq: Two times a day (BID) | OPHTHALMIC | Status: DC
  Administered 2017-12-17 – 2017-12-18 (×2): 1 [drp] via OPHTHALMIC
  Filled 2017-12-17: qty 10

## 2017-12-17 MED ORDER — ASPIRIN 300 MG RE SUPP: 300.0000 mg | Freq: Every day | RECTAL | Status: DC

## 2017-12-17 MED ORDER — ASPIRIN 325 MG PO TABS
325.0000 mg | ORAL_TABLET | Freq: Every day | ORAL | Status: DC
  Administered 2017-12-18 – 2017-12-26 (×9): 325 mg via ORAL
  Filled 2017-12-17 (×9): qty 1

## 2017-12-17 MED ORDER — SENNOSIDES-DOCUSATE SODIUM 8.6-50 MG PO TABS: 1.0000 | ORAL_TABLET | Freq: Every evening | ORAL | Status: DC | PRN

## 2017-12-17 MED ORDER — ONDANSETRON HCL 4 MG PO TABS
4.0000 mg | ORAL_TABLET | Freq: Four times a day (QID) | ORAL | Status: DC | PRN
  Administered 2017-12-21 (×2): 4 mg via ORAL
  Filled 2017-12-17 (×2): qty 1

## 2017-12-17 MED ORDER — CEFUROXIME AXETIL 500 MG PO TABS: 500.0000 mg | ORAL_TABLET | Freq: Two times a day (BID) | ORAL | 0 refills | Status: DC

## 2017-12-17 NOTE — Consult Note (Signed)
Physical Medicine and Rehabilitation Consult Reason for Consult: Right sided weakness and aphasia Referring Physician: [facility]   HPI: [name] is a 72 y.o. right handed female with history of endometrial cancer status post hysterectomy, tobacco abuse. Per chart review, patient, and daughter, patient lives with daughter and grandson. 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 Two-level home with bedroom on main and 2 steps to entry. Presented 12/15/2017 with right-sided weakness and aphasia as well as general malaise. Cranial CT reviewed, unremarkable for acute process. Patient did not receive TPA. 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 MRA with no emergent large vessel occlusion or stenosis. Echocardiogram with ejection fraction of 10% grade 1 diastolic dysfunction. Neurology consulted presently on aspirin for CVA prophylaxis. Subcutaneous Lovenox for DVT prophylaxis. Tolerating a regular diet. Presently on Rocephin empirically for suspect UTI. Physical therapy evaluation completed with recommendations of physical medicine rehabilitation consult.   Review of Systems  Constitutional: Negative for chills and fever.  HENT: Negative for hearing loss.   Eyes: Negative for blurred vision and double vision.  Respiratory: Negative for cough and shortness of breath.   Cardiovascular: Negative for chest pain, palpitations and leg swelling.  Gastrointestinal: Positive for constipation. Negative for nausea.  Genitourinary: Negative for flank pain and hematuria.  Musculoskeletal: Positive for myalgias.  Skin: Negative for rash.  Neurological: Positive for speech change and focal weakness. Negative for seizures.  All other systems reviewed and are negative.  Past Medical History:  Diagnosis Date  . Chronic cholecystitis   . Complication of anesthesia   . Diverticulosis   . Endometrial cancer  ([place]) 1977   s/p TAH  . History of transfusion 1970's  . PONV (postoperative nausea and vomiting)    Past Surgical History:  Procedure Laterality Date  . ABDOMINAL HYSTERECTOMY  1977   for endometrial cancer  . HIP ARTHROPLASTY Left 11/18/2016   Procedure: ARTHROPLASTY BIPOLAR HIP (HEMIARTHROPLASTY);  Surgeon: Nicholes Stairs, MD;  Location: [place];  Service: Orthopedics;  Laterality: Left;  . LAPAROSCOPIC CHOLECYSTECTOMY SINGLE SITE WITH INTRAOPERATIVE CHOLANGIOGRAM N/A 07/19/2015   Procedure: LAPAROSCOPIC CHOLECYSTECTOMY SINGLE SITE WITH INTRAOPERATIVE CHOLANGIOGRAM;  Surgeon: Michael Boston, MD;  Location: [facility];  Service: General;  Laterality: N/A;  . TONSILLECTOMY    . TUBAL LIGATION     Family History  Problem Relation Age of Onset  . Obesity Daughter    Social History:  reports that she has been smoking.  she has never used smokeless tobacco. She reports that she does not drink alcohol or use drugs. Allergies:  Allergies  Allergen Reactions  . Penicillins Nausea And Vomiting    Has patient had a PCN reaction causing immediate rash, facial/tongue/throat swelling, SOB or lightheadedness with hypotension: no Has patient had a PCN reaction causing severe rash involving mucus membranes or skin necrosis:no Has patient had a PCN reaction that required hospitalization no Has patient had a PCN reaction occurring within the last 10 years: no If all of the above answers are "NO", then may proceed with Cephalosporin use.   Medications Prior to Admission  Medication Sig Dispense Refill  . brinzolamide (AZOPT) 1 % ophthalmic suspension Place 1 drop into both eyes 2 (two) times daily.    . Cyanocobalamin (VITAMIN B-12 PO) Take 1 tablet by mouth daily.    Marland Kitchen ibuprofen (ADVIL,MOTRIN) 200 MG tablet Take 400-600 mg by mouth every 6 (six) hours as needed (pain).    Marland Kitchen  Latanoprostene Bunod (VYZULTA) 0.024 % SOLN Place 1 drop into both eyes at bedtime.    . Multiple Vitamin (MULTIVITAMIN WITH  MINERALS) TABS tablet Take 1 tablet by mouth daily.      Home: Home Living Family/patient expects to be discharged to:: Private residence Living Arrangements: Other relatives Available Help at Discharge: Family, Available 24 hours/day Type of Home: House Home Access: Stairs to enter [employer] of Steps: 2 Entrance Stairs-Rails: Left Home Layout: Two level Alternate Level Stairs-Number of Steps: 13 Alternate Level Stairs-Rails: Left Bathroom Shower/Tub: Multimedia programmer: Standard Bathroom Accessibility: Yes Home Equipment: Environmental consultant - 2 wheels, Bedside commode Additional Comments: Home living and prior function collected from daughters due to pt falling asleep during interview  Functional History: Prior Function Level of Independence: Independent Comments: works 2 jobs, 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 Transfers Sit to Stand: [place] pivot transfers: Mod assist General transfer comment: modA for stand pivot to/from [facility] for powerup and stedying, decreased ability to move R LE. minA for powerup to RW with sit>stand,.vc for hand placement for power up Ambulation/Gait Ambulation/Gait assistance: Mod assist Ambulation Distance (Feet): 40 Feet Assistive device: Rolling walker (2 wheeled) Gait Pattern/deviations: Decreased step length - right, Decreased stance time - right, Decreased weight shift to left, Shuffle, Narrow base of support, Staggering right General Gait Details: modA for steadying with RW and correcting R lateral lean, verbal cues and minA for weightshift to L to advance R  LE, no buckling of R knee with weightbearing, pt states she is aware she is leaning R but can not correct Gait velocity: slowed Gait velocity interpretation: Below normal speed for age/gender    ADL:    Cognition: Cognition Overall Cognitive Status: Difficult to assess Orientation Level: Oriented X4 Cognition Arousal/Alertness: Lethargic Behavior During Therapy: Flat affect Overall Cognitive Status: Difficult to assess Difficult to assess due to: Level of arousal  Blood pressure (!) 149/47, pulse 60, temperature 97.7 F (36.5 C), temperature source Oral, resp. rate 18, height 5\' 4"  (1.626 m), weight 61.4 kg (135 lb 5.8 oz), SpO2 96 %. Physical Exam  Vitals reviewed. Constitutional: She is oriented to person, place, and time. She appears well-developed and well-nourished.  HENT:  Head: Normocephalic and atraumatic.  Eyes: EOM are normal. Right eye exhibits no discharge. Left eye exhibits no discharge.  Neck: Normal range of motion. Neck supple. No thyromegaly present.  Cardiovascular: Normal rate, regular rhythm and normal heart sounds.  Respiratory: Effort normal and breath sounds normal. No respiratory distress.  GI: Soft. Bowel sounds are normal. She exhibits no distension.  Musculoskeletal:  No edema or tenderness in extremities  Neurological: She is alert and oriented to person, place, and time.  Makes good eye contact with examiner.  Follows simple commands.  She does have some mild aphasia Right facial droop Motor: LUE/LLE: 5/5 proximal to distal LUE: 4/5 proximal to distal with ataxia corrected by very slow movements LLE: 4/5 proximal to distal   Skin: Skin is warm and dry.  Psychiatric: She has a normal mood and affect. Her behavior is normal.    Results for orders placed or performed during the hospital encounter of 12/15/17 (  from the past 24 hour(s))  Urinalysis, Routine w reflex microscopic     Status: Abnormal   Collection Time: 12/16/17  6:31 PM  Result  Value Ref Range   Color, Urine YELLOW YELLOW   APPearance HAZY (A) CLEAR   Specific Gravity, Urine 1.013 1.005 - 1.030   pH 7.0 5.0 - 8.0   Glucose, UA NEGATIVE NEGATIVE mg/dL   Hgb urine dipstick SMALL (A) NEGATIVE   Bilirubin Urine NEGATIVE NEGATIVE   Ketones, ur NEGATIVE NEGATIVE mg/dL   Protein, ur NEGATIVE NEGATIVE mg/dL   Nitrite NEGATIVE NEGATIVE   Leukocytes, UA NEGATIVE NEGATIVE   RBC / HPF 0-5 0 - 5 RBC/hpf   WBC, UA 6-30 0 - 5 WBC/hpf   Bacteria, UA MANY (A) NONE SEEN   Squamous Epithelial / LPF 0-5 (A) NONE SEEN   Mucus PRESENT    Mr Jodene Nam Neck W Wo Contrast  Result Date: 12/16/2017 CLINICAL DATA:  New onset aphasia and right-sided weakness EXAM: MR HEAD WITHOUT CONTRAST MR CIRCLE OF WILLIS WITHOUT CONTRAST MRA OF THE NECK WITHOUT AND WITH CONTRAST TECHNIQUE: Multiplanar, multiecho pulse sequences of the brain, circle of willis and surrounding structures were obtained without intravenous contrast. Angiographic images of the neck were obtained using MRA technique without and with intravenous contrast. CONTRAST:  2mL MULTIHANCE GADOBENATE DIMEGLUMINE 529 MG/ML IV SOLN COMPARISON:  Head CT 12/15/2017 FINDINGS: MRI HEAD FINDINGS Brain: The midline structures are normal. There is abnormal diffusion restriction in the left corpus striatum. No acute hemorrhage. There is severe periventricular, juxtacortical and deep white matter leukoaraiosis, most commonly seen in the setting of chronic ischemic microangiopathy. No mass lesion. No chronic microhemorrhage or cerebral amyloid angiopathy. No hydrocephalus, age advanced atrophy or lobar predominant volume loss. No dural abnormality or extra-axial collection. Skull and upper cervical spine: The visualized skull base, calvarium, upper cervical spine and extracranial soft tissues are normal. Sinuses/Orbits: No fluid levels or advanced mucosal thickening. No mastoid effusion. Normal orbits. MRA HEAD FINDINGS Intracranial internal carotid arteries:  Normal. Anterior cerebral arteries: Normal. Middle cerebral arteries: Normal. Posterior communicating arteries: Present on the right. Posterior cerebral arteries: Normal. Basilar artery: Normal. Vertebral arteries: Left dominant. Normal. Superior cerebellar arteries: Normal. Anterior inferior cerebellar arteries: Normal. Posterior inferior cerebellar arteries: Normal. MRA NECK FINDINGS Aortic arch: Normal 3 vessel aortic branching pattern. The visualized subclavian arteries are normal. Right carotid system: Normal course and caliber without stenosis or evidence of dissection. Left carotid system: Normal course and caliber without stenosis or evidence of dissection. Vertebral arteries: Left dominant. Vertebral artery origins are poorly visualized. Vertebral arteries are normal in course and caliber to the vertebrobasilar confluence without stenosis or evidence of dissection. IMPRESSION: 1. 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 2. Severe chronic ischemic microangiopathy. 3. No emergent large vessel occlusion or high-grade intracranial stenosis. 4. No flow limiting stenosis of the carotid or vertebral arteries. Electronically Signed   By: Ulyses Jarred M.D.   On: 12/16/2017 02:16   Mr Brain Wo Contrast  Result Date: 12/16/2017 CLINICAL DATA:  New onset aphasia and right-sided weakness EXAM: MR HEAD WITHOUT CONTRAST MR CIRCLE OF WILLIS WITHOUT CONTRAST MRA OF THE NECK WITHOUT AND WITH CONTRAST TECHNIQUE: Multiplanar, multiecho pulse sequences of the brain, circle of willis and surrounding structures were obtained without intravenous contrast. Angiographic images of the neck were obtained using MRA technique without and with intravenous contrast. CONTRAST:  78mL MULTIHANCE GADOBENATE DIMEGLUMINE 529 MG/ML IV SOLN COMPARISON:  Head CT  12/15/2017 FINDINGS: MRI HEAD FINDINGS Brain: The midline structures are normal. There is abnormal diffusion  restriction in the left corpus striatum. No acute hemorrhage. There is severe periventricular, juxtacortical and deep white matter leukoaraiosis, most commonly seen in the setting of chronic ischemic microangiopathy. No mass lesion. No chronic microhemorrhage or cerebral amyloid angiopathy. No hydrocephalus, age advanced atrophy or lobar predominant volume loss. No dural abnormality or extra-axial collection. Skull and upper cervical spine: The visualized skull base, calvarium, upper cervical spine and extracranial soft tissues are normal. Sinuses/Orbits: No fluid levels or advanced mucosal thickening. No mastoid effusion. Normal orbits. MRA HEAD FINDINGS Intracranial internal carotid arteries: Normal. Anterior cerebral arteries: Normal. Middle cerebral arteries: Normal. Posterior communicating arteries: Present on the right. Posterior cerebral arteries: Normal. Basilar artery: Normal. Vertebral arteries: Left dominant. Normal. Superior cerebellar arteries: Normal. Anterior inferior cerebellar arteries: Normal. Posterior inferior cerebellar arteries: Normal. MRA NECK FINDINGS Aortic arch: Normal 3 vessel aortic branching pattern. The visualized subclavian arteries are normal. Right carotid system: Normal course and caliber without stenosis or evidence of dissection. Left carotid system: Normal course and caliber without stenosis or evidence of dissection. Vertebral arteries: Left dominant. Vertebral artery origins are poorly visualized. Vertebral arteries are normal in course and caliber to the vertebrobasilar confluence without stenosis or evidence of dissection. IMPRESSION: 1. 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 2. Severe chronic ischemic microangiopathy. 3. No emergent large vessel occlusion or high-grade intracranial stenosis. 4. No flow limiting stenosis of the carotid or vertebral arteries. Electronically Signed   By: Ulyses Jarred M.D.    On: 12/16/2017 02:16   Mr Jodene Nam Head Wo Contrast  Result Date: 12/16/2017 CLINICAL DATA:  New onset aphasia and right-sided weakness EXAM: MR HEAD WITHOUT CONTRAST MR CIRCLE OF WILLIS WITHOUT CONTRAST MRA OF THE NECK WITHOUT AND WITH CONTRAST TECHNIQUE: Multiplanar, multiecho pulse sequences of the brain, circle of willis and surrounding structures were obtained without intravenous contrast. Angiographic images of the neck were obtained using MRA technique without and with intravenous contrast. CONTRAST:  54mL MULTIHANCE GADOBENATE DIMEGLUMINE 529 MG/ML IV SOLN COMPARISON:  Head CT 12/15/2017 FINDINGS: MRI HEAD FINDINGS Brain: The midline structures are normal. There is abnormal diffusion restriction in the left corpus striatum. No acute hemorrhage. There is severe periventricular, juxtacortical and deep white matter leukoaraiosis, most commonly seen in the setting of chronic ischemic microangiopathy. No mass lesion. No chronic microhemorrhage or cerebral amyloid angiopathy. No hydrocephalus, age advanced atrophy or lobar predominant volume loss. No dural abnormality or extra-axial collection. Skull and upper cervical spine: The visualized skull base, calvarium, upper cervical spine and extracranial soft tissues are normal. Sinuses/Orbits: No fluid levels or advanced mucosal thickening. No mastoid effusion. Normal orbits. MRA HEAD FINDINGS Intracranial internal carotid arteries: Normal. Anterior cerebral arteries: Normal. Middle cerebral arteries: Normal. Posterior communicating arteries: Present on the right. Posterior cerebral arteries: Normal. Basilar artery: Normal. Vertebral arteries: Left dominant. Normal. Superior cerebellar arteries: Normal. Anterior inferior cerebellar arteries: Normal. Posterior inferior cerebellar arteries: Normal. MRA NECK FINDINGS Aortic arch: Normal 3 vessel aortic branching pattern. The visualized subclavian arteries are normal. Right carotid system: Normal course and caliber  without stenosis or evidence of dissection. Left carotid system: Normal course and caliber without stenosis or evidence of dissection. Vertebral arteries: Left dominant. Vertebral artery origins are poorly visualized. Vertebral arteries are normal in course and caliber to the vertebrobasilar confluence without stenosis or evidence of dissection. IMPRESSION: 1. Acute nonhemorrhagic infarction of the left basal ganglia  corpus striatum without associated mass effect. 2. Severe chronic ischemic microangiopathy. 3. No emergent large vessel occlusion or high-grade intracranial stenosis. 4. No flow limiting stenosis of the carotid or vertebral arteries. Electronically Signed   By: Ulyses Jarred M.D.   On: 12/16/2017 02:16   Ct Head Code Stroke Wo Contrast  Result Date: 12/15/2017 CLINICAL DATA:  Code stroke. Right-sided deficit, confusion, aphasia EXAM: CT HEAD WITHOUT CONTRAST TECHNIQUE: Contiguous axial images were obtained from the base of the skull through the vertex without intravenous contrast. COMPARISON:  None. FINDINGS: Brain: Mild to moderate atrophy. Moderate to advanced chronic appearing white matter changes diffusely. Negative for acute cortical infarct. Negative for hemorrhage or mass. No midline shift. Negative for hydrocephalus Vascular: Negative for hyperdense vessel Skull: Negative Sinuses/Orbits: Paranasal sinuses clear. Bilateral cataract removal. Other: None ASPECTS ([place] Stroke Program Early CT Score) - Ganglionic level infarction (caudate, lentiform nuclei, internal capsule, insula, M1-M3 cortex): 7 - Supraganglionic infarction (M4-M6 cortex): 3 Total score (0-10 with 10 being normal): 10 IMPRESSION: 1. No acute intracranial abnormality 2. ASPECTS is 10 3. Atrophy and moderate to advanced chronic microvascular ischemia in the white matter. Electronically Signed   By: Franchot Gallo M.D.   On: 12/15/2017 20:55    Assessment/Plan: Diagnosis:  left basal ganglia corpus striatum infarct Labs  and images independently reviewed.  Records reviewed and summated above. Stroke: Continue secondary stroke prophylaxis and Risk Factor Modification listed below:   Antiplatelet therapy:   Blood Pressure Management:  Continue current medication with prn's with permisive HTN per primary team Statin Agent:   Tobacco abuse:   Right sided hemiparesis:  Motor recovery: Fluoxetine   1. Does the need for close, 24 hr/day medical supervision in concert with the patient's rehab needs make it unreasonable for this patient to be served in a less intensive setting? Yes  2. Co-Morbidities requiring supervision/potential complications: endometrial cancer status post hysterectomy, tobacco abuse (counsel), diastolic CHF (monitor for signs and symptoms of fluid overload), UTI (cont abx), HTN (monitor and provide prns in accordance with increased physical exertion and pain), ABLA (transfuse if necessary to ensure appropriate perfusion for increased activity tolerance), HLD (cont meds) 3. Due to safety, disease management and patient education, does the patient require 24 hr/day rehab nursing? Yes 4. Does the patient require coordinated care of a physician, rehab nurse, PT (1-2 hrs/day, 5 days/week), OT (1-2 hrs/day, 5 days/week) and SLP (1-2 hrs/day, 5 days/week) to address physical and functional deficits in the context of the above medical diagnosis(es)? Yes Addressing deficits in the following areas: balance, endurance, locomotion, strength, transferring, bathing, dressing, toileting, speech and psychosocial support 5. Can the patient actively participate in an intensive therapy program of at least 3 hrs of therapy per day at least 5 days per week? Yes 6. The potential for patient to make measurable gains while on inpatient rehab is excellent 7. Anticipated functional outcomes upon discharge from inpatient rehab are supervision and min assist  with PT, supervision and min assist with OT, modified independent with  SLP. 8. Estimated rehab length of stay to reach the above functional goals is: 13-17 days. 9. Anticipated D/C setting: Home 10. Anticipated post D/C treatments: HH therapy and Home excercise program 11. Overall Rehab/Functional Prognosis: good  RECOMMENDATIONS: This patient's condition is appropriate for continued rehabilitative care in the following setting: Recommend CIR, however, pt is hesitant to stay in hospital. She would like to discuss with her family.  If patient decides she is going to go home, recommend  HH with PM&R outpt follow up. Patient has agreed to participate in recommended program. Potentially Note that insurance prior authorization may be required for reimbursement for recommended care.  Comment: Rehab Admissions Coordinator to follow up.  Delice Lesch, MD, ABPMR Lavon Paganini Angiulli, PA-C 12/17/2017

## 2017-12-17 NOTE — Care Management Note (Signed)
Case Management Note  Patient Details  Name: [name] MRN: [id number] Date of Birth: 1946-04-01  Subjective/Objective:   Patient for dc to CIR today per CIR coordinator note.                 Action/Plan: DC to CIR.  Expected Discharge Date:  12/17/17               Expected Discharge Plan:  [place]  In-House Referral:     Discharge planning Services  CM Consult  Post Acute Care Choice:    Choice offered to:     DME Arranged:    DME Agency:     HH Arranged:    HH Agency:     Status of Service:  Completed, signed off  If discussed at [employer], dates discussed:    Additional Comments:  Zenon Mayo, RN 12/17/2017, 2:27 PM

## 2017-12-17 NOTE — Evaluation (Signed)
Occupational Therapy Evaluation Patient Details Name: [name] MRN: [id number] DOB: [birth year] Today's Date: 12/17/2017    History of Present Illness 72 year old female history of tobacco use, cancer presents with R side weakness, aphasia at 7pm. Her symptoms resolved completely shortly after arrival . She was admitted to the medicine team. 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 MRI head and neck did not show significant stenosis   Clinical Impression   PTA, was living with her nephew and was independent and working two jobs. Pt currently requiring Max A for standing balance at sink during grooming, Max A for toileting, and Mod A for functional mobility with RW. Pt demonstrating poor functional use of right (dominant) hand, decreased balance with significant right lateral lean, poor cognition, and decreased attention/awarness of R side of body and environment. Pt motivated to participate in therapy and to return to PLOF and has good family support. Pt will require acute OT to facilitate safe dc. Recommend dc to CIR for intensive OT to optimize safety and independence with ADLs and functional mobility as well as decreased caregiver burden.    Follow Up Recommendations  CIR;Supervision/Assistance - 24 hour    Equipment Recommendations  Other (comment)(Defer to next venue)    Recommendations for Other Services PT consult;Rehab consult;Speech consult     Precautions / Restrictions Precautions Precautions: Fall Restrictions Weight Bearing Restrictions: No      Mobility Bed Mobility Overal bed mobility: Needs Assistance Bed Mobility: Supine to Sit     Supine to sit: Min assist     General bed mobility comments: minA for management of R LE into bed  Transfers Overall transfer level: Needs assistance Equipment used: 1 person hand held assist;Rolling walker (2 wheeled) Transfers: Sit to/from [facility] Sit to Stand:  Min assist Stand pivot transfers: Mod assist       General transfer comment: modA for stand pivot to/from [facility] for powerup and stedying, decreased ability to move R LE. minA for powerup to RW with sit>stand,.vc for hand placement for power up    Balance Overall balance assessment: Needs assistance Sitting-balance support: Feet supported;Bilateral upper extremity supported Sitting balance-Leahy Scale: Poor Sitting balance - Comments: needs R UE support to correct R lean Postural control: Right lateral lean Standing balance support: Bilateral upper extremity supported Standing balance-Leahy Scale: Poor Standing balance comment: minA for maintaining balance                           ADL either performed or assessed with clinical judgement   ADL Overall ADL's : Needs assistance/impaired Eating/Feeding: Minimal assistance;Sitting   Grooming: Maximal assistance;Standing;Oral care;Wash/dry hands;Wash/dry face Grooming Details (indicate cue type and reason): Max A for standing balance with significant R lateral lean. Pt requiring Max cues to recognize her was leaning and then required Max A for correct balance. Pt requiring cues during grooming to initiate and sequence tasks. Pt demosntrating poor attention, processing, problem solving, and awarness. Upper Body Bathing: Moderate assistance;Sitting   Lower Body Bathing: Maximal assistance;Sit to/from stand   Upper Body Dressing : Moderate assistance;Sitting   Lower Body Dressing: Maximal assistance;Sit to/from stand   Toilet Transfer: Moderate assistance;Ambulation;RW;Regular Toilet;Maximal assistance Toilet Transfer Details (indicate cue type and reason): Mod A to power up into standing. Max A for R lateral lean in standing.  Toileting- Clothing Manipulation and Hygiene: Maximal assistance;Sit to/from stand;Cueing for sequencing;Cueing for safety Toileting - Clothing Manipulation Details (indicate  cue type and reason): Pt  requiring Max A for standing balance during toielt hygiene and pulling up underwear due to signficant R lateral lean.     Functional mobility during ADLs: Moderate assistance;Rolling walker(Bumping into wall and object on R side) General ADL Comments: Pt dmeonstrating decreased funcitonal perform and poor awarness of deficits and safety. Pt presenting with poor grasp strength, FM skills, and funcitonal use of RUE (dominant hand). Pt with decreased cognition dmeonstrating poor attention, problem solving, and awarness. Pt requiring Max cues throughout session for performance of ADLs.     Vision Baseline Vision/History: Wears glasses(Has had cataract and glaucoma surgery) Wears Glasses: Reading only Patient Visual Report: No change from baseline;Other (comment)(Pt presenting with poor awarness of deficits) Vision Assessment?: Yes;Vision impaired- to be further tested in functional context Eye Alignment: Within Functional Limits Tracking/Visual Pursuits: Requires cues, head turns, or add eye shifts to track;Impaired - to be further tested in functional context;Unable to hold eye position out of midline;Other (comment)(Max cues and still unable to complete tracking) Visual Fields: Right visual field deficit;Impaired-to be further tested in functional context Depth Perception: Undershoots Additional Comments: Pt with visual deficits dmeonstrating poor tracking and undershooting during finger-to-nose test. Pt not locating pen in right visual field when brining pen from back to front. Pt locating pen once it enters ~20 degrees from midline.      Perception     Praxis      Pertinent Vitals/Pain Pain Assessment: No/denies pain     Hand Dominance Right   Extremity/Trunk Assessment Upper Extremity Assessment Upper Extremity Assessment: RUE deficits/detail RUE Deficits / Details: Poor grasp strength and FM skills as seen during funcitonal tasks and testing (i.e. pt unabel to maintian grasp during  grooming and dropped items). Pt able to perform finger oppositiong with significant amount of time and effort. Pt demonstrating undershooting for target reaching.  RUE Sensation: decreased light touch RUE Coordination: decreased fine motor;decreased gross motor   Lower Extremity Assessment Lower Extremity Assessment: Defer to PT evaluation RLE Deficits / Details: AROM WFL, strength grossly assessed at 3/5 at hip, and 4/5 in knee and ankle. RLE Coordination: decreased gross motor;decreased fine motor   Cervical / Trunk Assessment Cervical / Trunk Assessment: Other exceptions Cervical / Trunk Exceptions: R lateral lean with decreased trunk control to correct    Communication Communication Communication: No difficulties   Cognition Arousal/Alertness: Awake/alert Behavior During Therapy: Flat affect Overall Cognitive Status: Impaired/Different from baseline Area of Impairment: Attention;Memory;Following commands;Safety/judgement;Problem solving;Awareness                   Current Attention Level: Sustained Memory: Decreased short-term memory Following Commands: Follows one step commands inconsistently;Follows one step commands with increased time Safety/Judgement: Decreased awareness of safety;Decreased awareness of deficits Awareness: Emergent Problem Solving: Slow processing;Decreased initiation;Difficulty sequencing;Requires verbal cues;Requires tactile cues General Comments: Pt demonstratign poor cognition. Pt unable to maintain attention and required quite environment to perform ADLs. Poor initation of tasks and required verbal and visual cues. Decreased problem sovling during ADL tasks. Required increased time and cues throughout session.   General Comments  Daughters present during 42 of session and then leaving.    Exercises     Shoulder Instructions      Home Living Family/patient expects to be discharged to:: Private residence Living Arrangements: Other  relatives Available Help at Discharge: Family;Available 24 hours/day Type of Home: House Home Access: Stairs to enter [employer] of Steps: 2 Entrance Stairs-Rails: Left Home Layout: Two level Alternate Level  Stairs-Number of Steps: 13 Alternate Level Stairs-Rails: Left Bathroom Shower/Tub: Occupational psychologist: Standard Bathroom Accessibility: Yes   Home Equipment: Environmental consultant - 2 wheels;Bedside commode   Additional Comments: Home living and prior function collected from daughters      Prior Functioning/Environment Level of Independence: Independent        Comments: independent in ADLs, IADLs, works 2 jobs Librarian, academic), drives         OT Problem List: Decreased strength;Decreased activity tolerance;Decreased range of motion;Impaired balance (sitting and/or standing);Impaired vision/perception;Decreased coordination;Decreased cognition;Decreased safety awareness;Decreased knowledge of use of DME or AE;Decreased knowledge of precautions;Impaired UE functional use      OT Treatment/Interventions: Self-care/ADL training;Therapeutic exercise;Energy conservation;DME and/or AE instruction;Therapeutic activities;Patient/family education    OT Goals(Current goals can be found in the care plan section) Acute Rehab OT Goals Patient Stated Goal: Get better OT Goal Formulation: With patient Time For Goal Achievement: 12/31/17 Potential to Achieve Goals: Good ADL Goals Pt Will Perform Grooming: with min guard assist;standing Pt Will Perform Upper Body Dressing: sitting;with set-up;with supervision Pt Will Perform Lower Body Dressing: with min guard assist;sit to/from stand Pt Will Transfer to Toilet: with min guard assist;bedside commode;ambulating Pt Will Perform Toileting - Clothing Manipulation and hygiene: with min guard assist;sit to/from stand Additional ADL Goal #1: Pt will demonstrate selective attention during ADL task in distracting environment with Min  verbal cues. Additional ADL Goal #2: Pt will use RUE 75% of time during ADL task with Min VCs  OT Frequency: Min 3X/week   Barriers to D/C:            Co-evaluation              AM-PAC PT "6 Clicks" Daily Activity     Outcome Measure Help from another person eating meals?: A Little Help from another person taking care of personal grooming?: A Little Help from another person toileting, which includes using toliet, bedpan, or urinal?: A Lot Help from another person bathing (including washing, rinsing, drying)?: A Lot Help from another person to put on and taking off regular upper body clothing?: A Lot Help from another person to put on and taking off regular lower body clothing?: A Lot 6 Click Score: 14   End of Session Equipment Utilized During Treatment: Gait belt;Rolling walker Nurse Communication: Mobility status;Other (comment)(Chair alarm pad placed, no box)  Activity Tolerance: Patient tolerated treatment well Patient left: in chair;with call bell/phone within reach;with chair alarm set;Other (comment)(No chair alarm box in room; RN notified)  OT Visit Diagnosis: Unsteadiness on feet (R26.81);Other abnormalities of gait and mobility (R26.89);Muscle weakness (generalized) (M62.81);Low vision, both eyes (H54.2);Other symptoms and signs involving cognitive function;Hemiplegia and hemiparesis Hemiplegia - Right/Left: Right Hemiplegia - dominant/non-dominant: Dominant Hemiplegia - caused by: Cerebral infarction                Time: 9983-3825 OT Time Calculation (min): 32 min Charges:  OT General Charges $OT Visit: 1 Visit OT Evaluation $OT Eval Moderate Complexity: 1 Mod OT Treatments $Self Care/Home Management : 8-22 mins G-Codes:     [place], OTR/L Acute Rehab Pager: [phone] Office: [place] 12/17/2017, 11:08 AM

## 2017-12-17 NOTE — Progress Notes (Signed)
Inpatient Rehabilitation  Met with patient and daughters at bedside to discuss team's recommendation for IP Rehab.  Shared booklets, insurance verification letter, and answered questions.  Patient is eager to regain her independence.  I have an available bed today and have received medical clearance.  As a result, will proceed with an IP Rehab admission.  Call if questions.    Carmelia Roller., CCC/SLP Admission Coordinator  [place]  Cell [phone]

## 2017-12-17 NOTE — Progress Notes (Signed)
STROKE TEAM PROGRESS NOTE   HISTORY OF PRESENT ILLNESS (per record) [name] is an 72 y.o. female history of endometrial cancer, cholecystitis, smoker who presents with sudden onset aphasia and right-sided weakness.  Around 7 PM the patient was having dinner and suddenly noticed that she had facial droop and right arm and leg weakness. The patient also states she had difficulty getting words out and slurred speech. EMS was called and her symptoms have been resolving at the time she arrived. She was initially scored NIH 2  on arrival for mild drift and sensory symptoms however eventually her symptoms resolved completely by this and she complete her CT scan. CT head was negative for any hemorrhage.   Date last known well:  Time last known well:  7pm tPA Given: no, resolved symptoms  NIHSS: 0 Baseline MRS 0  SUBJECTIVE (INTERVAL HISTORY) The patient's daughter is again at the bedside. Aphasia and Right sided weakness much improved today.  OBJECTIVE Temp:  [97.7 F (36.5 C)-98.8 F (37.1 C)] 98.5 F (36.9 C) (01/22 0956) Pulse Rate:  [55-71] 55 (01/22 0956) Cardiac Rhythm: Normal sinus rhythm (01/22 0800) Resp:  [16-18] 18 (01/22 0956) BP: (133-169)/(43-57) 169/57 (01/22 0956) SpO2:  [93 %-96 %] 94 % (01/22 0956)  CBC:  Recent Labs  Lab 12/15/17 2029 12/15/17 2105  WBC 5.1  --   NEUTROABS 3.2  --   HGB 13.2 11.9*  HCT 38.7 35.0*  MCV 89.8  --   PLT 206  --    Basic Metabolic Panel:  Recent Labs  Lab 12/15/17 2029 12/15/17 2105  NA 142 145  K 3.6 3.6  CL 108 103  CO2 23  --   GLUCOSE 111* 107*  BUN 23* 24*  CREATININE 0.84 0.90  CALCIUM 8.5*  --    Lipid Panel:     Component Value Date/Time   CHOL 176 12/16/2017 0446   TRIG 57 12/16/2017 0446   HDL 41 12/16/2017 0446   CHOLHDL 4.3 12/16/2017 0446   VLDL 11 12/16/2017 0446   LDLCALC 124 (H) 12/16/2017 0446   HgbA1c:  Lab Results  Component Value Date   HGBA1C 5.1 12/16/2017   Urine Drug Screen: No  results found for: LABOPIA, COCAINSCRNUR, LABBENZ, AMPHETMU, THCU, LABBARB  Alcohol Level No results found for: [facility]  IMAGING  Mr [facility] Wo Contrast Mr Jodene Nam Neck W Wo Contrast 12/16/2017 IMPRESSION:  1. 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  2. Severe chronic ischemic microangiopathy.  3. No emergent large vessel occlusion or high-grade intracranial stenosis.  4. No flow limiting stenosis of the carotid or vertebral arteries.   Ct Head Code Stroke Wo Contrast 12/15/2017 IMPRESSION:  1. No acute intracranial abnormality  2. ASPECTS is 10  3. Atrophy and moderate to advanced chronic microvascular ischemia in the white matter.   Transthoracic Echocardiogram - Study Conclusions - Left ventricle: The cavity size was normal. Systolic function was   normal. The estimated ejection fraction was in the range of 60%   to 65%. Wall motion was normal; there were no regional wall   motion abnormalities. Doppler parameters are consistent with   abnormal left ventricular relaxation (grade 1 diastolic   dysfunction). - Atrial septum: No defect or patent foramen ovale was identified. Impressions: - No cardiac source of emboli was indentified  PHYSICAL EXAM Vitals:   12/16/17 1706 12/16/17 2020 12/17/17 0555 12/17/17 0956  BP: (!) 154/55 (!) 142/57 (!) 149/47 Marland Kitchen)  169/57  Pulse: 71 70 60 (!) 55  Resp: 18 18 18 18   Temp: 98.7 F (37.1 C) 97.9 F (36.6 C) 97.7 F (36.5 C) 98.5 F (36.9 C)  TempSrc: Oral Oral Oral Oral  SpO2: 95% 96% 96% 94%  Weight:      Height:       Pleasant elderly Caucasian lady currently not in distress. . Afebrile. Head is nontraumatic. Neck is supple without bruit.    Cardiac exam no murmur or gallop. Lungs are clear to auscultation. Distal pulses are well felt.  Neurological Exam ;  Awake  Alert oriented x 3. Normal speech and language.eye movements full without nystagmus.fundi were not  visualized. Vision acuity and fields appear normal. Hearing is normal. Palatal movements are normal. Face symmetric. Tongue midline. Normal strength, tone, reflexes and coordination. Normal sensation. Gait deferred.  HOSPITAL MEDICATIONS:  . aspirin  300 mg Rectal Daily   Or  . aspirin  325 mg Oral Daily  . atorvastatin  80 mg Oral q1800  . brinzolamide  1 drop Both Eyes BID  . enoxaparin (LOVENOX) injection  40 mg Subcutaneous Daily  . latanoprost  1 drop Both Eyes QHS  . multivitamin with minerals  1 tablet Oral Daily   ASSESSMENT/PLAN [name] is a 72 y.o. female with history of endometrial cancer, cholecystitis, and tobacco use presenting with sudden onset of aphasia and right-sided weakness. She did not receive IV t-PA due to resolution of deficits.  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 No cardiac source  LDL - 124  HgbA1c - 5.1  VTE prophylaxis - Lovenox Fall precautions Diet Heart Room service appropriate? Yes; Fluid consistency: Thin Diet - low sodium heart healthy  No antithrombotic prior to admission, now on aspirin 325 mg daily  Patient counseled to be compliant with her antithrombotic medications  Ongoing aggressive stroke risk factor management  Therapy recommendations:  CIR  Disposition:  CIR pending  12/16/2017: She presented with right facial droop and right hemiparesis but appears to have improved MRI does show left brain subcortical lacunar infarct. She remains at risk for neurological worsening and recurrent strokes. Recommend aspirin and Lipitor. Finish ongoing stroke workup.   12/17/2017: Neuro exam slowly improving. Able to speak complete  sentences today and right sided weakness improving slowly. Imaging and POC reviewed in detail with patient and daughter. All questions answered. Possible discharge to CIR.  Hypertension  Stable  Permissive hypertension (OK if < 220/120) but gradually normalize in 5-7 days  Long-term BP goal normotensive  Hyperlipidemia  Home meds:  No lipid lowering medications prior to admission  LDL 124, goal < 70  Now on Lipitor 80 mg daily  Continue statin at discharge  Other Stroke Risk Factors  Advanced age  Cigarette smoker - advised to stop smoking  Other Active Problems  Mild anemia  Mildly elevated BUN  Plan / Recommendations   Continue aspirin and Lipitor at discharge  Follow-up with Dr. Leonie Man in 6 weeks  Hospital day # Wineglass, Trey Paula Neurology Stroke Team 12/17/2017, 12:55 PM I have personally examined this patient, reviewed notes, independently viewed imaging studies, participated in  medical decision making and plan of care.ROS completed by me personally and pertinent positives fully documented  I have made any additions or clarifications directly to the above note. Agree with note above.   Neurology to sign off at this time. Please call with any further questions or concerns. Thank you for this consultation. Antony Contras, MD Medical Director [facility] Pager: [phone] 12/17/2017 1:28 PM    To contact Stroke Continuity provider, please refer to [email or web address] After hours, contact General Neurology

## 2017-12-17 NOTE — Progress Notes (Signed)
Arrived per bed to room 24. Pleasant, oriented only to name only. Denies pain. Skin warm and dry. Follows commands, weak on right side

## 2017-12-17 NOTE — Plan of Care (Signed)
Speech production improving throughout the day

## 2017-12-17 NOTE — Evaluation (Signed)
SLP Cancellation Note  Patient Details Name: [name] MRN: [id number] DOB: 1946/09/16   Cancelled treatment:       Reason Eval/Treat Not Completed: Fatigue/lethargy limiting ability to participate   Macario Golds 12/17/2017, 11:15 AM  Luanna Salk, [place] [facility] SLP [phone]

## 2017-12-17 NOTE — PMR Pre-admission (Signed)
PMR Admission Coordinator Pre-Admission Assessment  Patient: [name] is an 72 y.o., female MRN: [id number] DOB: [birth year] Height: 5\' 4"  (162.6 cm) Weight: 61.4 kg (135 lb 5.8 oz)              Insurance Information HMO:     PPO:      PCP:      IPA:      80/20:      OTHER:  PRIMARY: Medicare A & B      Policy#: [id number]      Subscriber: Self CM Name:       Phone#:      Fax#:  Pre-Cert#: Eligible       Employer: Full Time  Benefits:  Phone #: Verified online      Name: [employer]  Eff. Date: 09/26/09     Deduct: $1364      Out of Pocket Max: N/A      Life Max: N/A CIR: 100%      SNF: 100% days 1-20; 80% days 21-100 Outpatient: 80%     Co-Pay: 20% Home Health: 100%      Co-Pay: $0 DME: 80%     Co-Pay: 20% Providers: Patient's choice   SECONDARY: [employer]       Policy#: [id number]      Subscriber: Self CM Name:       Phone#:      Fax#:  Pre-Cert#:       Employer: Full TIme Benefits:  Phone #: [phone]     Name:  Eff. Date:      Deduct:       Out of Pocket Max:       Life Max:  CIR:       SNF:  Outpatient:      Co-Pay:  Home Health:       Co-Pay:  DME:      Co-Pay:   Medicaid Application Date:       Case Manager:  Disability Application Date:       Case Worker:   Emergency Contact Information Contact Information    Name Relation Home Work Mobile   [place] Daughter   [phone]     Current Medical History  Patient Admitting Diagnosis: left basal ganglia corpus striatum infarct  History of Present Illness: [name] a 72 y.o.right handed femalewith history of endometrial cancer status post hysterectomy, tobacco abuse. Per chart review, patient, and daughter,patient lives withdaughter andgrandson. 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 Two-level home with bedroom on main and 2 steps to entry. Presented 12/15/2017 with right-sided weakness with facial droop and aphasia as well as general malaise. Cranial CTreviewed,  unremarkable for acute process.Patient did not receive TPA. MRI the brain showed acute nonhemorrhagic infarction of left basal ganglia corpus striatumwithout associated mass effect. MRA with no emergent large vessel occlusion or stenosis. Echocardiogram with ejection fraction of 18% grade 1 diastolic dysfunction. Neurology consulted presently on aspirin for CVA prophylaxis. SubcutaneousLovenox for DVT prophylaxis. Tolerating a regular diet. Presently on Rocephin empirically for suspect UTI. Physical and occupational therapy evaluations completed with recommendations of physical medicine rehabilitation consult. Patient was admitted for a comprehensive rehabilitation program 12/17/17.   NIH Total: 2    Past Medical History  Past Medical History:  Diagnosis Date  . Chronic cholecystitis   . Complication of anesthesia   . Diverticulosis   . Endometrial cancer ([place]) 1977   s/p TAH  . History of transfusion 1970's  . PONV (postoperative  nausea and vomiting)     Family History  family history includes Obesity in her daughter.  Prior Rehab/Hospitalizations:  Has the patient had major surgery during 100 days prior to admission? No  Current Medications   Current Facility-Administered Medications:  .  acetaminophen (TYLENOL) tablet 650 mg, 650 mg, Oral, Q4H PRN **OR** acetaminophen (TYLENOL) solution 650 mg, 650 mg, Per Tube, Q4H PRN **OR** acetaminophen (TYLENOL) suppository 650 mg, 650 mg, Rectal, Q4H PRN, Opyd, Timothy S, MD .  aspirin suppository 300 mg, 300 mg, Rectal, Daily **OR** aspirin tablet 325 mg, 325 mg, Oral, Daily, Opyd, Ilene Qua, MD, 325 mg at 12/17/17 0912 .  atorvastatin (LIPITOR) tablet 80 mg, 80 mg, Oral, q1800, Opyd, Ilene Qua, MD, 80 mg at 12/16/17 1837 .  brinzolamide (AZOPT) 1 % ophthalmic suspension 1 drop, 1 drop, Both Eyes, BID, Opyd, Ilene Qua, MD, 1 drop at 12/17/17 0914 .  cefTRIAXone (ROCEPHIN) 1 g in dextrose 5 % 50 mL IVPB, 1 g, Intravenous, Q24H, Vann, Jessica  U, DO, Stopped at 12/17/17 0959 .  enoxaparin (LOVENOX) injection 40 mg, 40 mg, Subcutaneous, Daily, Opyd, Ilene Qua, MD, 40 mg at 12/17/17 0912 .  labetalol (NORMODYNE,TRANDATE) injection 5-10 mg, 5-10 mg, Intravenous, Q2H PRN, Opyd, Timothy S, MD .  latanoprost (XALATAN) 0.005 % ophthalmic solution 1 drop, 1 drop, Both Eyes, QHS, Opyd, Ilene Qua, MD, 1 drop at 12/16/17 2121 .  multivitamin with minerals tablet 1 tablet, 1 tablet, Oral, Daily, Opyd, Ilene Qua, MD, 1 tablet at 12/17/17 0912 .  senna-docusate (Senokot-S) tablet 1 tablet, 1 tablet, Oral, QHS PRN, Opyd, Ilene Qua, MD  Patients Current Diet: Fall precautions Diet Heart Room service appropriate? 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  She was active and fully independent prior to admission.    Home Assistive Devices / Equipment Home Assistive Devices/Equipment: None Home Equipment: Walker - 2 wheels, Bedside commode  Prior Device Use: Indicate devices/aids used by the patient prior to current illness, exacerbation or injury? None of the above  Prior Functional Level Prior Function Level of Independence: Independent Comments: independent in ADLs, IADLs, works 2 jobs Librarian, academic), drives   Self Care: Did the patient need help bathing, dressing, using the toilet or eating? Independent  Indoor Mobility: Did the patient need assistance with walking from room to room (with or without device)? Independent  Stairs: Did the patient need assistance with internal or external stairs (with or without device)? Independent  Functional Cognition: Did the patient need help planning regular tasks such as  shopping or remembering to take medications? Independent  Current Functional Level Cognition  Overall Cognitive Status: Impaired/Different from baseline Difficult to assess due to: Level of arousal Current Attention Level: Selective Orientation Level: Oriented X4 Following Commands: Follows one step commands consistently Safety/Judgement: Decreased awareness of safety, Decreased awareness of deficits General Comments: Pt with poor initiation of tasks, requiring verbal and tactile cues. Decreased problem solving during mobility. Increased time for processing and cues throughout session    Extremity Assessment (includes Sensation/Coordination)  Upper Extremity Assessment: RUE deficits/detail RUE Deficits / Details: Poor grasp strength and FM skills as seen during funcitonal tasks and testing (i.e. pt unabel to [employer]  grasp during grooming and dropped items). Pt able to perform finger oppositiong with significant amount of time and effort. Pt demonstrating undershooting for target reaching.  RUE Sensation: decreased light touch RUE Coordination: decreased fine motor, decreased gross motor  Lower Extremity Assessment: Defer to PT evaluation RLE Deficits / Details: AROM WFL, strength grossly assessed at 3/5 at hip, and 4/5 in knee and ankle. RLE Coordination: decreased gross motor, decreased fine motor    ADLs  Overall ADL's : Needs assistance/impaired Eating/Feeding: Minimal assistance, Sitting Grooming: Maximal assistance, Standing, Oral care, Wash/dry hands, Wash/dry face Grooming Details (indicate cue type and reason): Max A for standing balance with significant R lateral lean. Pt requiring Max cues to recognize her was leaning and then required Max A for correct balance. Pt requiring cues during grooming to initiate and sequence tasks. Pt demosntrating poor attention, processing, problem solving, and awarness. Upper Body Bathing: Moderate assistance, Sitting Lower Body Bathing: Maximal  assistance, Sit to/from stand Upper Body Dressing : Moderate assistance, Sitting Lower Body Dressing: Maximal assistance, Sit to/from stand Toilet Transfer: Moderate assistance, Ambulation, RW, Regular Toilet, Maximal assistance Toilet Transfer Details (indicate cue type and reason): Mod A to power up into standing. Max A for R lateral lean in standing.  Toileting- Clothing Manipulation and Hygiene: Maximal assistance, Sit to/from stand, Cueing for sequencing, Cueing for safety Toileting - Clothing Manipulation Details (indicate cue type and reason): Pt requiring Max A for standing balance during toielt hygiene and pulling up underwear due to signficant R lateral lean. Functional mobility during ADLs: Moderate assistance, Rolling walker(Bumping into wall and object on R side) General ADL Comments: Pt dmeonstrating decreased funcitonal perform and poor awarness of deficits and safety. Pt presenting with poor grasp strength, FM skills, and funcitonal use of RUE (dominant hand). Pt with decreased cognition dmeonstrating poor attention, problem solving, and awarness. Pt requiring Max cues throughout session for performance of ADLs.    Mobility  Overal bed mobility: Needs Assistance Bed Mobility: Supine to Sit Supine to sit: Min assist General bed mobility comments: in chair on arrival    Transfers  Overall transfer level: Needs assistance Equipment used: 1 person hand held assist Transfers: Sit to/from Stand Sit to Stand: Min assist Stand pivot transfers: Mod assist General transfer comment: Pt performed sit<>stand 10x for neuromuscular re-education. Manual facilitation required several times at R quad to encourage full knee extension. Pt eventually able to achieve full knee extension with VC and increased time.    Ambulation / Gait / Stairs / Wheelchair Mobility  Ambulation/Gait Ambulation/Gait assistance: Museum/gallery curator (Feet): 80 Feet(16x5) Assistive device: 1 person hand  held assist(Hand rail on L and HHA on R) Gait Pattern/deviations: Decreased step length - right, Decreased stance time - right, Decreased weight shift to left, Narrow base of support, Step-to pattern General Gait Details: Pt with modest R lateral lean during ambulation. Min A for steadying with HHA. Cues for increased knee extension on R. Gait velocity: slowed Gait velocity interpretation: Below normal speed for age/gender    Posture / Balance Dynamic Sitting Balance Sitting balance - Comments: needs R UE support to correct R lean Balance Overall balance assessment: Needs assistance Sitting-balance support: Feet supported, Bilateral upper extremity supported Sitting balance-Leahy Scale: Poor Sitting balance - Comments: needs R UE support to correct R lean Postural control: Right lateral lean Standing balance support: Bilateral upper extremity supported Standing balance-Leahy Scale: Poor Standing balance comment: minA for maintaining balance    Special needs/care consideration BiPAP/CPAP: No CPM:  No Continuous Drip IV: No Dialysis: No         Life Vest; No Oxygen; No Special Bed; No Trach Size: No Wound Vac (area): No       Skin: Bruising to bilateral arms                               Bowel mgmt: Continent, last BM 1/22 Bladder mgmt: Incontinent due to increased urgency and frequency Diabetic mgmt: No, HgbA1c - 5.1        Previous Home Environment Living Arrangements: Other relatives Available Help at Discharge: Family, Available 24 hours/day Type of Home: House Home Layout: Two level Alternate Level Stairs-Rails: Left Alternate Level Stairs-Number of Steps: 13 Home Access: Stairs to enter Entrance Stairs-Rails: Left Entrance Stairs-Number of Steps: 2 Bathroom Shower/Tub: Multimedia programmer: Programmer, systems: Yes Home Care Services: No Additional Comments: Home living and prior function collected from daughters  Discharge Living  Setting Plans for Discharge Living Setting: Patient's home, Lives with (comment)(grandson ) Type of Home at Discharge: House Discharge Home Layout: Two level, Able to live on main level with bedroom/bathroom(office and laundry are in the basement) Alternate Level Stairs-Rails: Left Alternate Level Stairs-Number of Steps: 12 Discharge Home Access: Stairs to enter Entrance Stairs-Rails: Left Entrance Stairs-Number of Steps: 3 Discharge Bathroom Shower/Tub: Walk-in shower Discharge Bathroom Toilet: Standard Discharge Bathroom Accessibility: Yes How Accessible: Accessible via walker Does the patient have any problems obtaining your medications?: No  Social/Family/Support Systems Patient Roles: Parent, Other (Comment)(Grandparent ) Contact Information: Daughters: Lynelle Smoke [phone] Anticipated Caregiver: Lynelle Smoke and grandson, Brandon  Anticipated Caregiver's Contact Information: see above  Ability/Limitations of Caregiver: None Caregiver Availability: 24/7 Discharge Plan Discussed with Primary Caregiver: Yes Is Caregiver In Agreement with Plan?: Yes Does Caregiver/Family have Issues with Lodging/Transportation while Pt is in Rehab?: No  Goals/Additional Needs Patient/Family Goal for Rehab: PT/OT: Supervision-Min A; SLP: Mod I  Expected length of stay: 13-17 days  Cultural Considerations: None Dietary Needs: Heart Healthy diet restrictions  Equipment Needs: TBD Special Service Needs: None Pt/Family Agrees to Admission and willing to participate: Yes Program Orientation Provided & Reviewed with Pt/Caregiver Including Roles  & Responsibilities: Yes  Decrease burden of Care through IP rehab admission: No  Possible need for SNF placement upon discharge: No  Patient Condition: This patient's condition remains as documented in the consult dated 12/17/17, in which the Rehabilitation Physician determined and documented that the patient's condition is appropriate for  intensive rehabilitative care in an inpatient rehabilitation facility. Will admit to inpatient rehab today.  Preadmission Screen Completed By:  Gunnar Fusi, 12/17/2017 2:46 PM ______________________________________________________________________   Discussed status with Dr. Naaman Plummer on 12/17/17 at 1450 and received telephone approval for admission today.  Admission Coordinator:  Gunnar Fusi, time 1450/Date 12/17/17

## 2017-12-17 NOTE — Progress Notes (Signed)
Physical Therapy Treatment Patient Details Name: [name] MRN: [id number] DOB: 1946/10/29 Today's Date: 12/17/2017    History of Present Illness 72 year old female history of tobacco use, cancer presents with R side weakness, aphasia at 7pm. Her symptoms resolved completely shortly after arrival . She was admitted to the medicine team. 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 MRI head and neck did not show significant stenosis    PT Comments    Pt is showing increased activity tolerance and progressing towards goals. Spoke with evaluating PT and updated d/c recommendations to CIR. Pt ambulated in hall with min A to steady and cues for technique. She performed sit<>stand 10x. Manual facilitation given at R Knee to encourage knee extension, pt progressed to correcting knee flexion with cueing. She would benefit from continued skilled PT to increase safety with mobility and functional independence. Expected to d/c to CIR this PM.   Follow Up Recommendations  CIR     Equipment Recommendations  Other (comment)(pt daughter reports RW and 3 in 1 at home)    Recommendations for Other Services       Precautions / Restrictions Precautions Precautions: Fall Restrictions Weight Bearing Restrictions: No    Mobility  Bed Mobility Overal bed mobility: Needs Assistance Bed Mobility: Supine to Sit     Supine to sit: Min assist     General bed mobility comments: in chair on arrival  Transfers Overall transfer level: Needs assistance Equipment used: 1 person hand held assist Transfers: Sit to/from Stand Sit to Stand: Min assist Stand pivot transfers: Mod assist       General transfer comment: Pt performed sit<>stand 10x for neuromuscular re-education. Manual facilitation required several times at R quad to encourage full knee extension. Pt eventually able to achieve full knee extension with VC and increased  time.  Ambulation/Gait Ambulation/Gait assistance: Min assist Ambulation Distance (Feet): 80 Feet(16x5) Assistive device: 1 person hand held assist(Hand rail on L and HHA on R) Gait Pattern/deviations: Decreased step length - right;Decreased stance time - right;Decreased weight shift to left;Narrow base of support;Step-to pattern Gait velocity: slowed Gait velocity interpretation: Below normal speed for age/gender General Gait Details: Pt with modest R lateral lean during ambulation. Min A for steadying with HHA. Cues for increased knee extension on R.   Stairs            Wheelchair Mobility    Modified Rankin (Stroke Patients Only) Modified Rankin (Stroke Patients Only) Pre-Morbid Rankin Score: Moderately severe disability Modified Rankin: No symptoms     Balance Overall balance assessment: Needs assistance Sitting-balance support: Feet supported;Bilateral upper extremity supported Sitting balance-Leahy Scale: Poor Sitting balance - Comments: needs R UE support to correct R lean Postural control: Right lateral lean Standing balance support: Bilateral upper extremity supported Standing balance-Leahy Scale: Poor Standing balance comment: minA for maintaining balance                            Cognition Arousal/Alertness: Awake/alert Behavior During Therapy: Flat affect Overall Cognitive Status: Impaired/Different from baseline Area of Impairment: Problem solving;Safety/judgement                   Current Attention Level: Selective Memory: Decreased short-term memory Following Commands: Follows one step commands consistently Safety/Judgement: Decreased awareness of safety;Decreased awareness of deficits Awareness: Emergent Problem Solving: Slow processing;Decreased initiation;Difficulty sequencing;Requires verbal cues;Requires tactile cues General Comments: Pt with poor initiation of tasks, requiring  verbal and tactile cues. Decreased problem  solving during mobility. Increased time for processing and cues throughout session      Exercises      General Comments General comments (skin integrity, edema, etc.): Daughter present during session.      Pertinent Vitals/Pain Pain Assessment: No/denies pain    Home Living Family/patient expects to be discharged to:: Private residence Living Arrangements: Other relatives Available Help at Discharge: Family;Available 24 hours/day Type of Home: House Home Access: Stairs to enter Entrance Stairs-Rails: Left Home Layout: Two level Home Equipment: Walker - 2 wheels;Bedside commode Additional Comments: Home living and prior function collected from daughters    Prior Function Level of Independence: Independent      Comments: independent in ADLs, IADLs, works 2 jobs Librarian, academic), drives    PT Goals (current goals can now be found in the care plan section) Acute Rehab PT Goals Patient Stated Goal: Get better PT Goal Formulation: Patient unable to participate in goal setting Time For Goal Achievement: 12/30/17 Potential to Achieve Goals: Fair Progress towards PT goals: Progressing toward goals    Frequency    Min 4X/week      PT Plan Discharge plan needs to be updated    Co-evaluation              AM-PAC PT "6 Clicks" Daily Activity  Outcome Measure  Difficulty turning over in bed (including adjusting bedclothes, sheets and blankets)?: A Lot Difficulty moving from lying on back to sitting on the side of the bed? : Unable Difficulty sitting down on and standing up from a chair with arms (e.g., wheelchair, bedside commode, etc,.)?: Unable Help needed moving to and from a bed to chair (including a wheelchair)?: A Little Help needed walking in hospital room?: A Little Help needed climbing 3-5 steps with a railing? : Total 6 Click Score: 11    End of Session Equipment Utilized During Treatment: Gait belt Activity Tolerance: Patient tolerated treatment  well Patient left: with family/visitor present;in chair Nurse Communication: Mobility status PT Visit Diagnosis: Unsteadiness on feet (R26.81);Other abnormalities of gait and mobility (R26.89);Muscle weakness (generalized) (M62.81);Difficulty in walking, not elsewhere classified (R26.2);Other symptoms and signs involving the nervous system (R29.898);Hemiplegia and hemiparesis Hemiplegia - Right/Left: Right Hemiplegia - dominant/non-dominant: Dominant Hemiplegia - caused by: Cerebral infarction     Time: 1137-1208 PT Time Calculation (min) (ACUTE ONLY): 31 min  Charges:  $Neuromuscular Re-education: 23-37 mins                    G Codes:       Benjiman Core, [place] Pager [phone] Acute Rehab   Allena Katz 12/17/2017, 12:26 PM

## 2017-12-17 NOTE — H&P (Signed)
Physical Medicine and Rehabilitation Admission H&P     Chief Complaint  Patient presents with  . Code Stroke  :  HPI: [name] is a 72 y.o. right handed female with history of endometrial cancer status post hysterectomy, tobacco abuse. Per chart review, patient, and daughter, patient lives with daughter and grandson. 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 Two-level home with bedroom on main and 2 steps to entry. Presented 12/15/2017 with right-sided weakness with facial droop and aphasia as well as general malaise. Cranial CT reviewed, unremarkable for acute process. Patient did not receive TPA. 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 MRA with no emergent large vessel occlusion or stenosis. Echocardiogram with ejection fraction of 03% grade 1 diastolic dysfunction. Neurology consulted presently on aspirin for CVA prophylaxis. Subcutaneous Lovenox for DVT prophylaxis. Tolerating a regular diet. Presently on Rocephin empirically for suspect UTI. Physical and occupational therapy evaluations completed with recommendations of physical medicine rehabilitation consult. Patient was admitted for a comprehensive rehabilitation program  Review of Systems  Constitutional: Negative for chills and fever.  HENT: Negative for hearing loss.  Eyes: Negative for blurred vision and double vision.  Respiratory: Positive for cough. Negative for shortness of breath.  Cardiovascular: Negative for chest pain, palpitations and leg swelling.  Gastrointestinal: Positive for constipation. Negative for nausea and vomiting.  Genitourinary: Negative for dysuria, flank pain and hematuria.  Musculoskeletal: Positive for myalgias.  Skin: Negative for rash.  Neurological: Positive for speech change and focal weakness. Negative for seizures.  All other systems reviewed and are negative.       Past Medical History:    Diagnosis Date  . Chronic cholecystitis   . Complication of anesthesia   . Diverticulosis   . Endometrial cancer ([place]) 1977   s/p TAH  . History of transfusion 1970's  . PONV (postoperative nausea and vomiting)         Past Surgical History:  Procedure Laterality Date  . ABDOMINAL HYSTERECTOMY  1977   for endometrial cancer  . HIP ARTHROPLASTY Left 11/18/2016   Procedure: ARTHROPLASTY BIPOLAR HIP (HEMIARTHROPLASTY); Surgeon: Nicholes Stairs, MD; Location: [place]; Service: Orthopedics; Laterality: Left;  . LAPAROSCOPIC CHOLECYSTECTOMY SINGLE SITE WITH INTRAOPERATIVE CHOLANGIOGRAM N/A 07/19/2015   Procedure: LAPAROSCOPIC CHOLECYSTECTOMY SINGLE SITE WITH INTRAOPERATIVE CHOLANGIOGRAM; Surgeon: Michael Boston, MD; Location: [facility]; Service: General; Laterality: N/A;  . TONSILLECTOMY    . TUBAL LIGATION          Family History  Problem Relation Age of Onset  . Obesity Daughter    Social History: reports that she has been smoking. she has never used smokeless tobacco. She reports that she does not drink alcohol or use drugs.  Allergies:       Allergies  Allergen Reactions  . Penicillins Nausea And Vomiting    Has patient had a PCN reaction causing immediate rash, facial/tongue/throat swelling, SOB or lightheadedness with hypotension: no  Has patient had a PCN reaction causing severe rash involving mucus membranes or skin necrosis:no  Has patient had a PCN reaction that required hospitalization no  Has patient had a PCN reaction occurring within the last 10 years: no  If all of the above answers are "NO", then may proceed with Cephalosporin use.         Medications Prior to Admission  Medication Sig Dispense Refill  . brinzolamide (AZOPT) 1 % ophthalmic suspension Place 1 drop into both eyes 2 (two) times daily.    Marland Kitchen  Cyanocobalamin (VITAMIN B-12 PO) Take 1 tablet by mouth daily.    Marland Kitchen ibuprofen (ADVIL,MOTRIN) 200 MG tablet Take 400-600 mg by mouth every 6 (six) hours as needed  (pain).    . Latanoprostene Bunod (VYZULTA) 0.024 % SOLN Place 1 drop into both eyes at bedtime.    . Multiple Vitamin (MULTIVITAMIN WITH MINERALS) TABS tablet Take 1 tablet by mouth daily.     Drug Regimen Review  Drug regimen was reviewed and remains appropriate with no significant issues identified  Home:  Home Living  Family/patient expects to be discharged to:: Private residence  Living Arrangements: Other relatives  Available Help at Discharge: Family, Available 24 hours/day  Type of Home: House  Home Access: Stairs to enter  [employer] of Steps: 2  Entrance Stairs-Rails: Left  Home Layout: Two level  Alternate Level Stairs-Number of Steps: 13  Alternate Level Stairs-Rails: Left  Bathroom Shower/Tub: Tourist information centre manager: Standard  Bathroom Accessibility: Yes  Home Equipment: Environmental consultant - 2 wheels, Bedside commode  Additional Comments: Home living and prior function collected from daughters  Functional History:  Prior Function  Level of Independence: Independent  Comments: independent in ADLs, IADLs, works 2 jobs Librarian, academic), drives  Functional Status:  Mobility:  Bed Mobility  Overal bed mobility: Needs Assistance  Bed Mobility: Supine to Sit  Supine to sit: Min assist  General bed mobility comments: minA for management of R LE into bed  Transfers  Overall transfer level: Needs assistance  Equipment used: 1 person hand held assist, Rolling walker (2 wheeled)  Transfers: Sit to/from Stand, [employer] Transfers  Sit to Stand: Min assist  Stand pivot transfers: Mod assist  General transfer comment: modA for stand pivot to/from [facility] for powerup and stedying, decreased ability to move R LE. minA for powerup to RW with sit>stand,.vc for hand placement for power up  Ambulation/Gait  Ambulation/Gait assistance: Mod assist  Ambulation Distance (Feet): 40 Feet  Assistive device: Rolling walker (2 wheeled)  Gait Pattern/deviations: Decreased step length -  right, Decreased stance time - right, Decreased weight shift to left, Shuffle, Narrow base of support, Staggering right  General Gait Details: modA for steadying with RW and correcting R lateral lean, verbal cues and minA for weightshift to L to advance R LE, no buckling of R knee with weightbearing, pt states she is aware she is leaning R but can not correct  Gait velocity: slowed  Gait velocity interpretation: Below normal speed for age/gender   ADL:  ADL  Overall ADL's : Needs assistance/impaired  Eating/Feeding: Minimal assistance, Sitting  Grooming: Maximal assistance, Standing, Oral care, Wash/dry hands, Wash/dry face  Grooming Details (indicate cue type and reason): Max A for standing balance with significant R lateral lean. Pt requiring Max cues to recognize her was leaning and then required Max A for correct balance. Pt requiring cues during grooming to initiate and sequence tasks. Pt demosntrating poor attention, processing, problem solving, and awarness.  Upper Body Bathing: Moderate assistance, Sitting  Lower Body Bathing: Maximal assistance, Sit to/from stand  Upper Body Dressing : Moderate assistance, Sitting  Lower Body Dressing: Maximal assistance, Sit to/from stand  Toilet Transfer: Moderate assistance, Ambulation, RW, Regular Toilet, Maximal assistance  Toilet Transfer Details (indicate cue type and reason): Mod A to power up into standing. Max A for R lateral lean in standing.  Toileting- Clothing Manipulation and Hygiene: Maximal assistance, Sit to/from stand, Cueing for sequencing, Cueing for safety  Toileting - Clothing Manipulation Details (indicate cue type  and reason): Pt requiring Max A for standing balance during toielt hygiene and pulling up underwear due to signficant R lateral lean.  Functional mobility during ADLs: Moderate assistance, Rolling walker(Bumping into wall and object on R side)  General ADL Comments: Pt dmeonstrating decreased funcitonal perform and poor  awarness of deficits and safety. Pt presenting with poor grasp strength, FM skills, and funcitonal use of RUE (dominant hand). Pt with decreased cognition dmeonstrating poor attention, problem solving, and awarness. Pt requiring Max cues throughout session for performance of ADLs.  Cognition:  Cognition  Overall Cognitive Status: Impaired/Different from baseline  Orientation Level: Oriented X4  Cognition  Arousal/Alertness: Awake/alert  Behavior During Therapy: Flat affect  Overall Cognitive Status: Impaired/Different from baseline  Area of Impairment: Attention, Memory, Following commands, Safety/judgement, Problem solving, Awareness  Current Attention Level: Sustained  Memory: Decreased short-term memory  Following Commands: Follows one step commands inconsistently, Follows one step commands with increased time  Safety/Judgement: Decreased awareness of safety, Decreased awareness of deficits  Awareness: Emergent  Problem Solving: Slow processing, Decreased initiation, Difficulty sequencing, Requires verbal cues, Requires tactile cues  General Comments: Pt demonstratign poor cognition. Pt unable to maintain attention and required quite environment to perform ADLs. Poor initation of tasks and required verbal and visual cues. Decreased problem sovling during ADL tasks. Required increased time and cues throughout session.  Difficult to assess due to: Level of arousal  Physical Exam:  Blood pressure (!) 169/57, pulse (!) 55, temperature 98.5 F (36.9 C), temperature source Oral, resp. rate 18, height 5\' 4"  (1.626 m), weight 61.4 kg (135 lb 5.8 oz), SpO2 94 %.  Physical Exam  Vitals reviewed.  HENT:  Right facial droop  Eyes: EOM are normal. Right eye exhibits no discharge. Left eye exhibits no discharge.  Neck: Normal range of motion. Neck supple. No thyromegaly present.  Cardiovascular: Normal rate, regular rhythm and normal heart sounds. Exam reveals no friction rub.  No murmur heard.    Respiratory: Effort normal and breath sounds normal. No respiratory distress.  GI: Soft. Bowel sounds are normal. She exhibits no distension. There is no tenderness.  Skin. Warm and dry  Musculoskeletal. No edema and nontender  Neurological: Patient is generally alert. Able to follow 1 step commands. Demonstrates word finding deficits. Left upper extremity left lower extremity grossly 5 out of 5 proximal to distal. Right upper extremity 4 out of 5 with fine motor movements decreased. Right pronator drift noted. Right lower extremity grossly 4- proximal to 5 out of 5 distally. Does sense pain on the right side.  But generally cooperative Psych: Patient flat  Lab Results Last 48 Hours  Imaging Results (Last 48 hours)     Medical Problem List and Plan:  1. 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 DVT Prophylaxis/Anticoagulation: Subcutaneous Lovenox. Monitor platelet counts in any signs of bleeding  3. Pain Management: Tylenol as needed  4. Mood: Provide emotional support  5. Neuropsych: This patient is capable of making decisions on her own  behalf.  6. Skin/Wound Care: Routine skin checks  7. Fluids/Electrolytes/Nutrition: Routine I&O's with follow-up chemistries  8. Hyperlipidemia. Lipitor  9. Tobacco abuse. Counseling  10. History of endometrial cancer status post hysterectomy   Post Admission Physician Evaluation:  1. Functional deficits secondary to left basal ganglia/corpus stratum infarct. 2. Patient is admitted to receive collaborative, interdisciplinary care between the physiatrist, rehab nursing staff, and therapy team. 3. Patient's level of medical complexity and substantial therapy needs in context of that medical necessity cannot be provided at a lesser intensity of care such as a SNF. 4. Patient has experienced substantial functional loss from his/her baseline which was documented above under the "Functional History" and "Functional Status" headings. Judging by the patient's diagnosis, physical exam, and functional history, the patient has potential for functional progress which will result in measurable gains while on inpatient rehab. These gains will be of substantial and practical use upon discharge in facilitating mobility and self-care at the household level. 5. Physiatrist will provide 24 hour management of medical needs as well as oversight of the therapy plan/treatment and provide guidance as appropriate regarding the interaction of the two. 6. The Preadmission Screening has been reviewed and patient status is unchanged unless otherwise stated above. 7. 24 hour rehab nursing will assist with bladder management, bowel management, safety, skin/wound care, disease management, medication administration and patient education and help integrate therapy concepts, techniques,education, etc. 8. PT will assess and treat for/with: Lower extremity strength, range of motion, stamina, balance, functional mobility, safety, adaptive techniques and equipment, NMR Goals are: Supervision to minimal assistance. 9. OT will assess and  treat for/with: ADL's, functional mobility, safety, upper extremity strength, adaptive techniques and equipment . Goals are: Supervision to minimal assistance. Therapy may proceed with showering this patient. 10. SLP will assess and treat for/with: Communication/speech/cognition. Goals are: Modified independent. 11. Case Management and Social Worker will assess and treat for psychological issues and discharge planning. 12. Team conference will be held weekly to assess progress toward goals and to determine barriers to discharge. 13. Patient will receive at least 3 hours of therapy per day at least 5 days per week. 14. ELOS: 13-17 days  15. Prognosis: excellent   Meredith Staggers, MD, [facility]  12/17/2017  Lavon Paganini [place], PA-C  12/17/2017

## 2017-12-17 NOTE — Discharge Summary (Signed)
Physician Discharge Summary  [name] JYN:[id number] DOB: Mar 09, 1946 DOA: 12/15/2017  PCP: Briscoe Deutscher, MD  Admit date: 12/15/2017 Discharge date: 12/17/2017   Recommendations for Outpatient Follow-Up:   1. To CIR 2. PO abx x 4 more days   Discharge Diagnosis:   Principal Problem:   Acute CVA (cerebrovascular accident) ([place]) Active Problems:   Right sided weakness   Aphasia   History of endometrial cancer   Chronic diastolic congestive heart failure (HCC)   Hypertension   Acute blood loss anemia   Pure hypercholesterolemia   Acute lower UTI   Discharge disposition:  CIR  Discharge Condition: Improved.  Diet recommendation: Low sodium, heart healthy  Wound care: None.   History of Present Illness:   [name] is a 72 y.o. female with medical history significant for endometrial cancer status post hysterectomy, now presenting to the emergency department for evaluation of acute onset of right-sided weakness.  Patient reports that she had been experiencing some palpitations last night, had a nonspecific malaise this evening, and then while eating dinner with family, was noted to have difficulty using her utensils with her dominant right hand, had a right facial droop, and some speech difficulty.  She got up from the table and noted weakness involving the left arm and leg as well.  EMS was called and the patient was transported to the ED as a code stroke.     Hospital Course by Problem:   Acute CVA due to small vessel disease -Presents with acute-onset of right-sided weakness and aphasia -Reports palpitations the night before: tele shows NSR -Neurology is consulting and much appreciated -echocardiogram done -MRA of neck done in ER -LDL: > 70-- statin -HgbA1c: 5.1 -Start ASA 325 PT/OT-- CIR  UTI (pateint having frequency) -PO ceftin x 4 more days  Current smoker -Counseled toward cessation      Medical Consultants:    CIR    neuro   Discharge Exam:   Vitals:   12/17/17 0555 12/17/17 0956  BP: (!) 149/47 (!) 169/57  Pulse: 60 (!) 55  Resp: 18 18  Temp: 97.7 F (36.5 C) 98.5 F (36.9 C)  SpO2: 96% 94%   Vitals:   12/16/17 1706 12/16/17 2020 12/17/17 0555 12/17/17 0956  BP: (!) 154/55 (!) 142/57 (!) 149/47 (!) 169/57  Pulse: 71 70 60 (!) 55  Resp: 18 18 18 18   Temp: 98.7 F (37.1 C) 97.9 F (36.6 C) 97.7 F (36.5 C) 98.5 F (36.9 C)  TempSrc: Oral Oral Oral Oral  SpO2: 95% 96% 96% 94%  Weight:      Height:        Gen:  NAD-- very unsteady with PT   The results of significant diagnostics from this hospitalization (including imaging, microbiology, ancillary and laboratory) are listed below for reference.     Procedures and Diagnostic Studies:   Mr Jodene Nam Neck W Wo Contrast  Result Date: 12/16/2017 CLINICAL DATA:  New onset aphasia and right-sided weakness EXAM: MR HEAD WITHOUT CONTRAST MR CIRCLE OF WILLIS WITHOUT CONTRAST MRA OF THE NECK WITHOUT AND WITH CONTRAST TECHNIQUE: Multiplanar, multiecho pulse sequences of the brain, circle of willis and surrounding structures were obtained without intravenous contrast. Angiographic images of the neck were obtained using MRA technique without and with intravenous contrast. CONTRAST:  43mL MULTIHANCE GADOBENATE DIMEGLUMINE 529 MG/ML IV SOLN COMPARISON:  Head CT 12/15/2017 FINDINGS: MRI HEAD FINDINGS Brain: The midline structures are normal. There is abnormal diffusion restriction in the left corpus striatum. No acute  hemorrhage. There is severe periventricular, juxtacortical and deep white matter leukoaraiosis, most commonly seen in the setting of chronic ischemic microangiopathy. No mass lesion. No chronic microhemorrhage or cerebral amyloid angiopathy. No hydrocephalus, age advanced atrophy or lobar predominant volume loss. No dural abnormality or extra-axial collection. Skull and upper cervical spine: The visualized skull base, calvarium, upper cervical  spine and extracranial soft tissues are normal. Sinuses/Orbits: No fluid levels or advanced mucosal thickening. No mastoid effusion. Normal orbits. MRA HEAD FINDINGS Intracranial internal carotid arteries: Normal. Anterior cerebral arteries: Normal. Middle cerebral arteries: Normal. Posterior communicating arteries: Present on the right. Posterior cerebral arteries: Normal. Basilar artery: Normal. Vertebral arteries: Left dominant. Normal. Superior cerebellar arteries: Normal. Anterior inferior cerebellar arteries: Normal. Posterior inferior cerebellar arteries: Normal. MRA NECK FINDINGS Aortic arch: Normal 3 vessel aortic branching pattern. The visualized subclavian arteries are normal. Right carotid system: Normal course and caliber without stenosis or evidence of dissection. Left carotid system: Normal course and caliber without stenosis or evidence of dissection. Vertebral arteries: Left dominant. Vertebral artery origins are poorly visualized. Vertebral arteries are normal in course and caliber to the vertebrobasilar confluence without stenosis or evidence of dissection. IMPRESSION: 1. 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 2. Severe chronic ischemic microangiopathy. 3. No emergent large vessel occlusion or high-grade intracranial stenosis. 4. No flow limiting stenosis of the carotid or vertebral arteries. Electronically Signed   By: Ulyses Jarred M.D.   On: 12/16/2017 02:16   Mr Brain Wo Contrast  Result Date: 12/16/2017 CLINICAL DATA:  New onset aphasia and right-sided weakness EXAM: MR HEAD WITHOUT CONTRAST MR CIRCLE OF WILLIS WITHOUT CONTRAST MRA OF THE NECK WITHOUT AND WITH CONTRAST TECHNIQUE: Multiplanar, multiecho pulse sequences of the brain, circle of willis and surrounding structures were obtained without intravenous contrast. Angiographic images of the neck were obtained using MRA technique without and with intravenous contrast.  CONTRAST:  33mL MULTIHANCE GADOBENATE DIMEGLUMINE 529 MG/ML IV SOLN COMPARISON:  Head CT 12/15/2017 FINDINGS: MRI HEAD FINDINGS Brain: The midline structures are normal. There is abnormal diffusion restriction in the left corpus striatum. No acute hemorrhage. There is severe periventricular, juxtacortical and deep white matter leukoaraiosis, most commonly seen in the setting of chronic ischemic microangiopathy. No mass lesion. No chronic microhemorrhage or cerebral amyloid angiopathy. No hydrocephalus, age advanced atrophy or lobar predominant volume loss. No dural abnormality or extra-axial collection. Skull and upper cervical spine: The visualized skull base, calvarium, upper cervical spine and extracranial soft tissues are normal. Sinuses/Orbits: No fluid levels or advanced mucosal thickening. No mastoid effusion. Normal orbits. MRA HEAD FINDINGS Intracranial internal carotid arteries: Normal. Anterior cerebral arteries: Normal. Middle cerebral arteries: Normal. Posterior communicating arteries: Present on the right. Posterior cerebral arteries: Normal. Basilar artery: Normal. Vertebral arteries: Left dominant. Normal. Superior cerebellar arteries: Normal. Anterior inferior cerebellar arteries: Normal. Posterior inferior cerebellar arteries: Normal. MRA NECK FINDINGS Aortic arch: Normal 3 vessel aortic branching pattern. The visualized subclavian arteries are normal. Right carotid system: Normal course and caliber without stenosis or evidence of dissection. Left carotid system: Normal course and caliber without stenosis or evidence of dissection. Vertebral arteries: Left dominant. Vertebral artery origins are poorly visualized. Vertebral arteries are normal in course and caliber to the vertebrobasilar confluence without stenosis or evidence of dissection. IMPRESSION: 1. 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 2. Severe chronic ischemic  microangiopathy. 3. No emergent large vessel occlusion or high-grade intracranial stenosis. 4. No flow  limiting stenosis of the carotid or vertebral arteries. Electronically Signed   By: Ulyses Jarred M.D.   On: 12/16/2017 02:16   Mr Jodene Nam Head Wo Contrast  Result Date: 12/16/2017 CLINICAL DATA:  New onset aphasia and right-sided weakness EXAM: MR HEAD WITHOUT CONTRAST MR CIRCLE OF WILLIS WITHOUT CONTRAST MRA OF THE NECK WITHOUT AND WITH CONTRAST TECHNIQUE: Multiplanar, multiecho pulse sequences of the brain, circle of willis and surrounding structures were obtained without intravenous contrast. Angiographic images of the neck were obtained using MRA technique without and with intravenous contrast. CONTRAST:  55mL MULTIHANCE GADOBENATE DIMEGLUMINE 529 MG/ML IV SOLN COMPARISON:  Head CT 12/15/2017 FINDINGS: MRI HEAD FINDINGS Brain: The midline structures are normal. There is abnormal diffusion restriction in the left corpus striatum. No acute hemorrhage. There is severe periventricular, juxtacortical and deep white matter leukoaraiosis, most commonly seen in the setting of chronic ischemic microangiopathy. No mass lesion. No chronic microhemorrhage or cerebral amyloid angiopathy. No hydrocephalus, age advanced atrophy or lobar predominant volume loss. No dural abnormality or extra-axial collection. Skull and upper cervical spine: The visualized skull base, calvarium, upper cervical spine and extracranial soft tissues are normal. Sinuses/Orbits: No fluid levels or advanced mucosal thickening. No mastoid effusion. Normal orbits. MRA HEAD FINDINGS Intracranial internal carotid arteries: Normal. Anterior cerebral arteries: Normal. Middle cerebral arteries: Normal. Posterior communicating arteries: Present on the right. Posterior cerebral arteries: Normal. Basilar artery: Normal. Vertebral arteries: Left dominant. Normal. Superior cerebellar arteries: Normal. Anterior inferior cerebellar arteries: Normal. Posterior  inferior cerebellar arteries: Normal. MRA NECK FINDINGS Aortic arch: Normal 3 vessel aortic branching pattern. The visualized subclavian arteries are normal. Right carotid system: Normal course and caliber without stenosis or evidence of dissection. Left carotid system: Normal course and caliber without stenosis or evidence of dissection. Vertebral arteries: Left dominant. Vertebral artery origins are poorly visualized. Vertebral arteries are normal in course and caliber to the vertebrobasilar confluence without stenosis or evidence of dissection. IMPRESSION: 1. 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 2. Severe chronic ischemic microangiopathy. 3. No emergent large vessel occlusion or high-grade intracranial stenosis. 4. No flow limiting stenosis of the carotid or vertebral arteries. Electronically Signed   By: Ulyses Jarred M.D.   On: 12/16/2017 02:16   Ct Head Code Stroke Wo Contrast  Result Date: 12/15/2017 CLINICAL DATA:  Code stroke. Right-sided deficit, confusion, aphasia EXAM: CT HEAD WITHOUT CONTRAST TECHNIQUE: Contiguous axial images were obtained from the base of the skull through the vertex without intravenous contrast. COMPARISON:  None. FINDINGS: Brain: Mild to moderate atrophy. Moderate to advanced chronic appearing white matter changes diffusely. Negative for acute cortical infarct. Negative for hemorrhage or mass. No midline shift. Negative for hydrocephalus Vascular: Negative for hyperdense vessel Skull: Negative Sinuses/Orbits: Paranasal sinuses clear. Bilateral cataract removal. Other: None ASPECTS ([place] Stroke Program Early CT Score) - Ganglionic level infarction (caudate, lentiform nuclei, internal capsule, insula, M1-M3 cortex): 7 - Supraganglionic infarction (M4-M6 cortex): 3 Total score (0-10 with 10 being normal): 10 IMPRESSION: 1. No acute intracranial abnormality 2. ASPECTS is 10 3. Atrophy and moderate to advanced  chronic microvascular ischemia in the white matter. Electronically Signed   By: Franchot Gallo M.D.   On: 12/15/2017 20:55     Labs:   Basic Metabolic Panel: Recent Labs  Lab 12/15/17 2029 12/15/17 2105  NA 142 145  K 3.6 3.6  CL 108 103  CO2 23  --   GLUCOSE 111* 107*  BUN 23* 24*  CREATININE  0.84 0.90  CALCIUM 8.5*  --    GFR Estimated Creatinine Clearance: 48.1 mL/min (by C-G formula based on SCr of 0.9 mg/dL). Liver Function Tests: Recent Labs  Lab 12/15/17 2029  AST 15  ALT 9*  ALKPHOS 104  BILITOT 0.4  PROT 6.0*  ALBUMIN 3.7   No results for input(s): LIPASE, AMYLASE in the last 168 hours. No results for input(s): AMMONIA in the last 168 hours. Coagulation profile Recent Labs  Lab 12/15/17 2029  INR 1.05    CBC: Recent Labs  Lab 12/15/17 2029 12/15/17 2105  WBC 5.1  --   NEUTROABS 3.2  --   HGB 13.2 11.9*  HCT 38.7 35.0*  MCV 89.8  --   PLT 206  --    Cardiac Enzymes: No results for input(s): CKTOTAL, CKMB, CKMBINDEX, TROPONINI in the last 168 hours. BNP: Invalid input(s): POCBNP CBG: Recent Labs  Lab 12/15/17 2036  GLUCAP 102*   D-Dimer No results for input(s): DDIMER in the last 72 hours. Hgb A1c Recent Labs    12/16/17 0446  HGBA1C 5.1   Lipid Profile Recent Labs    12/16/17 0446  CHOL 176  HDL 41  LDLCALC 124*  TRIG 57  CHOLHDL 4.3   Thyroid function studies No results for input(s): TSH, T4TOTAL, T3FREE, THYROIDAB in the last 72 hours.  Invalid input(s): FREET3 Anemia work up No results for input(s): VITAMINB12, FOLATE, FERRITIN, TIBC, IRON, RETICCTPCT in the last 72 hours. Microbiology No results found for this or any previous visit (from the past 240 hour(s)).   Discharge Instructions:   Discharge Instructions    Diet - low sodium heart healthy   Complete by:  As directed    Increase activity slowly   Complete by:  As directed      Allergies as of 12/17/2017      Reactions   Penicillins Nausea And  Vomiting   Has patient had a PCN reaction causing immediate rash, facial/tongue/throat swelling, SOB or lightheadedness with hypotension: no Has patient had a PCN reaction causing severe rash involving mucus membranes or skin necrosis:no Has patient had a PCN reaction that required hospitalization no Has patient had a PCN reaction occurring within the last 10 years: no If all of the above answers are "NO", then may proceed with Cephalosporin use.      Medication List    STOP taking these medications   ibuprofen 200 MG tablet Commonly known as:  ADVIL,MOTRIN     TAKE these medications   aspirin 325 MG tablet Take 1 tablet (325 mg total) by mouth daily. Start taking on:  12/18/2017   atorvastatin 80 MG tablet Commonly known as:  LIPITOR Take 1 tablet (80 mg total) by mouth daily at 6 PM.   brinzolamide 1 % ophthalmic suspension Commonly known as:  AZOPT Place 1 drop into both eyes 2 (two) times daily.   cefUROXime 500 MG tablet Commonly known as:  CEFTIN Take 1 tablet (500 mg total) by mouth 2 (two) times daily for 5 days.   multivitamin with minerals Tabs tablet Take 1 tablet by mouth daily.   senna-docusate 8.6-50 MG tablet Commonly known as:  Senokot-S Take 1 tablet by mouth at bedtime as needed for mild constipation.   VITAMIN B-12 PO Take 1 tablet by mouth daily.   VYZULTA 0.024 % Soln Generic drug:  Latanoprostene Bunod Place 1 drop into both eyes at bedtime.      Follow-up Information    Briscoe Deutscher, MD Follow  up.   Specialty:  Family Medicine Contact information: [address] [place] [address] [phone]            Time coordinating discharge: 35 min  Signed:  Geradine Girt   [facility] 12/17/2017, 12:46 PM

## 2017-12-17 NOTE — H&P (Signed)
Physical Medicine and Rehabilitation Admission H&P    Chief Complaint  Patient presents with  . Code Stroke  : HPI: [name] is a 72 y.o. right handed female with history of endometrial cancer status post hysterectomy, tobacco abuse. Per chart review, patient, and daughter, patient lives with daughter and grandson. 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 Two-level home with bedroom on main and 2 steps to entry. Presented 12/15/2017 with right-sided weakness with facial droop and aphasia as well as general malaise. Cranial CT reviewed, unremarkable for acute process. Patient did not receive TPA. 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 MRA with no emergent large vessel occlusion or stenosis. Echocardiogram with ejection fraction of 40% grade 1 diastolic dysfunction. Neurology consulted presently on aspirin for CVA prophylaxis. Subcutaneous Lovenox for DVT prophylaxis. Tolerating a regular diet. Presently on Rocephin empirically for suspect UTI. Physical and occupational therapy evaluations completed with recommendations of physical medicine rehabilitation consult. Patient was admitted for a comprehensive rehabilitation program    Review of Systems  Constitutional: Negative for chills and fever.  HENT: Negative for hearing loss.   Eyes: Negative for blurred vision and double vision.  Respiratory: Positive for cough. Negative for shortness of breath.   Cardiovascular: Negative for chest pain, palpitations and leg swelling.  Gastrointestinal: Positive for constipation. Negative for nausea and vomiting.  Genitourinary: Negative for dysuria, flank pain and hematuria.  Musculoskeletal: Positive for myalgias.  Skin: Negative for rash.  Neurological: Positive for speech change and focal weakness. Negative for seizures.  All other systems reviewed and are negative.  Past Medical History:    Diagnosis Date  . Chronic cholecystitis   . Complication of anesthesia   . Diverticulosis   . Endometrial cancer ([place]) 1977   s/p TAH  . History of transfusion 1970's  . PONV (postoperative nausea and vomiting)    Past Surgical History:  Procedure Laterality Date  . ABDOMINAL HYSTERECTOMY  1977   for endometrial cancer  . HIP ARTHROPLASTY Left 11/18/2016   Procedure: ARTHROPLASTY BIPOLAR HIP (HEMIARTHROPLASTY);  Surgeon: Nicholes Stairs, MD;  Location: [facility];  Service: Orthopedics;  Laterality: Left;  . LAPAROSCOPIC CHOLECYSTECTOMY SINGLE SITE WITH INTRAOPERATIVE CHOLANGIOGRAM N/A 07/19/2015   Procedure: LAPAROSCOPIC CHOLECYSTECTOMY SINGLE SITE WITH INTRAOPERATIVE CHOLANGIOGRAM;  Surgeon: Michael Boston, MD;  Location: [facility];  Service: General;  Laterality: N/A;  . TONSILLECTOMY    . TUBAL LIGATION     Family History  Problem Relation Age of Onset  . Obesity Daughter    Social History:  reports that she has been smoking.  she has never used smokeless tobacco. She reports that she does not drink alcohol or use drugs. Allergies:  Allergies  Allergen Reactions  . Penicillins Nausea And Vomiting    Has patient had a PCN reaction causing immediate rash, facial/tongue/throat swelling, SOB or lightheadedness with hypotension: no Has patient had a PCN reaction causing severe rash involving mucus membranes or skin necrosis:no Has patient had a PCN reaction that required hospitalization no Has patient had a PCN reaction occurring within the last 10 years: no If all of the above answers are "NO", then may proceed with Cephalosporin use.   Medications Prior to Admission  Medication Sig Dispense Refill  . brinzolamide (AZOPT) 1 % ophthalmic suspension Place 1 drop into both eyes 2 (two) times daily.    . Cyanocobalamin (VITAMIN B-12 PO) Take 1 tablet by mouth daily.    Marland Kitchen  ibuprofen (ADVIL,MOTRIN) 200 MG tablet Take 400-600 mg by mouth every 6 (six) hours as needed (pain).    .  Latanoprostene Bunod (VYZULTA) 0.024 % SOLN Place 1 drop into both eyes at bedtime.    . Multiple Vitamin (MULTIVITAMIN WITH MINERALS) TABS tablet Take 1 tablet by mouth daily.      Drug Regimen Review Drug regimen was reviewed and remains appropriate with no significant issues identified  Home: Home Living Family/patient expects to be discharged to:: Private residence Living Arrangements: Other relatives Available Help at Discharge: Family, Available 24 hours/day Type of Home: House Home Access: Stairs to enter [employer] of Steps: 2 Entrance Stairs-Rails: Left Home Layout: Two level Alternate Level Stairs-Number of Steps: 13 Alternate Level Stairs-Rails: Left Bathroom Shower/Tub: Multimedia programmer: Standard Bathroom Accessibility: Yes Home Equipment: Environmental consultant - 2 wheels, Bedside commode Additional Comments: Home living and prior function collected from daughters   Functional History: Prior Function Level of Independence: Independent Comments: independent in ADLs, IADLs, works 2 jobs Librarian, academic), drives   Functional Status:  Mobility: Bed Mobility Overal bed mobility: Needs Assistance Bed Mobility: Supine to Sit Supine to sit: Min assist General bed mobility comments: minA for management of R LE into bed Transfers Overall transfer level: Needs assistance Equipment used: 1 person hand held assist, Rolling walker (2 wheeled) Transfers: Sit to/from Stand, [employer] Transfers Sit to Stand: Min assist Stand pivot transfers: Mod assist General transfer comment: modA for stand pivot to/from [facility] for powerup and stedying, decreased ability to move R LE. minA for powerup to RW with sit>stand,.vc for hand placement for power up Ambulation/Gait Ambulation/Gait assistance: Mod assist Ambulation Distance (Feet): 40 Feet Assistive device: Rolling walker (2 wheeled) Gait Pattern/deviations: Decreased step length - right, Decreased stance time - right,  Decreased weight shift to left, Shuffle, Narrow base of support, Staggering right General Gait Details: modA for steadying with RW and correcting R lateral lean, verbal cues and minA for weightshift to L to advance R LE, no buckling of R knee with weightbearing, pt states she is aware she is leaning R but can not correct Gait velocity: slowed Gait velocity interpretation: Below normal speed for age/gender    ADL: ADL Overall ADL's : Needs assistance/impaired Eating/Feeding: Minimal assistance, Sitting Grooming: Maximal assistance, Standing, Oral care, Wash/dry hands, Wash/dry face Grooming Details (indicate cue type and reason): Max A for standing balance with significant R lateral lean. Pt requiring Max cues to recognize her was leaning and then required Max A for correct balance. Pt requiring cues during grooming to initiate and sequence tasks. Pt demosntrating poor attention, processing, problem solving, and awarness. Upper Body Bathing: Moderate assistance, Sitting Lower Body Bathing: Maximal assistance, Sit to/from stand Upper Body Dressing : Moderate assistance, Sitting Lower Body Dressing: Maximal assistance, Sit to/from stand Toilet Transfer: Moderate assistance, Ambulation, RW, Regular Toilet, Maximal assistance Toilet Transfer Details (indicate cue type and reason): Mod A to power up into standing. Max A for R lateral lean in standing.  Toileting- Clothing Manipulation and Hygiene: Maximal assistance, Sit to/from stand, Cueing for sequencing, Cueing for safety Toileting - Clothing Manipulation Details (indicate cue type and reason): Pt requiring Max A for standing balance during toielt hygiene and pulling up underwear due to signficant R lateral lean. Functional mobility during ADLs: Moderate assistance, Rolling walker(Bumping into wall and object on R side) General ADL Comments: Pt dmeonstrating decreased funcitonal perform and poor awarness of deficits and safety. Pt presenting with  poor grasp strength, FM skills, and  funcitonal use of RUE (dominant hand). Pt with decreased cognition dmeonstrating poor attention, problem solving, and awarness. Pt requiring Max cues throughout session for performance of ADLs.  Cognition: Cognition Overall Cognitive Status: Impaired/Different from baseline Orientation Level: Oriented X4 Cognition Arousal/Alertness: Awake/alert Behavior During Therapy: Flat affect Overall Cognitive Status: Impaired/Different from baseline Area of Impairment: Attention, Memory, Following commands, Safety/judgement, Problem solving, Awareness Current Attention Level: Sustained Memory: Decreased short-term memory Following Commands: Follows one step commands inconsistently, Follows one step commands with increased time Safety/Judgement: Decreased awareness of safety, Decreased awareness of deficits Awareness: Emergent Problem Solving: Slow processing, Decreased initiation, Difficulty sequencing, Requires verbal cues, Requires tactile cues General Comments: Pt demonstratign poor cognition. Pt unable to maintain attention and required quite environment to perform ADLs. Poor initation of tasks and required verbal and visual cues. Decreased problem sovling during ADL tasks. Required increased time and cues throughout session. Difficult to assess due to: Level of arousal  Physical Exam: Blood pressure (!) 169/57, pulse (!) 55, temperature 98.5 F (36.9 C), temperature source Oral, resp. rate 18, height 5' 4"  (1.626 m), weight 61.4 kg (135 lb 5.8 oz), SpO2 94 %. Physical Exam  Vitals reviewed. HENT:  Right facial droop  Eyes: EOM are normal. Right eye exhibits no discharge. Left eye exhibits no discharge.  Neck: Normal range of motion. Neck supple. No thyromegaly present.  Cardiovascular: Normal rate, regular rhythm and normal heart sounds. Exam reveals no friction rub.  No murmur heard. Respiratory: Effort normal and breath sounds normal. No respiratory  distress.  GI: Soft. Bowel sounds are normal. She exhibits no distension. There is no tenderness.  Skin. Warm and dry Musculoskeletal. No edema and nontender Neurological: Patient is generally alert.  Able to follow 1 step commands.  Demonstrates word finding deficits.  Left upper extremity left lower extremity grossly 5 out of 5 proximal to distal.  Right upper extremity 4 out of 5 with fine motor movements decreased.  Right pronator drift noted.  Right lower extremity grossly 4- proximal to 5 out of 5 distally.  Does sense pain on the right side.  But generally cooperative Psych: Patient flat      Results for orders placed or performed during the hospital encounter of 12/15/17 (from the past 48 hour(s))  Protime-INR     Status: None   Collection Time: 12/15/17  8:29 PM  Result Value Ref Range   Prothrombin Time 13.6 11.4 - 15.2 seconds   INR 1.05   APTT     Status: None   Collection Time: 12/15/17  8:29 PM  Result Value Ref Range   aPTT 30 24 - 36 seconds  CBC     Status: None   Collection Time: 12/15/17  8:29 PM  Result Value Ref Range   WBC 5.1 4.0 - 10.5 K/uL   RBC 4.31 3.87 - 5.11 MIL/uL   Hemoglobin 13.2 12.0 - 15.0 g/dL   HCT 38.7 36.0 - 46.0 %   MCV 89.8 78.0 - 100.0 fL   MCH 30.6 26.0 - 34.0 pg   MCHC 34.1 30.0 - 36.0 g/dL   RDW 12.8 11.5 - 15.5 %   Platelets 206 150 - 400 K/uL  Differential     Status: None   Collection Time: 12/15/17  8:29 PM  Result Value Ref Range   Neutrophils Relative % 61 %   Neutro Abs 3.2 1.7 - 7.7 K/uL   Lymphocytes Relative 25 %   Lymphs Abs 1.3 0.7 - 4.0 K/uL   Monocytes  Relative 10 %   Monocytes Absolute 0.5 0.1 - 1.0 K/uL   Eosinophils Relative 3 %   Eosinophils Absolute 0.1 0.0 - 0.7 K/uL   Basophils Relative 1 %   Basophils Absolute 0.0 0.0 - 0.1 K/uL  Comprehensive metabolic panel     Status: Abnormal   Collection Time: 12/15/17  8:29 PM  Result Value Ref Range   Sodium 142 135 - 145 mmol/L   Potassium 3.6 3.5 - 5.1 mmol/L     Chloride 108 101 - 111 mmol/L   CO2 23 22 - 32 mmol/L   Glucose, Bld 111 (H) 65 - 99 mg/dL   BUN 23 (H) 6 - 20 mg/dL   Creatinine, Ser 0.84 0.44 - 1.00 mg/dL   Calcium 8.5 (L) 8.9 - 10.3 mg/dL   Total Protein 6.0 (L) 6.5 - 8.1 g/dL   Albumin 3.7 3.5 - 5.0 g/dL   AST 15 15 - 41 U/L   ALT 9 (L) 14 - 54 U/L   Alkaline Phosphatase 104 38 - 126 U/L   Total Bilirubin 0.4 0.3 - 1.2 mg/dL   GFR calc non Af Amer >60 >60 mL/min   GFR calc Af Amer >60 >60 mL/min    Comment: (NOTE) The eGFR has been calculated using the CKD EPI equation. This calculation has not been validated in all clinical situations. eGFR's persistently <60 mL/min signify possible Chronic Kidney Disease.    Anion gap 11 5 - 15  CBG monitoring, ED     Status: Abnormal   Collection Time: 12/15/17  8:36 PM  Result Value Ref Range   Glucose-Capillary 102 (H) 65 - 99 mg/dL   Comment 1 Notify RN    Comment 2 Document in Chart   I-stat troponin, ED     Status: None   Collection Time: 12/15/17  9:03 PM  Result Value Ref Range   Troponin i, poc 0.00 0.00 - 0.08 ng/mL   Comment 3            Comment: Due to the release kinetics of cTnI, a negative result within the first hours of the onset of symptoms does not rule out myocardial infarction with certainty. If myocardial infarction is still suspected, repeat the test at appropriate intervals.   I-Stat Chem 8, ED     Status: Abnormal   Collection Time: 12/15/17  9:05 PM  Result Value Ref Range   Sodium 145 135 - 145 mmol/L   Potassium 3.6 3.5 - 5.1 mmol/L   Chloride 103 101 - 111 mmol/L   BUN 24 (H) 6 - 20 mg/dL   Creatinine, Ser 0.90 0.44 - 1.00 mg/dL   Glucose, Bld 107 (H) 65 - 99 mg/dL   Calcium, Ion 1.16 1.15 - 1.40 mmol/L   TCO2 28 22 - 32 mmol/L   Hemoglobin 11.9 (L) 12.0 - 15.0 g/dL   HCT 35.0 (L) 36.0 - 46.0 %  Hemoglobin A1c     Status: None   Collection Time: 12/16/17  4:46 AM  Result Value Ref Range   Hgb A1c MFr Bld 5.1 4.8 - 5.6 %    Comment:  (NOTE) Pre diabetes:          5.7%-6.4% Diabetes:              >6.4% Glycemic control for   <7.0% adults with diabetes    Mean Plasma Glucose 99.67 mg/dL  Lipid panel     Status: Abnormal   Collection Time: 12/16/17  4:46 AM  Result  Value Ref Range   Cholesterol 176 0 - 200 mg/dL   Triglycerides 57 <150 mg/dL   HDL 41 >40 mg/dL   Total CHOL/HDL Ratio 4.3 RATIO   VLDL 11 0 - 40 mg/dL   LDL Cholesterol 124 (H) 0 - 99 mg/dL    Comment:        Total Cholesterol/HDL:CHD Risk Coronary Heart Disease Risk Table                     Men   Women  1/2 Average Risk   3.4   3.3  Average Risk       5.0   4.4  2 X Average Risk   9.6   7.1  3 X Average Risk  23.4   11.0        Use the calculated Patient Ratio above and the CHD Risk Table to determine the patient's CHD Risk.        ATP III CLASSIFICATION (LDL):  <100     mg/dL   Optimal  100-129  mg/dL   Near or Above                    Optimal  130-159  mg/dL   Borderline  160-189  mg/dL   High  >190     mg/dL   Very High   Urinalysis, Routine w reflex microscopic     Status: Abnormal   Collection Time: 12/16/17  6:31 PM  Result Value Ref Range   Color, Urine YELLOW YELLOW   APPearance HAZY (A) CLEAR   Specific Gravity, Urine 1.013 1.005 - 1.030   pH 7.0 5.0 - 8.0   Glucose, UA NEGATIVE NEGATIVE mg/dL   Hgb urine dipstick SMALL (A) NEGATIVE   Bilirubin Urine NEGATIVE NEGATIVE   Ketones, ur NEGATIVE NEGATIVE mg/dL   Protein, ur NEGATIVE NEGATIVE mg/dL   Nitrite NEGATIVE NEGATIVE   Leukocytes, UA NEGATIVE NEGATIVE   RBC / HPF 0-5 0 - 5 RBC/hpf   WBC, UA 6-30 0 - 5 WBC/hpf   Bacteria, UA MANY (A) NONE SEEN   Squamous Epithelial / LPF 0-5 (A) NONE SEEN   Mucus PRESENT    Mr Jodene Nam Neck W Wo Contrast  Result Date: 12/16/2017 CLINICAL DATA:  New onset aphasia and right-sided weakness EXAM: MR HEAD WITHOUT CONTRAST MR CIRCLE OF WILLIS WITHOUT CONTRAST MRA OF THE NECK WITHOUT AND WITH CONTRAST TECHNIQUE: Multiplanar, multiecho pulse  sequences of the brain, circle of willis and surrounding structures were obtained without intravenous contrast. Angiographic images of the neck were obtained using MRA technique without and with intravenous contrast. CONTRAST:  18m MULTIHANCE GADOBENATE DIMEGLUMINE 529 MG/ML IV SOLN COMPARISON:  Head CT 12/15/2017 FINDINGS: MRI HEAD FINDINGS Brain: The midline structures are normal. There is abnormal diffusion restriction in the left corpus striatum. No acute hemorrhage. There is severe periventricular, juxtacortical and deep white matter leukoaraiosis, most commonly seen in the setting of chronic ischemic microangiopathy. No mass lesion. No chronic microhemorrhage or cerebral amyloid angiopathy. No hydrocephalus, age advanced atrophy or lobar predominant volume loss. No dural abnormality or extra-axial collection. Skull and upper cervical spine: The visualized skull base, calvarium, upper cervical spine and extracranial soft tissues are normal. Sinuses/Orbits: No fluid levels or advanced mucosal thickening. No mastoid effusion. Normal orbits. MRA HEAD FINDINGS Intracranial internal carotid arteries: Normal. Anterior cerebral arteries: Normal. Middle cerebral arteries: Normal. Posterior communicating arteries: Present on the right. Posterior cerebral arteries: Normal. Basilar artery: Normal. Vertebral arteries: Left  dominant. Normal. Superior cerebellar arteries: Normal. Anterior inferior cerebellar arteries: Normal. Posterior inferior cerebellar arteries: Normal. MRA NECK FINDINGS Aortic arch: Normal 3 vessel aortic branching pattern. The visualized subclavian arteries are normal. Right carotid system: Normal course and caliber without stenosis or evidence of dissection. Left carotid system: Normal course and caliber without stenosis or evidence of dissection. Vertebral arteries: Left dominant. Vertebral artery origins are poorly visualized. Vertebral arteries are normal in course and caliber to the  vertebrobasilar confluence without stenosis or evidence of dissection. IMPRESSION: 1. 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 2. Severe chronic ischemic microangiopathy. 3. No emergent large vessel occlusion or high-grade intracranial stenosis. 4. No flow limiting stenosis of the carotid or vertebral arteries. Electronically Signed   By: Ulyses Jarred M.D.   On: 12/16/2017 02:16   Mr Brain Wo Contrast  Result Date: 12/16/2017 CLINICAL DATA:  New onset aphasia and right-sided weakness EXAM: MR HEAD WITHOUT CONTRAST MR CIRCLE OF WILLIS WITHOUT CONTRAST MRA OF THE NECK WITHOUT AND WITH CONTRAST TECHNIQUE: Multiplanar, multiecho pulse sequences of the brain, circle of willis and surrounding structures were obtained without intravenous contrast. Angiographic images of the neck were obtained using MRA technique without and with intravenous contrast. CONTRAST:  58m MULTIHANCE GADOBENATE DIMEGLUMINE 529 MG/ML IV SOLN COMPARISON:  Head CT 12/15/2017 FINDINGS: MRI HEAD FINDINGS Brain: The midline structures are normal. There is abnormal diffusion restriction in the left corpus striatum. No acute hemorrhage. There is severe periventricular, juxtacortical and deep white matter leukoaraiosis, most commonly seen in the setting of chronic ischemic microangiopathy. No mass lesion. No chronic microhemorrhage or cerebral amyloid angiopathy. No hydrocephalus, age advanced atrophy or lobar predominant volume loss. No dural abnormality or extra-axial collection. Skull and upper cervical spine: The visualized skull base, calvarium, upper cervical spine and extracranial soft tissues are normal. Sinuses/Orbits: No fluid levels or advanced mucosal thickening. No mastoid effusion. Normal orbits. MRA HEAD FINDINGS Intracranial internal carotid arteries: Normal. Anterior cerebral arteries: Normal. Middle cerebral arteries: Normal. Posterior communicating arteries: Present  on the right. Posterior cerebral arteries: Normal. Basilar artery: Normal. Vertebral arteries: Left dominant. Normal. Superior cerebellar arteries: Normal. Anterior inferior cerebellar arteries: Normal. Posterior inferior cerebellar arteries: Normal. MRA NECK FINDINGS Aortic arch: Normal 3 vessel aortic branching pattern. The visualized subclavian arteries are normal. Right carotid system: Normal course and caliber without stenosis or evidence of dissection. Left carotid system: Normal course and caliber without stenosis or evidence of dissection. Vertebral arteries: Left dominant. Vertebral artery origins are poorly visualized. Vertebral arteries are normal in course and caliber to the vertebrobasilar confluence without stenosis or evidence of dissection. IMPRESSION: 1. 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 2. Severe chronic ischemic microangiopathy. 3. No emergent large vessel occlusion or high-grade intracranial stenosis. 4. No flow limiting stenosis of the carotid or vertebral arteries. Electronically Signed   By: KUlyses JarredM.D.   On: 12/16/2017 02:16   Mr MJodene NamHead Wo Contrast  Result Date: 12/16/2017 CLINICAL DATA:  New onset aphasia and right-sided weakness EXAM: MR HEAD WITHOUT CONTRAST MR CIRCLE OF WILLIS WITHOUT CONTRAST MRA OF THE NECK WITHOUT AND WITH CONTRAST TECHNIQUE: Multiplanar, multiecho pulse sequences of the brain, circle of willis and surrounding structures were obtained without intravenous contrast. Angiographic images of the neck were obtained using MRA technique without and with intravenous contrast. CONTRAST:  172mMULTIHANCE GADOBENATE DIMEGLUMINE 529 MG/ML IV SOLN COMPARISON:  Head CT 12/15/2017 FINDINGS: MRI HEAD  FINDINGS Brain: The midline structures are normal. There is abnormal diffusion restriction in the left corpus striatum. No acute hemorrhage. There is severe periventricular, juxtacortical and deep white  matter leukoaraiosis, most commonly seen in the setting of chronic ischemic microangiopathy. No mass lesion. No chronic microhemorrhage or cerebral amyloid angiopathy. No hydrocephalus, age advanced atrophy or lobar predominant volume loss. No dural abnormality or extra-axial collection. Skull and upper cervical spine: The visualized skull base, calvarium, upper cervical spine and extracranial soft tissues are normal. Sinuses/Orbits: No fluid levels or advanced mucosal thickening. No mastoid effusion. Normal orbits. MRA HEAD FINDINGS Intracranial internal carotid arteries: Normal. Anterior cerebral arteries: Normal. Middle cerebral arteries: Normal. Posterior communicating arteries: Present on the right. Posterior cerebral arteries: Normal. Basilar artery: Normal. Vertebral arteries: Left dominant. Normal. Superior cerebellar arteries: Normal. Anterior inferior cerebellar arteries: Normal. Posterior inferior cerebellar arteries: Normal. MRA NECK FINDINGS Aortic arch: Normal 3 vessel aortic branching pattern. The visualized subclavian arteries are normal. Right carotid system: Normal course and caliber without stenosis or evidence of dissection. Left carotid system: Normal course and caliber without stenosis or evidence of dissection. Vertebral arteries: Left dominant. Vertebral artery origins are poorly visualized. Vertebral arteries are normal in course and caliber to the vertebrobasilar confluence without stenosis or evidence of dissection. IMPRESSION: 1. 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 2. Severe chronic ischemic microangiopathy. 3. No emergent large vessel occlusion or high-grade intracranial stenosis. 4. No flow limiting stenosis of the carotid or vertebral arteries. Electronically Signed   By: Ulyses Jarred M.D.   On: 12/16/2017 02:16   Ct Head Code Stroke Wo Contrast  Result Date: 12/15/2017 CLINICAL DATA:  Code stroke. Right-sided  deficit, confusion, aphasia EXAM: CT HEAD WITHOUT CONTRAST TECHNIQUE: Contiguous axial images were obtained from the base of the skull through the vertex without intravenous contrast. COMPARISON:  None. FINDINGS: Brain: Mild to moderate atrophy. Moderate to advanced chronic appearing white matter changes diffusely. Negative for acute cortical infarct. Negative for hemorrhage or mass. No midline shift. Negative for hydrocephalus Vascular: Negative for hyperdense vessel Skull: Negative Sinuses/Orbits: Paranasal sinuses clear. Bilateral cataract removal. Other: None ASPECTS ([place] Stroke Program Early CT Score) - Ganglionic level infarction (caudate, lentiform nuclei, internal capsule, insula, M1-M3 cortex): 7 - Supraganglionic infarction (M4-M6 cortex): 3 Total score (0-10 with 10 being normal): 10 IMPRESSION: 1. No acute intracranial abnormality 2. ASPECTS is 10 3. Atrophy and moderate to advanced chronic microvascular ischemia in the white matter. Electronically Signed   By: Franchot Gallo M.D.   On: 12/15/2017 20:55       Medical Problem List and Plan: 1.  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  DVT Prophylaxis/Anticoagulation: Subcutaneous Lovenox. Monitor platelet counts in any signs of bleeding 3. Pain Management: Tylenol as needed 4. Mood: Provide emotional support 5. Neuropsych: This patient is capable of making decisions on her own behalf. 6. Skin/Wound Care: Routine skin checks 7. Fluids/Electrolytes/Nutrition: Routine I&O's with follow-up chemistries 8. Hyperlipidemia. Lipitor 9. Tobacco abuse. Counseling 10. History of endometrial cancer status post hysterectomy     Post Admission Physician Evaluation: 1. Functional deficits secondary  to left basal ganglia/corpus stratum infarct. 2. Patient is admitted to receive collaborative, interdisciplinary care between the  physiatrist, rehab nursing staff, and therapy team. 3. Patient's level of medical complexity and substantial therapy needs in context of that medical necessity cannot  be provided at a lesser intensity of care such as a SNF. 4. Patient has experienced substantial functional loss from his/her baseline which was documented above under the "Functional History" and "Functional Status" headings.  Judging by the patient's diagnosis, physical exam, and functional history, the patient has potential for functional progress which will result in measurable gains while on inpatient rehab.  These gains will be of substantial and practical use upon discharge  in facilitating mobility and self-care at the household level. 5. Physiatrist will provide 24 hour management of medical needs as well as oversight of the therapy plan/treatment and provide guidance as appropriate regarding the interaction of the two. 6. The Preadmission Screening has been reviewed and patient status is unchanged unless otherwise stated above. 7. 24 hour rehab nursing will assist with bladder management, bowel management, safety, skin/wound care, disease management, medication administration and patient education  and help integrate therapy concepts, techniques,education, etc. 8. PT will assess and treat for/with: Lower extremity strength, range of motion, stamina, balance, functional mobility, safety, adaptive techniques and equipment, NMR   Goals are: Supervision to minimal assistance. 9. OT will assess and treat for/with: ADL's, functional mobility, safety, upper extremity strength, adaptive techniques and equipment .   Goals are: Supervision to minimal assistance. Therapy may proceed with showering this patient. 10. SLP will assess and treat for/with: Communication/speech/cognition.  Goals are: Modified independent. 11. Case Management and Social Worker will assess and treat for psychological issues and discharge planning. 12. Team conference  will be held weekly to assess progress toward goals and to determine barriers to discharge. 13. Patient will receive at least 3 hours of therapy per day at least 5 days per week. 14. ELOS: 13-17 days       15. Prognosis:  excellent     Meredith Staggers, MD, [facility] 12/17/2017  Lavon Paganini [place], PA-C 12/17/2017

## 2017-12-18 ENCOUNTER — Inpatient Hospital Stay (HOSPITAL_COMMUNITY): Payer: No Typology Code available for payment source

## 2017-12-18 ENCOUNTER — Inpatient Hospital Stay (HOSPITAL_COMMUNITY): Payer: Medicare Other | Admitting: Speech Pathology

## 2017-12-18 ENCOUNTER — Inpatient Hospital Stay (HOSPITAL_COMMUNITY): Payer: No Typology Code available for payment source | Admitting: Physical Therapy

## 2017-12-18 ENCOUNTER — Inpatient Hospital Stay (HOSPITAL_COMMUNITY): Payer: No Typology Code available for payment source | Admitting: Occupational Therapy

## 2017-12-18 DIAGNOSIS — I69320 Aphasia following cerebral infarction: Secondary | ICD-10-CM

## 2017-12-18 DIAGNOSIS — I69351 Hemiplegia and hemiparesis following cerebral infarction affecting right dominant side: Principal | ICD-10-CM

## 2017-12-18 LAB — COMPREHENSIVE METABOLIC PANEL
ALK PHOS: 92 U/L (ref 38–126)
ALT: 10 U/L — AB (ref 14–54)
ANION GAP: 9 (ref 5–15)
AST: 16 U/L (ref 15–41)
Albumin: 3.2 g/dL — ABNORMAL LOW (ref 3.5–5.0)
BILIRUBIN TOTAL: 0.6 mg/dL (ref 0.3–1.2)
BUN: 19 mg/dL (ref 6–20)
CALCIUM: 8.7 mg/dL — AB (ref 8.9–10.3)
CO2: 26 mmol/L (ref 22–32)
CREATININE: 0.63 mg/dL (ref 0.44–1.00)
Chloride: 107 mmol/L (ref 101–111)
GFR calc Af Amer: >60 mL/min (ref >60)
Glucose, Bld: 133 mg/dL — ABNORMAL HIGH (ref 65–99)
Potassium: 3.1 mmol/L — ABNORMAL LOW (ref 3.5–5.1)
Sodium: 142 mmol/L (ref 135–145)
TOTAL PROTEIN: 5.6 g/dL — AB (ref 6.5–8.1)

## 2017-12-18 LAB — CBC WITH DIFFERENTIAL/PLATELET
Basophils Absolute: 0 10*3/uL (ref 0.0–0.1)
Basophils Relative: 0 %
EOS ABS: 0.1 10*3/uL (ref 0.0–0.7)
Eosinophils Relative: 3 %
HCT: 36.9 % (ref 36.0–46.0)
HEMOGLOBIN: 12.5 g/dL (ref 12.0–15.0)
LYMPHS ABS: 1.1 10*3/uL (ref 0.7–4.0)
LYMPHS PCT: 25 %
MCH: 29.8 pg (ref 26.0–34.0)
MCHC: 33.9 g/dL (ref 30.0–36.0)
MCV: 87.9 fL (ref 78.0–100.0)
Monocytes Absolute: 0.6 10*3/uL (ref 0.1–1.0)
Monocytes Relative: 12 %
NEUTROS PCT: 60 %
Neutro Abs: 2.6 10*3/uL (ref 1.7–7.7)
Platelets: 204 10*3/uL (ref 150–400)
RBC: 4.2 MIL/uL (ref 3.87–5.11)
RDW: 12.5 % (ref 11.5–15.5)
WBC: 4.5 10*3/uL (ref 4.0–10.5)

## 2017-12-18 MED ORDER — LATANOPROST 0.005 % OP SOLN
1.0000 [drp] | Freq: Every day | OPHTHALMIC | Status: DC
  Administered 2017-12-18 – 2017-12-25 (×8): 1 [drp] via OPHTHALMIC
  Filled 2017-12-18: qty 2.5

## 2017-12-18 MED ORDER — POTASSIUM CHLORIDE CRYS ER 20 MEQ PO TBCR
20.0000 meq | EXTENDED_RELEASE_TABLET | Freq: Two times a day (BID) | ORAL | Status: AC
  Administered 2017-12-18 – 2017-12-19 (×4): 20 meq via ORAL
  Filled 2017-12-18 (×4): qty 1

## 2017-12-18 MED ORDER — BRINZOLAMIDE 1 % OP SUSP
1.0000 [drp] | Freq: Two times a day (BID) | OPHTHALMIC | Status: DC
  Administered 2017-12-18 – 2017-12-26 (×16): 1 [drp] via OPHTHALMIC
  Filled 2017-12-18: qty 10

## 2017-12-18 NOTE — Patient Care Conference (Signed)
Inpatient RehabilitationTeam Conference and Plan of Care Update Date: 12/18/2017   Time: 11:30 AM    Patient Name: [name]      Medical Record Number: [id number]  Date of Birth: Feb 16, 1946 Sex: Female         Room/Bed: 4W24C/4W24C-01 Payor Info: Payor: MEDICARE / Plan: MEDICARE PART A AND B / Product Type: *No Product type* /    Admitting Diagnosis: CVA  Admit Date/Time:  12/17/2017  6:37 PM Admission Comments: No comment available   Primary Diagnosis:  <principal problem not specified> Principal Problem: <principal problem not specified>  Patient Active Problem List   Diagnosis Date Noted  . Left basal ganglia embolic stroke ([place]) 96/22/2979  . History of endometrial cancer   . Chronic diastolic congestive heart failure ([place])   . Hypertension   . Acute blood loss anemia   . Pure hypercholesterolemia   . Acute lower UTI   . Right sided weakness 12/15/2017  . Aphasia 12/15/2017  . Acute CVA (cerebrovascular accident) ([place]) 12/15/2017  . Closed fracture of left hip ([place]) 11/18/2016  . Tobacco abuse   . Chronic cholecystitis s/p lap chole 07/19/2015 07/19/2015    Expected Discharge Date: Expected Discharge Date: 12/26/17  Team Members Present: Physician leading conference: Dr. Alysia Penna Social Worker Present: Ovidio Kin, LCSW Nurse Present: Junius Creamer, RN PT Present: Dwyane Dee, PT OT Present: Cherylynn Ridges, OT SLP Present: Weston Anna, SLP PPS Coordinator present : Daiva Nakayama, RN, CRRN     Current Status/Progress Goal Weekly Team Focus  Medical     monitoring potassium level   adjusting medications     Bowel/Bladder   continent  remain continent  continue to monitor & assist as needed   Swallow/Nutrition/ Hydration             ADL's   Min A overall  Supervision/mod I  R NMR, functional ambulation, standing/sitting balance, R lean, R attention,   Mobility   min assist overall  supervision  balance, attention, cognitive remediation,  funcitonal mobility progression    Communication   Eval Pending          Safety/Cognition/ Behavioral Observations  Eval Pending          Pain   no c/o pain, has tylenol prn  pain scale <4  assess & treat as needed   Skin   no skin break down  no new skin break down  assess q shift      *See Care Plan and progress notes for long and short-term goals.     Barriers to Discharge  Current Status/Progress Possible Resolutions Date Resolved   Physician                    Nursing                  PT  Decreased caregiver support                 OT                  SLP                SW                Discharge Planning/Teaching Needs:    Home with daughter-[name] and grandson assisting with her care.     Team Discussion:  Goals being set for supervision level. Being evaluated today. Poor po intake working on this. Leans to the  right. MD working on low potassium level-monitoring. Tends to perserevates at times.  Revisions to Treatment Plan:  New eval target discharge date 1/31       [name] 12/19/2017, 8:31 AM

## 2017-12-18 NOTE — Evaluation (Signed)
Speech Language Pathology Assessment and Plan  Patient Details  Name: [name] MRN: [id number] Date of Birth: 04/30/1946  SLP Diagnosis: Aphasia;Cognitive Impairments  Rehab Potential: Excellent ELOS: 1/31    Today's Date: 12/18/2017 SLP Individual Time: 1300-1400 SLP Individual Time Calculation (min): 60 min   Problem List:  Patient Active Problem List   Diagnosis Date Noted  . Left basal ganglia embolic stroke ([place]) 10/93/2355  . History of endometrial cancer   . Chronic diastolic congestive heart failure ([place])   . Hypertension   . Acute blood loss anemia   . Pure hypercholesterolemia   . Acute lower UTI   . Right sided weakness 12/15/2017  . Aphasia 12/15/2017  . Acute CVA (cerebrovascular accident) ([place]) 12/15/2017  . Closed fracture of left hip ([place]) 11/18/2016  . Tobacco abuse   . Chronic cholecystitis s/p lap chole 07/19/2015 07/19/2015   Past Medical History:  Past Medical History:  Diagnosis Date  . Chronic cholecystitis   . Complication of anesthesia   . Diverticulosis   . Endometrial cancer ([place]) 1977   s/p TAH  . History of transfusion 1970's  . PONV (postoperative nausea and vomiting)    Past Surgical History:  Past Surgical History:  Procedure Laterality Date  . ABDOMINAL HYSTERECTOMY  1977   for endometrial cancer  . HIP ARTHROPLASTY Left 11/18/2016   Procedure: ARTHROPLASTY BIPOLAR HIP (HEMIARTHROPLASTY);  Surgeon: Nicholes Stairs, MD;  Location: [facility];  Service: Orthopedics;  Laterality: Left;  . LAPAROSCOPIC CHOLECYSTECTOMY SINGLE SITE WITH INTRAOPERATIVE CHOLANGIOGRAM N/A 07/19/2015   Procedure: LAPAROSCOPIC CHOLECYSTECTOMY SINGLE SITE WITH INTRAOPERATIVE CHOLANGIOGRAM;  Surgeon: Michael Boston, MD;  Location: [facility];  Service: General;  Laterality: N/A;  . TONSILLECTOMY    . TUBAL LIGATION      Assessment / Plan / Recommendation Clinical Impression is a 72 y.o. right handed female with history of endometrial cancer status post  hysterectomy, tobacco abuse. Per chart review, patient, and daughter, patient lives with daughter and grandson. 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 Two-level home with bedroom on main and 2 steps to entry. Presented 12/15/2017 with right-sided weakness with facial droop and aphasia as well as general malaise. Cranial CT reviewed, unremarkable for acute process. Patient did not receive TPA. 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 MRA with no emergent large vessel occlusion or stenosis. Echocardiogram with ejection fraction of 73% grade 1 diastolic dysfunction. Neurology consulted presently on aspirin for CVA prophylaxis. Subcutaneous Lovenox for DVT prophylaxis. Tolerating a regular diet. Presently on Rocephin empirically for suspect UTI. Physical and occupational therapy evaluations completed with recommendations of physical medicine rehabilitation consult. Patient was admitted for a comprehensive rehabilitation program 12/17/17.  Patient demonstrates mild-moderate language impairments impacting word-finding, generative naming, verbal expression and auditory comprehension of of complex information. Patient also demonstrates moderate cognitive impairments impacting attention, recall, problem solving, executive functioning and visuospatial skills which impacts her ability to complete functional and familiar tasks safely. Patient would benefit from skilled SLP intervention to maximize her cognitive-linguistic function and overall functional independence prior to discharge.    Skilled Therapeutic Interventions          Administered a cognitive-linguistic evaluation. Please see above for details. Educated patient and her daughter in regards to her current cognitive-linguistic function and goals of skilled SLP intervention, all verbalized understanding.   SLP Assessment  Patient will need skilled Speech  [place] Pathology Services during CIR admission    Recommendations  Patient destination: Home Follow up Recommendations: Outpatient SLP;24 hour supervision/assistance Equipment Recommended: None recommended by SLP    SLP Frequency 3 to 5 out of 7 days   SLP Duration  SLP Intensity  SLP Treatment/Interventions 1/31  Minumum of 1-2 x/day, 30 to 90 minutes  Cognitive remediation/compensation;Environmental controls;Speech/Language facilitation;Therapeutic Activities;Patient/family education;Functional tasks;Cueing hierarchy    Pain No/Denies Pain  Function:   Cognition Comprehension Comprehension assist level: Understands basic 75 - 89% of the time/ requires cueing 10 - 24% of the time  Expression   Expression assist level: Expresses basic 75 - 89% of the time/requires cueing 10 - 24% of the time. Needs helper to occlude trach/needs to repeat words.  Social Interaction Social Interaction assist level: Interacts appropriately with others with medication or extra time (anti-anxiety, antidepressant).  Problem Solving Problem solving assist level: Solves basic 90% of the time/requires cueing < 10% of the time  Memory Memory assist level: Recognizes or recalls 75 - 89% of the time/requires cueing 10 - 24% of the time   Short Term Goals: Week 1: SLP Short Term Goal 1 (Week 1): STGs=LTGs due to short length of stay   Refer to Care Plan for Long Term Goals  Recommendations for other services: Neuropsych  Discharge Criteria: Patient will be discharged from SLP if patient refuses treatment 3 consecutive times without medical reason, if treatment goals not met, if there is a change in medical status, if patient makes no progress towards goals or if patient is discharged from hospital.  The above assessment, treatment plan, treatment alternatives and goals were discussed and mutually agreed upon: by patient and by family  Daekwon Beswick 12/18/2017, 4:31 PM

## 2017-12-18 NOTE — Progress Notes (Signed)
Subjective/Complaints:   Objective: Vital Signs: Blood pressure 136/72, pulse 94, temperature 97.7 F (36.5 C), temperature source Oral, resp. rate 18, height 5' 4"  (1.626 m), weight 60.8 kg (134 lb 0.6 oz), SpO2 95 %. No results found. Results for orders placed or performed during the hospital encounter of 12/17/17 (from the past 72 hour(s))  CBC     Status: None   Collection Time: 12/17/17  9:25 PM  Result Value Ref Range   WBC 5.2 4.0 - 10.5 K/uL   RBC 4.20 3.87 - 5.11 MIL/uL   Hemoglobin 12.6 12.0 - 15.0 g/dL   HCT 37.2 36.0 - 46.0 %   MCV 88.6 78.0 - 100.0 fL   MCH 30.0 26.0 - 34.0 pg   MCHC 33.9 30.0 - 36.0 g/dL   RDW 12.5 11.5 - 15.5 %   Platelets 218 150 - 400 K/uL  Creatinine, serum     Status: None   Collection Time: 12/17/17  9:25 PM  Result Value Ref Range   Creatinine, Ser 0.69 0.44 - 1.00 mg/dL   GFR calc non Af Amer >60 >60 mL/min   GFR calc Af Amer >60 >60 mL/min    Comment: (NOTE) The eGFR has been calculated using the CKD EPI equation. This calculation has not been validated in all clinical situations. eGFR's persistently <60 mL/min signify possible Chronic Kidney Disease.   CBC WITH DIFFERENTIAL     Status: None   Collection Time: 12/18/17  7:37 AM  Result Value Ref Range   WBC 4.5 4.0 - 10.5 K/uL   RBC 4.20 3.87 - 5.11 MIL/uL   Hemoglobin 12.5 12.0 - 15.0 g/dL   HCT 36.9 36.0 - 46.0 %   MCV 87.9 78.0 - 100.0 fL   MCH 29.8 26.0 - 34.0 pg   MCHC 33.9 30.0 - 36.0 g/dL   RDW 12.5 11.5 - 15.5 %   Platelets 204 150 - 400 K/uL   Neutrophils Relative % 60 %   Neutro Abs 2.6 1.7 - 7.7 K/uL   Lymphocytes Relative 25 %   Lymphs Abs 1.1 0.7 - 4.0 K/uL   Monocytes Relative 12 %   Monocytes Absolute 0.6 0.1 - 1.0 K/uL   Eosinophils Relative 3 %   Eosinophils Absolute 0.1 0.0 - 0.7 K/uL   Basophils Relative 0 %   Basophils Absolute 0.0 0.0 - 0.1 K/uL     HEENT: normal Cardio: RRR and No murmur Resp: CTA B/L and unlabored GI: BS positive and NT,  ND Extremity:  No Edema Skin:   Other IV site Left forearm CDI Neuro: Alert/Oriented, Abnormal Sensory difficult to assess secondary toaphasia , Abnormal Motor 3/5 R delt, bi, tri grip, 4/5 R HF, KE,ADF, 5/5 on left side, Abnormal FMC Ataxic/ dec FMC and Aphasic Musc/Skel:  Other no pain with ROM RUE or RLE Gen NAD   Assessment/Plan: 1. Functional deficits secondary to Right hemiparesis, aphasia  which require 3+ hours per day of interdisciplinary therapy in a comprehensive inpatient rehab setting. Physiatrist is providing close team supervision and 24 hour management of active medical problems listed below. Physiatrist and rehab team continue to assess barriers to discharge/monitor patient progress toward functional and medical goals. FIM:       Function - Toileting Toileting steps completed by patient: Adjust clothing prior to toileting, Performs perineal hygiene, Adjust clothing after toileting Toileting Assistive Devices: Grab bar or rail Assist level: Touching or steadying assistance (Pt.75%)  Function - Toilet Transfers Assist level to toilet: Touching or steadying  assistance (Pt > 75%) Assist level from toilet: Touching or steadying assistance (Pt > 75%)  Function - Chair/bed transfer Chair/bed transfer method: Ambulatory Chair/bed transfer assist level: Touching or steadying assistance (Pt > 75%) Chair/bed transfer assistive device: Bedrails     Function - Comprehension Comprehension: Auditory Comprehension assist level: Understands basic 50 - 74% of the time/ requires cueing 25 - 49% of the time  Function - Expression Expression: Verbal Expression assist level: Expresses basic 50 - 74% of the time/requires cueing 25 - 49% of the time. Needs to repeat parts of sentences.  Function - Social Interaction Social Interaction assist level: Interacts appropriately with others - No medications needed.  Function - Problem Solving Problem solving assist level: Solves basic  90% of the time/requires cueing < 10% of the time  Function - Memory Patient normally able to recall (first 3 days only): That he or she is in a hospital  Medical Problem List and Plan:  1. 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 DVT Prophylaxis/Anticoagulation: Subcutaneous Lovenox. Normal platelet counts and no signs of bleeding  3. Pain Management: Tylenol as needed  4. Mood: Provide emotional support  5. Neuropsych: This patient is capable of making decisions on her own behalf.  6. Skin/Wound Care: Routine skin checks  7. Fluids/Electrolytes/Nutrition: Routine I&O's with follow-up chemistries Not recorded thus far but RN reports 60% breakfast 8. Hyperlipidemia. Lipitor  9. Tobacco abuse. Counseling  10. History of endometrial cancer status post hysterectomy     LOS (Days) 1 A FACE TO FACE EVALUATION WAS PERFORMED  Charlett Blake 12/18/2017, 8:53 AM

## 2017-12-18 NOTE — Progress Notes (Signed)
Social Work Patient ID: [name], female   DOB: 1946-05-10, 72 y.o.   MRN: [id number]  Met with pt and daughter-Tammy to discuss team conference goals supervision level and target discharge date 1/31. Both are hopeful she will do well here and reach above these goals. Pt wants to regain her independence while here and before she goes home. Work on discharge needs, encouraged family to be here and attend therapies with pt.

## 2017-12-18 NOTE — Care Management Note (Signed)
Inpatient Rehabilitation Center Individual Statement of Services  Patient Name:  [name]  Date:  12/18/2017  Welcome to the [facility].  Our goal is to provide you with an individualized program based on your diagnosis and situation, designed to meet your specific needs.  With this comprehensive rehabilitation program, you will be expected to participate in at least 3 hours of rehabilitation therapies Monday-Friday, with modified therapy programming on the weekends.  Your rehabilitation program will include the following services:  Physical Therapy (PT), Occupational Therapy (OT), Speech Therapy (ST), 24 hour per day rehabilitation nursing, Case Management (Social Worker), Rehabilitation Medicine, Nutrition Services and Pharmacy Services  Weekly team conferences will be held on Wednesday to discuss your progress.  Your Social Worker will talk with you frequently to get your input and to update you on team discussions.  Team conferences with you and your family in attendance may also be held.  Expected length of stay: 7-10 days  Overall anticipated outcome: supervision level  Depending on your progress and recovery, your program may change. Your Social Worker will coordinate services and will keep you informed of any changes. Your Social Worker's name and contact numbers are listed  below.  The following services may also be recommended but are not provided by the [facility] will be made to provide these services after discharge if needed.  Arrangements include referral to agencies that provide these services.  Your insurance has been verified to be:  [place] primary doctor is:  Rikki Spearing  Pertinent information will be shared with your doctor and your insurance company.  Social  Worker:  Ovidio Kin, [place] or ([phone]  Information discussed with and copy given to patient by: [name], 12/18/2017, 1:55 PM

## 2017-12-18 NOTE — Progress Notes (Signed)
Patient information reviewed and entered into eRehab system by Marico Buckle, RN, CRRN, PPS Coordinator.  Information including medical coding and functional independence measure will be reviewed and updated through discharge.     Per nursing patient was given "Data Collection Information Summary for Patients in Inpatient Rehabilitation Facilities with attached "Privacy Act Statement-Health Care Records" upon admission.  

## 2017-12-18 NOTE — Progress Notes (Signed)
Social Work  Social Work Assessment and Plan  Patient Details  Name: [name] MRN: [id number] Date of Birth: 1946/06/09  Today's Date: 12/18/2017  Problem List:  Patient Active Problem List   Diagnosis Date Noted  . Left basal ganglia embolic stroke ([place]) 23/76/2831  . History of endometrial cancer   . Chronic diastolic congestive heart failure ([place])   . Hypertension   . Acute blood loss anemia   . Pure hypercholesterolemia   . Acute lower UTI   . Right sided weakness 12/15/2017  . Aphasia 12/15/2017  . Acute CVA (cerebrovascular accident) ([place]) 12/15/2017  . Closed fracture of left hip ([place]) 11/18/2016  . Tobacco abuse   . Chronic cholecystitis s/p lap chole 07/19/2015 07/19/2015   Past Medical History:  Past Medical History:  Diagnosis Date  . Chronic cholecystitis   . Complication of anesthesia   . Diverticulosis   . Endometrial cancer ([place]) 1977   s/p TAH  . History of transfusion 1970's  . PONV (postoperative nausea and vomiting)    Past Surgical History:  Past Surgical History:  Procedure Laterality Date  . ABDOMINAL HYSTERECTOMY  1977   for endometrial cancer  . HIP ARTHROPLASTY Left 11/18/2016   Procedure: ARTHROPLASTY BIPOLAR HIP (HEMIARTHROPLASTY);  Surgeon: Nicholes Stairs, MD;  Location: [facility];  Service: Orthopedics;  Laterality: Left;  . LAPAROSCOPIC CHOLECYSTECTOMY SINGLE SITE WITH INTRAOPERATIVE CHOLANGIOGRAM N/A 07/19/2015   Procedure: LAPAROSCOPIC CHOLECYSTECTOMY SINGLE SITE WITH INTRAOPERATIVE CHOLANGIOGRAM;  Surgeon: Michael Boston, MD;  Location: [facility];  Service: General;  Laterality: N/A;  . TONSILLECTOMY    . TUBAL LIGATION     Social History:  reports that she has been smoking.  She has a 15.00 pack-year smoking history. she has never used smokeless tobacco. She reports that she does not drink alcohol or use drugs.  Family / Support Systems Marital Status: Single Patient Roles: Parent, Other (Comment)(employee & grandparent) Children:  Tammy Bennett-daughter [phone]-cell Other Supports: Tonya-daughter [phone]-cell Anticipated Caregiver: Tammy and Brandon-grandson Ability/Limitations of Caregiver: No issues Caregiver Availability: 24/7 Family Dynamics: Close knit family pt has three daughter's who are all local and involved in her life. She has grown grandchildren whom she is also close with. Her grandson-Brandon lives with her. She has co-workers and freinds she has good supports.  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 Her daugher voiced: " My Mom is very independent and doesn't like this." Pt plans to be back to her indpendent level by the time she leaves here. Recent Psychosocial Issues: other health issues were manaaged by PCP Pyschiatric History: No history deferred depression screen due to able to verbalize her feelings and concerns. May benefit from seeing neuro-psych while here. Will ask team their input. Substance Abuse History: Tobacco aware needs to quit and being told to by family and medical team. She is planning  on trying to when she is discharged.  Patient / Family Perceptions, Expectations & Goals Pt/Family understanding of illness & functional  limitations: Pt and daughter are able to explain her stroke and deficits. They have spoken with the MD and feel they have an understanding of her treatment plan going forward. Pt is ready to start her rehab and do well here. Pt is not one to not ask questions. Premorbid pt/family roles/activities: Mom, employee, grandparent, home owner, church member, etc Anticipated changes in roles/activities/participation: resume Pt/family expectations/goals: Pt states: " I want to be able to be independent by the time I leave here." Daughter states: " I hope she does well because she does not want [place] to help her."  [employer]: Other (Comment)(after THR) Premorbid Home Care/DME Agencies: Other (Comment)(after THR) Transportation available at discharge: family  Resource referrals recommended: Support group (specify)  Discharge Planning Living Arrangements: Other relatives Support Systems: Children, Other relatives, Friends/neighbors, Church/faith community Type of Residence: Private residence Insurance Resources: [employer], Multimedia programmer (specify)([employer]) Financial Resources: Employment Financial Screen Referred: No Living Expenses: Own Money Management: Patient Does the patient have any problems obtaining your medications?: No Home Management: patient Patient/Family Preliminary Plans: Return home with Tammy and grandson is there if needed. Tammy reports she does not live with her but will be staying when she is discharged from the hospital until she can stay by herself. Made aware team will be meeting and setting goals for pt. Social Work Anticipated Follow Up Needs: HH/OP, Support Group  Clinical Impression Pleasant female who has always been very independent and worked two fulltime jobs. She realizes she will ned to slow down and may need to give up one of the jobs for her health sake. Her three daughter's are very involved and supportive. She will have 24 hr  care when first goes home via [place]. Will work on discharge needs and see if would benefit from seeing neuro-psych while here.  [name] 12/18/2017, 1:51 PM

## 2017-12-18 NOTE — Progress Notes (Signed)
PMR Admission Coordinator Pre-Admission Assessment  Patient: [name] is an 72 y.o., female MRN: [id number] DOB: 13-Nov-1946 Height: 5\' 4"  (162.6 cm) Weight: 61.4 kg (135 lb 5.8 oz)                                                                                                                                                  Insurance Information HMO:     PPO:      PCP:      IPA:      80/20:      OTHER:  PRIMARY: Medicare A & B      Policy#: [id number]      Subscriber: Self CM Name:       Phone#:      Fax#:  Pre-Cert#: Eligible       Employer: Full Time  Benefits:  Phone #: Verified online      Name: [employer] Portal  Eff. Date: 09/26/09     Deduct: $1364      Out of Pocket Max: N/A      Life Max: N/A CIR: 100%      SNF: 100% days 1-20; 80% days 21-100 Outpatient: 80%     Co-Pay: 20% Home Health: 100%      Co-Pay: $0 DME: 80%     Co-Pay: 20% Providers: Patient's choice   SECONDARY: [employer]       Policy#: [id number]      Subscriber: Self CM Name:       Phone#:      Fax#:  Pre-Cert#:       Employer: Full TIme Benefits:  Phone #: [phone]     Name:  Eff. Date:      Deduct:       Out of Pocket Max:       Life Max:  CIR:       SNF:  Outpatient:      Co-Pay:  Home Health:       Co-Pay:  DME:      Co-Pay:   Medicaid Application Date:       Case Manager:  Disability Application Date:       Case Worker:   Emergency Contact Information        Contact Information    Name Relation Home Work Mobile   [employer] Daughter   [phone]     Current Medical History  Patient Admitting Diagnosis: left basal ganglia corpus striatuminfarct  History of Present Illness: Kyeshia Crossis a 72 y.o.right handed femalewith history of endometrial cancer status post hysterectomy, tobacco abuse. Per chart review, patient, and daughter,patient lives withdaughter andgrandson. 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 Two-level home with bedroom on main  and 2 steps to entry. Presented 12/15/2017 with right-sided weaknesswith facial droopand aphasiaas well as general malaise. Cranial CTreviewed, unremarkable for acute process.Patient did not  receive TPA. MRI the brain showed acute nonhemorrhagic infarction of left basal ganglia corpus striatumwithout associated mass effect. MRA with no emergent large vessel occlusion or stenosis. Echocardiogram with ejection fraction of 78% grade 1 diastolic dysfunction. Neurology consulted presently on aspirin for CVA prophylaxis. SubcutaneousLovenox for DVT prophylaxis. Tolerating a regular diet. Presently on Rocephin empirically for suspect UTI. Physicaland occupationaltherapy evaluationscompleted with recommendations of physical medicine rehabilitation consult.Patient was admitted for a comprehensive rehabilitation program 12/17/17.   NIH Total: 2  Past Medical History      Past Medical History:  Diagnosis Date  . Chronic cholecystitis   . Complication of anesthesia   . Diverticulosis   . Endometrial cancer ([place]) 1977   s/p TAH  . History of transfusion 1970's  . PONV (postoperative nausea and vomiting)     Family History  family history includes Obesity in her daughter.  Prior Rehab/Hospitalizations:  Has the patient had major surgery during 100 days prior to admission? No  Current Medications   Current Facility-Administered Medications:  .  acetaminophen (TYLENOL) tablet 650 mg, 650 mg, Oral, Q4H PRN **OR** acetaminophen (TYLENOL) solution 650 mg, 650 mg, Per Tube, Q4H PRN **OR** acetaminophen (TYLENOL) suppository 650 mg, 650 mg, Rectal, Q4H PRN, Opyd, Timothy S, MD .  aspirin suppository 300 mg, 300 mg, Rectal, Daily **OR** aspirin tablet 325 mg, 325 mg, Oral, Daily, Opyd, Ilene Qua, MD, 325 mg at 12/17/17 0912 .  atorvastatin (LIPITOR) tablet 80 mg, 80 mg, Oral, q1800, Opyd, Ilene Qua, MD, 80 mg at 12/16/17 1837 .  brinzolamide (AZOPT) 1 % ophthalmic suspension 1 drop, 1  drop, Both Eyes, BID, Opyd, Ilene Qua, MD, 1 drop at 12/17/17 0914 .  cefTRIAXone (ROCEPHIN) 1 g in dextrose 5 % 50 mL IVPB, 1 g, Intravenous, Q24H, Vann, Jessica U, DO, Stopped at 12/17/17 0959 .  enoxaparin (LOVENOX) injection 40 mg, 40 mg, Subcutaneous, Daily, Opyd, Ilene Qua, MD, 40 mg at 12/17/17 0912 .  labetalol (NORMODYNE,TRANDATE) injection 5-10 mg, 5-10 mg, Intravenous, Q2H PRN, Opyd, Timothy S, MD .  latanoprost (XALATAN) 0.005 % ophthalmic solution 1 drop, 1 drop, Both Eyes, QHS, Opyd, Ilene Qua, MD, 1 drop at 12/16/17 2121 .  multivitamin with minerals tablet 1 tablet, 1 tablet, Oral, Daily, Opyd, Ilene Qua, MD, 1 tablet at 12/17/17 0912 .  senna-docusate (Senokot-S) tablet 1 tablet, 1 tablet, Oral, QHS PRN, Opyd, Ilene Qua, MD  Patients Current Diet: Fall precautions Diet Heart Room service appropriate? 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  She was active and fully independent prior to admission.    Home Assistive Devices / Equipment Home Assistive Devices/Equipment: None Home Equipment: Walker - 2 wheels, Bedside commode  Prior Device Use: Indicate devices/aids used by the patient prior to current illness, exacerbation or injury? None of the above  Prior Functional Level Prior Function Level of Independence: Independent Comments: independent in ADLs, IADLs, works 2 jobs Librarian, academic), drives   Self Care: Did the patient need help bathing, dressing, using the toilet or eating? Independent  Indoor Mobility: Did the patient need assistance with walking from room to room (with or without device)? Independent  Stairs: Did the patient  need assistance with internal  or external stairs (with or without device)? Independent  Functional Cognition: Did the patient need help planning regular tasks such as shopping or remembering to take medications? Independent  Current Functional Level Cognition  Overall Cognitive Status: Impaired/Different from baseline Difficult to assess due to: Level of arousal Current Attention Level: Selective Orientation Level: Oriented X4 Following Commands: Follows one step commands consistently Safety/Judgement: Decreased awareness of safety, Decreased awareness of deficits General Comments: Pt with poor initiation of tasks, requiring verbal and tactile cues. Decreased problem solving during mobility. Increased time for processing and cues throughout session    Extremity Assessment (includes Sensation/Coordination)  Upper Extremity Assessment: RUE deficits/detail RUE Deficits / Details: Poor grasp strength and FM skills as seen during funcitonal tasks and testing (i.e. pt unabel to maintian grasp during grooming and dropped items). Pt able to perform finger oppositiong with significant amount of time and effort. Pt demonstrating undershooting for target reaching.  RUE Sensation: decreased light touch RUE Coordination: decreased fine motor, decreased gross motor  Lower Extremity Assessment: Defer to PT evaluation RLE Deficits / Details: AROM WFL, strength grossly assessed at 3/5 at hip, and 4/5 in knee and ankle. RLE Coordination: decreased gross motor, decreased fine motor    ADLs  Overall ADL's : Needs assistance/impaired Eating/Feeding: Minimal assistance, Sitting Grooming: Maximal assistance, Standing, Oral care, Wash/dry hands, Wash/dry face Grooming Details (indicate cue type and reason): Max A for standing balance with significant R lateral lean. Pt requiring Max cues to recognize her was leaning and then required Max A for correct balance. Pt requiring cues during grooming to initiate  and sequence tasks. Pt demosntrating poor attention, processing, problem solving, and awarness. Upper Body Bathing: Moderate assistance, Sitting Lower Body Bathing: Maximal assistance, Sit to/from stand Upper Body Dressing : Moderate assistance, Sitting Lower Body Dressing: Maximal assistance, Sit to/from stand Toilet Transfer: Moderate assistance, Ambulation, RW, Regular Toilet, Maximal assistance Toilet Transfer Details (indicate cue type and reason): Mod A to power up into standing. Max A for R lateral lean in standing.  Toileting- Clothing Manipulation and Hygiene: Maximal assistance, Sit to/from stand, Cueing for sequencing, Cueing for safety Toileting - Clothing Manipulation Details (indicate cue type and reason): Pt requiring Max A for standing balance during toielt hygiene and pulling up underwear due to signficant R lateral lean. Functional mobility during ADLs: Moderate assistance, Rolling walker(Bumping into wall and object on R side) General ADL Comments: Pt dmeonstrating decreased funcitonal perform and poor awarness of deficits and safety. Pt presenting with poor grasp strength, FM skills, and funcitonal use of RUE (dominant hand). Pt with decreased cognition dmeonstrating poor attention, problem solving, and awarness. Pt requiring Max cues throughout session for performance of ADLs.    Mobility  Overal bed mobility: Needs Assistance Bed Mobility: Supine to Sit Supine to sit: Min assist General bed mobility comments: in chair on arrival    Transfers  Overall transfer level: Needs assistance Equipment used: 1 person hand held assist Transfers: Sit to/from Stand Sit to Stand: Min assist Stand pivot transfers: Mod assist General transfer comment: Pt performed sit<>stand 10x for neuromuscular re-education. Manual facilitation required several times at R quad to encourage full knee extension. Pt eventually able to achieve full knee extension with VC and increased time.      Ambulation / Gait / Stairs / Wheelchair Mobility  Ambulation/Gait Ambulation/Gait assistance: Museum/gallery curator (Feet): 80 Feet(16x5) Assistive device: 1 person hand held assist(Hand rail on L and HHA on R) Gait Pattern/deviations: Decreased step length - right,  Decreased stance time - right, Decreased weight shift to left, Narrow base of support, Step-to pattern General Gait Details: Pt with modest R lateral lean during ambulation. Min A for steadying with HHA. Cues for increased knee extension on R. Gait velocity: slowed Gait velocity interpretation: Below normal speed for age/gender    Posture / Balance Dynamic Sitting Balance Sitting balance - Comments: needs R UE support to correct R lean Balance Overall balance assessment: Needs assistance Sitting-balance support: Feet supported, Bilateral upper extremity supported Sitting balance-Leahy Scale: Poor Sitting balance - Comments: needs R UE support to correct R lean Postural control: Right lateral lean Standing balance support: Bilateral upper extremity supported Standing balance-Leahy Scale: Poor Standing balance comment: minA for maintaining balance    Special needs/care consideration BiPAP/CPAP: No CPM: No Continuous Drip IV: No Dialysis: No         Life Vest; No Oxygen; No Special Bed; No Trach Size: No Wound Vac (area): No       Skin: Bruising to bilateral arms                               Bowel mgmt: Continent, last BM 1/22 Bladder mgmt: Incontinent due to increased urgency and frequency Diabetic mgmt: No, HgbA1c - 5.1        Previous Home Environment Living Arrangements: Other relatives Available Help at Discharge: Family, Available 24 hours/day Type of Home: House Home Layout: Two level Alternate Level Stairs-Rails: Left Alternate Level Stairs-Number of Steps: 13 Home Access: Stairs to enter Entrance Stairs-Rails: Left Entrance Stairs-Number of Steps: 2 Bathroom Shower/Tub: Clinical cytogeneticist: Programmer, systems: Yes Home Care Services: No Additional Comments: Home living and prior function collected from daughters  Discharge Living Setting Plans for Discharge Living Setting: Patient's home, Lives with (comment)(grandson ) Type of Home at Discharge: House Discharge Home Layout: Two level, Able to live on main level with bedroom/bathroom(office and laundry are in the basement) Alternate Level Stairs-Rails: Left Alternate Level Stairs-Number of Steps: 12 Discharge Home Access: Stairs to enter Entrance Stairs-Rails: Left Entrance Stairs-Number of Steps: 3 Discharge Bathroom Shower/Tub: Walk-in shower Discharge Bathroom Toilet: Standard Discharge Bathroom Accessibility: Yes How Accessible: Accessible via walker Does the patient have any problems obtaining your medications?: No  Social/Family/Support Systems Patient Roles: Parent, Other (Comment)(Grandparent ) Contact Information: Daughters: Lynelle Smoke [phone] Anticipated Caregiver: Lynelle Smoke and grandson, Brandon  Anticipated Caregiver's Contact Information: see above  Ability/Limitations of Caregiver: None Caregiver Availability: 24/7 Discharge Plan Discussed with Primary Caregiver: Yes Is Caregiver In Agreement with Plan?: Yes Does Caregiver/Family have Issues with Lodging/Transportation while Pt is in Rehab?: No  Goals/Additional Needs Patient/Family Goal for Rehab: PT/OT: Supervision-Min A; SLP: Mod I  Expected length of stay: 13-17 days  Cultural Considerations: None Dietary Needs: Heart Healthy diet restrictions  Equipment Needs: TBD Special Service Needs: None Pt/Family Agrees to Admission and willing to participate: Yes Program Orientation Provided & Reviewed with Pt/Caregiver Including Roles  & Responsibilities: Yes  Decrease burden of Care through IP rehab admission: No  Possible need for SNF placement upon discharge: No  Patient Condition: This  patient's condition remains as documented in the consult dated 12/17/17, in which the Rehabilitation Physician determined and documented that the patient's condition is appropriate for intensive rehabilitative care in an inpatient rehabilitation facility. Will admit to inpatient rehab today.  Preadmission Screen Completed By:  Gunnar Fusi, 12/17/2017 2:46 PM ______________________________________________________________________   Discussed status with Dr.  Naaman Plummer on 12/17/17 at 1450 and received telephone approval for admission today.  Admission Coordinator:  Gunnar Fusi, time 1450/Date 12/17/17             Cosigned by: Meredith Staggers, MD at 12/17/2017 3:07 PM  Revision History

## 2017-12-18 NOTE — Progress Notes (Signed)
Physical Medicine and Rehabilitation Consult Reason for Consult: Right sided weakness and aphasia Referring Physician: [facility]   HPI: [name] is a 72 y.o. right handed female with history of endometrial cancer status post hysterectomy, tobacco abuse. Per chart review, patient, and daughter, patient lives with daughter and grandson. 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 Two-level home with bedroom on main and 2 steps to entry. Presented 12/15/2017 with right-sided weakness and aphasia as well as general malaise. Cranial CT reviewed, unremarkable for acute process. Patient did not receive TPA. 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 MRA with no emergent large vessel occlusion or stenosis. Echocardiogram with ejection fraction of 16% grade 1 diastolic dysfunction. Neurology consulted presently on aspirin for CVA prophylaxis. Subcutaneous Lovenox for DVT prophylaxis. Tolerating a regular diet. Presently on Rocephin empirically for suspect UTI. Physical therapy evaluation completed with recommendations of physical medicine rehabilitation consult.   Review of Systems  Constitutional: Negative for chills and fever.  HENT: Negative for hearing loss.   Eyes: Negative for blurred vision and double vision.  Respiratory: Negative for cough and shortness of breath.   Cardiovascular: Negative for chest pain, palpitations and leg swelling.  Gastrointestinal: Positive for constipation. Negative for nausea.  Genitourinary: Negative for flank pain and hematuria.  Musculoskeletal: Positive for myalgias.  Skin: Negative for rash.  Neurological: Positive for speech change and focal weakness. Negative for seizures.  All other systems reviewed and are negative.      Past Medical History:  Diagnosis Date  . Chronic cholecystitis   . Complication of anesthesia   . Diverticulosis   . Endometrial  cancer ([place]) 1977   s/p TAH  . History of transfusion 1970's  . PONV (postoperative nausea and vomiting)    Past Surgical History:  Procedure Laterality Date  . ABDOMINAL HYSTERECTOMY  1977   for endometrial cancer  . HIP ARTHROPLASTY Left 11/18/2016   Procedure: ARTHROPLASTY BIPOLAR HIP (HEMIARTHROPLASTY);  Surgeon: Nicholes Stairs, MD;  Location: [facility];  Service: Orthopedics;  Laterality: Left;  . LAPAROSCOPIC CHOLECYSTECTOMY SINGLE SITE WITH INTRAOPERATIVE CHOLANGIOGRAM N/A 07/19/2015   Procedure: LAPAROSCOPIC CHOLECYSTECTOMY SINGLE SITE WITH INTRAOPERATIVE CHOLANGIOGRAM;  Surgeon: Michael Boston, MD;  Location: [facility];  Service: General;  Laterality: N/A;  . TONSILLECTOMY    . TUBAL LIGATION          Family History  Problem Relation Age of Onset  . Obesity Daughter    Social History:  reports that she has been smoking.  she has never used smokeless tobacco. She reports that she does not drink alcohol or use drugs. Allergies:       Allergies  Allergen Reactions  . Penicillins Nausea And Vomiting    Has patient had a PCN reaction causing immediate rash, facial/tongue/throat swelling, SOB or lightheadedness with hypotension: no Has patient had a PCN reaction causing severe rash involving mucus membranes or skin necrosis:no Has patient had a PCN reaction that required hospitalization no Has patient had a PCN reaction occurring within the last 10 years: no If all of the above answers are "NO", then may proceed with Cephalosporin use.   Medications Prior to Admission  Medication Sig Dispense Refill  . brinzolamide (AZOPT) 1 % ophthalmic suspension Place 1 drop into both eyes 2 (two) times daily.    . Cyanocobalamin (VITAMIN B-12 PO) Take 1 tablet by mouth daily.    Marland Kitchen ibuprofen (ADVIL,MOTRIN) 200 MG tablet Take 400-600 mg by  mouth every 6 (six) hours as needed (pain).    . Latanoprostene Bunod (VYZULTA) 0.024 % SOLN Place 1 drop into both eyes at bedtime.     . Multiple Vitamin (MULTIVITAMIN WITH MINERALS) TABS tablet Take 1 tablet by mouth daily.      Home: Home Living Family/patient expects to be discharged to:: Private residence Living Arrangements: Other relatives Available Help at Discharge: Family, Available 24 hours/day Type of Home: House Home Access: Stairs to enter [employer] of Steps: 2 Entrance Stairs-Rails: Left Home Layout: Two level Alternate Level Stairs-Number of Steps: 13 Alternate Level Stairs-Rails: Left Bathroom Shower/Tub: Multimedia programmer: Standard Bathroom Accessibility: Yes Home Equipment: Environmental consultant - 2 wheels, Bedside commode Additional Comments: Home living and prior function collected from daughters due to pt falling asleep during interview  Functional History: Prior Function Level of Independence: Independent Comments: works 2 jobs, 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 Transfers Sit to Stand: [place] pivot transfers: Mod assist General transfer comment: modA for stand pivot to/from [facility] for powerup and stedying, decreased ability to move R LE. minA for powerup to RW with sit>stand,.vc for hand placement for power up Ambulation/Gait Ambulation/Gait assistance: Mod assist Ambulation Distance (Feet): 40 Feet Assistive device: Rolling walker (2 wheeled) Gait Pattern/deviations: Decreased step length - right, Decreased stance time - right, Decreased weight shift to left, Shuffle, Narrow base of support, Staggering right General Gait Details: modA for steadying with RW and correcting R lateral lean, verbal  cues and minA for weightshift to L to advance R LE, no buckling of R knee with weightbearing, pt states she is aware she is leaning R but can not correct Gait velocity: slowed Gait velocity interpretation: Below normal speed for age/gender  ADL:  Cognition: Cognition Overall Cognitive Status: Difficult to assess Orientation Level: Oriented X4 Cognition Arousal/Alertness: Lethargic Behavior During Therapy: Flat affect Overall Cognitive Status: Difficult to assess Difficult to assess due to: Level of arousal  Blood pressure (!) 149/47, pulse 60, temperature 97.7 F (36.5 C), temperature source Oral, resp. rate 18, height 5\' 4"  (1.626 m), weight 61.4 kg (135 lb 5.8 oz), SpO2 96 %. Physical Exam  Vitals reviewed. Constitutional: She is oriented to person, place, and time. She appears well-developed and well-nourished.  HENT:  Head: Normocephalic and atraumatic.  Eyes: EOM are normal. Right eye exhibits no discharge. Left eye exhibits no discharge.  Neck: Normal range of motion. Neck supple. No thyromegaly present.  Cardiovascular: Normal rate, regular rhythm and normal heart sounds.  Respiratory: Effort normal and breath sounds normal. No respiratory distress.  GI: Soft. Bowel sounds are normal. She exhibits no distension.  Musculoskeletal:  No edema or tenderness in extremities  Neurological: She is alert and oriented to person, place, and time.  Makes good eye contact with examiner.  Follows simple commands.  She does have some mild aphasia Right facial droop Motor: LUE/LLE: 5/5 proximal to distal LUE: 4/5 proximal to distal with ataxia corrected by very slow movements LLE: 4/5 proximal to distal   Skin: Skin is warm and dry.  Psychiatric: She has a normal mood and affect. Her behavior is normal.  Assessment/Plan: Diagnosis:  left basal ganglia  corpus striatum infarct Labs and images independently reviewed.  Records reviewed and summated above. Stroke: Continue secondary  stroke prophylaxis and Risk Factor Modification listed below:   Antiplatelet therapy:   Blood Pressure Management:  Continue current medication with prn's with permisive HTN per primary team Statin Agent:   Tobacco abuse:   Right sided hemiparesis:  Motor recovery: Fluoxetine   1. Does the need for close, 24 hr/day medical supervision in concert with the patient's rehab needs make it unreasonable for this patient to be served in a less intensive setting? Yes  2. Co-Morbidities requiring supervision/potential complications: endometrial cancer status post hysterectomy, tobacco abuse (counsel), diastolic CHF (monitor for signs and symptoms of fluid overload), UTI (cont abx), HTN (monitor and provide prns in accordance with increased physical exertion and pain), ABLA (transfuse if necessary to ensure appropriate perfusion for increased activity tolerance), HLD (cont meds) 3. Due to safety, disease management and patient education, does the patient require 24 hr/day rehab nursing? Yes 4. Does the patient require coordinated care of a physician, rehab nurse, PT (1-2 hrs/day, 5 days/week), OT (1-2 hrs/day, 5 days/week) and SLP (1-2 hrs/day, 5 days/week) to address physical and functional deficits in the context of the above medical diagnosis(es)? Yes Addressing deficits in the following areas: balance, endurance, locomotion, strength, transferring, bathing, dressing, toileting, speech and psychosocial support 5. Can the patient actively participate in an intensive therapy program of at least 3 hrs of therapy per day at least 5 days per week? Yes 6. The potential for patient to make measurable gains while on inpatient rehab is excellent 7. Anticipated functional outcomes upon discharge from inpatient rehab are supervision and min assist  with PT, supervision and min assist with OT, modified independent with SLP. 8. Estimated rehab length of stay to reach the above functional goals is: 13-17  days. 9. Anticipated D/C setting: Home 10. Anticipated post D/C treatments: HH therapy and Home excercise program 11. Overall Rehab/Functional Prognosis: good  RECOMMENDATIONS: This patient's condition is appropriate for continued rehabilitative care in the following setting: Recommend CIR, however, pt is hesitant to stay in hospital. She would like to discuss with her family.  If patient decides she is going to go home, recommend [facility] with PM&R outpt follow up. Patient has agreed to participate in recommended program. Potentially Note that insurance prior authorization may be required for reimbursement for recommended care.  Comment: Rehab Admissions Coordinator to follow up.  Delice Lesch, MD, ABPMR Lavon Paganini Angiulli, PA-C 12/17/2017          Revision History                        Routing History

## 2017-12-18 NOTE — Evaluation (Signed)
Occupational Therapy Assessment and Plan  Patient Details  Name: [name] MRN: [id number] Date of Birth: [birth year]  OT Diagnosis: abnormal posture, cognitive deficits and muscle weakness (generalized) Rehab Potential: Rehab Potential (ACUTE ONLY): Excellent ELOS: 10-12 days   Today's Date: 12/18/2017 OT Individual Time: 1000-1055 OT Individual Time Calculation (min): 55 min     Problem List:  Patient Active Problem List   Diagnosis Date Noted  . Left basal ganglia embolic stroke ([place]) 59/74/7185  . History of endometrial cancer   . Chronic diastolic congestive heart failure ([place])   . Hypertension   . Acute blood loss anemia   . Pure hypercholesterolemia   . Acute lower UTI   . Right sided weakness 12/15/2017  . Aphasia 12/15/2017  . Acute CVA (cerebrovascular accident) ([place]) 12/15/2017  . Closed fracture of left hip ([place]) 11/18/2016  . Tobacco abuse   . Chronic cholecystitis s/p lap chole 07/19/2015 07/19/2015    Past Medical History:  Past Medical History:  Diagnosis Date  . Chronic cholecystitis   . Complication of anesthesia   . Diverticulosis   . Endometrial cancer ([place]) 1977   s/p TAH  . History of transfusion 1970's  . PONV (postoperative nausea and vomiting)    Past Surgical History:  Past Surgical History:  Procedure Laterality Date  . ABDOMINAL HYSTERECTOMY  1977   for endometrial cancer  . HIP ARTHROPLASTY Left 11/18/2016   Procedure: ARTHROPLASTY BIPOLAR HIP (HEMIARTHROPLASTY);  Surgeon: Nicholes Stairs, MD;  Location: [facility];  Service: Orthopedics;  Laterality: Left;  . LAPAROSCOPIC CHOLECYSTECTOMY SINGLE SITE WITH INTRAOPERATIVE CHOLANGIOGRAM N/A 07/19/2015   Procedure: LAPAROSCOPIC CHOLECYSTECTOMY SINGLE SITE WITH INTRAOPERATIVE CHOLANGIOGRAM;  Surgeon: Michael Boston, MD;  [facility];  Service: General;  Laterality: N/A;  . TONSILLECTOMY    . TUBAL LIGATION      Assessment & Plan Clinical Impression: Patient is a 72 y.o. year old female  with recent admission to the hospital on01/20/2019 with right-sided weakness with facial droop and aphasia as well as general malaise. Cranial CT reviewed, unremarkable for acute process. Patient did not receive TPA. 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 MRA with no emergent large vessel occlusion or stenosis. Echocardiogram with ejection fraction of 50% grade 1 diastolic dysfunction. Neurology consulted presently on aspirin for CVA prophylaxis. Subcutaneous Lovenox for DVT prophylaxis. Tolerating a regular diet. Presently on Rocephin empirically for suspect UTI.  Patient transferred to CIR on 12/17/2017 .    Patient currently requires min with basic self-care skills secondary to muscle weakness, impaired timing and sequencing, decreased coordination and decreased motor planning, decreased midline orientation, decreased attention to right and right side neglect and decreased initiation, decreased attention, decreased awareness, decreased problem solving, decreased safety awareness, decreased memory and delayed processing.  Prior to hospitalization, patient could complete BADL and iADL with independent .  Patient will benefit from skilled intervention to increase independence with basic self-care skills prior to discharge home with care partner.  Anticipate patient will require intermittent supervision and follow up home health.  OT - End of Session Endurance Deficit: Yes Endurance Deficit Description: Rest breaks needed within BADL tasks OT Assessment Rehab Potential (ACUTE ONLY): Excellent OT Patient demonstrates impairments in the following area(s): Balance;Cognition;Endurance;Motor;Safety;Perception;Vision OT Basic ADL's Functional Problem(s): Grooming;Bathing;Dressing;Toileting;Eating OT Transfers Functional Problem(s): Toilet;Tub/Shower OT Additional Impairment(s): Fuctional Use of Upper Extremity OT Plan OT  Intensity: Minimum of 1-2 x/day, 45 to 90 minutes OT Frequency: 5 out of 7  days OT Duration/Estimated Length of Stay: 10-12 days OT Treatment/Interventions: Balance/vestibular training;Cognitive remediation/compensation;Community reintegration;Discharge planning;DME/adaptive equipment instruction;Functional mobility training;Neuromuscular re-education;Patient/family education;Self Care/advanced ADL retraining;Therapeutic Activities;Therapeutic Exercise;UE/LE Strength taining/ROM;UE/LE Coordination activities;Visual/perceptual remediation/compensation OT Self Feeding Anticipated Outcome(s): Mod I OT Basic Self-Care Anticipated Outcome(s): Mod I OT Toileting Anticipated Outcome(s): Supervision OT Bathroom Transfers Anticipated Outcome(s): Supervision OT Recommendation Recommendations for Other Services: Therapeutic Recreation consult Therapeutic Recreation Interventions: Pet therapy Patient destination: Home Follow Up Recommendations: Home health OT Equipment Recommended: To be determined   Skilled Therapeutic Intervention Initial eval completed with treatment provided to address functional transfers, functional use of R UE, improved sit<>stand, standing tolerance, and adapted bathing/dressing skills. Pt came to sitting EOB with supervision. Pt ambulated in room with min HHA to collect clothing, then ambulate to bathroom. Mod cues to integrate R UE into bathing tasks.  Min A for balance when reaching to wash buttocks and cues for thoroughness.  LB/UB dressing completed seated EOB with assistance to thread R LE into clothing and assist to pull up pants. UB dressing with Min A overall. Pt ambulated in hallway with focus on head turns and object avoidance on  R side. Pt returned to room and left semi-reclined in bed with daughter present and bed alarm on.   OT Evaluation Precautions/Restrictions  Precautions Precautions: Fall Restrictions Weight Bearing Restrictions: No Pain Pain  Assessment Pain Assessment: No/denies pain Home Living/Prior Functioning Home Living Family/patient expects to be discharged to:: Private residence Living Arrangements: Children Available Help at Discharge: Family, Available 24 hours/day(grandson only, per pt) Type of Home: House Home Access: Stairs to enter [employer] of Steps: 3-4 Entrance Stairs-Rails: None Home Layout: Able to live on main level with bedroom/bathroom Alternate Level Stairs-Number of Steps: 13 Alternate Level Stairs-Rails: Left Bathroom Shower/Tub: Multimedia programmer: Standard Bathroom Accessibility: Yes  Lives With: Other (Comment)(per pt, lives with grandson only) Prior Function Level of Independence: Independent with gait, Independent with transfers  Able to Take Stairs?: Yes Driving: Yes Vocation: Full time employment Vocation Requirements: worked as an Optometrist Comments: independent in ADLs, IADLs, works 2 jobs Librarian, academic), drives  ADL ADL ADL Comments: Please see functional navigator Vision Baseline Vision/History: No visual deficits Wears Glasses: Reading only Patient Visual Report: No change from baseline Vision Assessment?: Vision impaired- to be further tested in functional context Eye Alignment: Within Functional Limits Tracking/Visual Pursuits: Impaired - to be further tested in functional context;Requires cues, head turns, or add eye shifts to track;Unable to hold eye position out of midline Visual Fields: Right visual field deficit;Impaired-to be further tested in functional context Depth Perception: Undershoots Perception  Perception: Impaired Inattention/Neglect: Does not attend to right visual field Praxis Praxis: Impaired Praxis Impairment Details: Motor planning Cognition Overall Cognitive Status: Impaired/Different from baseline Arousal/Alertness: Awake/alert Orientation Level: Situation;Place;Person Person: Oriented Place: Disoriented(Stated "[place], [place]") Situation: Oriented Year: 2019 Month: (Patient kept saying "20" Able to say Jan when given months) Day of Week: Incorrect(difficulty undestanding question-recptive ) Memory: Impaired Memory Impairment: Decreased recall of new information Immediate Memory Recall: Sock;Blue;Bed Memory Recall: Blue Memory Recall Blue: With Cue Attention: Sustained Sustained Attention: Impaired Sustained Attention Impairment: Verbal basic;Functional basic Awareness: Impaired Awareness Impairment: Intellectual impairment Problem Solving: Impaired Problem Solving Impairment: Verbal basic;Functional basic Safety/Judgment: Impaired Sensation Sensation Light Touch: Impaired Detail Light Touch Impaired Details: Impaired RUE Proprioception: Impaired by gross assessment Coordination Gross Motor Movements are Fluid and Coordinated: No Fine Motor Movements are Fluid and Coordinated: No Coordination and Movement Description: slower coordination on R Motor  Motor  Motor: Other (comment);Abnormal postural alignment and control Mobility  Bed Mobility Bed Mobility: Supine to Sit Supine to Sit: 5: Supervision;With rails;HOB elevated Transfers Sit to Stand: 4: Min guard Stand to Sit: 4: Min guard  Trunk/Postural Assessment  Cervical Assessment Cervical Assessment: Within Functional Limits Thoracic Assessment Thoracic Assessment: Within Functional Limits Lumbar Assessment Lumbar Assessment: Within Functional Limits Postural Control Postural Control: Deficits on evaluation Righting Reactions: delayed on R Protective Responses: delayed on R  Balance Balance Balance Assessed: Yes Static Sitting Balance Static Sitting - Balance Support: Feet supported;No upper extremity supported Static Sitting - Level of Assistance: 5: Stand by assistance Dynamic Sitting Balance Dynamic Sitting - Balance Support: Feet supported;During functional activity Dynamic Sitting - Level of Assistance: 4: Min  assist Static Standing Balance Static Standing - Balance Support: During functional activity Static Standing - Level of Assistance: 4: Min assist Dynamic Standing Balance Dynamic Standing - Balance Support: During functional activity Dynamic Standing - Level of Assistance: 4: Min assist Extremity/Trunk Assessment RUE Assessment RUE Assessment: Exceptions to [facility] RUE Strength RUE Overall Strength: Deficits RUE Overall Strength Comments: 3+/5 Right Shoulder Flexion: 3+/5 LUE Assessment LUE Assessment: Within Functional Limits   See Function Navigator for Current Functional Status.   Refer to Care Plan for Long Term Goals  Recommendations for other services: Therapeutic Recreation  Pet therapy   Discharge Criteria: Patient will be discharged from OT if patient refuses treatment 3 consecutive times without medical reason, if treatment goals not met, if there is a change in medical status, if patient makes no progress towards goals or if patient is discharged from hospital.  The above assessment, treatment plan, treatment alternatives and goals were discussed and mutually agreed upon: by patient and by family  Valma Cava 12/18/2017, 11:48 AM

## 2017-12-18 NOTE — Evaluation (Signed)
Physical Therapy Assessment and Plan  Patient Details  Name: Francisco Eyerly MRN: [id number] Date of Birth: Nov 15, 1946  PT Diagnosis: Abnormality of gait, Coordination disorder, Hemiparesis dominant, Impaired cognition and Muscle weakness Rehab Potential: Good ELOS: 10-12 days   Today's Date: 12/18/2017 PT Individual Time: 0900-1000 PT Individual Time Calculation (min): 60 min    Problem List:  Patient Active Problem List   Diagnosis Date Noted  . Left basal ganglia embolic stroke ([place]) 02/58/5277  . History of endometrial cancer   . Chronic diastolic congestive heart failure ([place])   . Hypertension   . Acute blood loss anemia   . Pure hypercholesterolemia   . Acute lower UTI   . Right sided weakness 12/15/2017  . Aphasia 12/15/2017  . Acute CVA (cerebrovascular accident) ([place]) 12/15/2017  . Closed fracture of left hip ([place]) 11/18/2016  . Tobacco abuse   . Chronic cholecystitis s/p lap chole 07/19/2015 07/19/2015    Past Medical History:  Past Medical History:  Diagnosis Date  . Chronic cholecystitis   . Complication of anesthesia   . Diverticulosis   . Endometrial cancer ([place]) 1977   s/p TAH  . History of transfusion 1970's  . PONV (postoperative nausea and vomiting)    Past Surgical History:  Past Surgical History:  Procedure Laterality Date  . ABDOMINAL HYSTERECTOMY  1977   for endometrial cancer  . HIP ARTHROPLASTY Left 11/18/2016   Procedure: ARTHROPLASTY BIPOLAR HIP (HEMIARTHROPLASTY);  Surgeon: Nicholes Stairs, MD;  Location: [facility];  Service: Orthopedics;  Laterality: Left;  . LAPAROSCOPIC CHOLECYSTECTOMY SINGLE SITE WITH INTRAOPERATIVE CHOLANGIOGRAM N/A 07/19/2015   Procedure: LAPAROSCOPIC CHOLECYSTECTOMY SINGLE SITE WITH INTRAOPERATIVE CHOLANGIOGRAM;  Surgeon: Michael Boston, MD;  Location: [facility];  Service: General;  Laterality: N/A;  . TONSILLECTOMY    . TUBAL LIGATION      Assessment & Plan Clinical Impression: Jeremie Abdelaziz is a 72 y.o. right handed female  with history of endometrial cancer status post hysterectomy, tobacco abuse. Per chart review, patient, and daughter, patient lives with daughter and grandson. Independent prior to admission still working as a book-keeper. Two-level home with bedroom on main and 2 steps to entry. Presented 12/15/2017 with right-sided weakness with facial droop and aphasia as well as general malaise. Cranial CT reviewed, unremarkable for acute process. Patient did not receive TPA. 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 MRA with no emergent large vessel occlusion or stenosis. Echocardiogram with ejection fraction of 82% grade 1 diastolic dysfunction. Neurology consulted presently on aspirin for CVA prophylaxis. Subcutaneous Lovenox for DVT prophylaxis. Tolerating a regular diet. Presently on Rocephin empirically for suspect UTI. Physical and occupational therapy evaluations completed with recommendations of physical medicine rehabilitation consult. Patient was admitted for a comprehensive rehabilitation program 12/17/17.  Patient transferred to CIR on 12/17/2017 .   Patient currently requires min with mobility secondary to muscle weakness, abnormal tone, unbalanced muscle activation, decreased coordination and decreased motor planning, decreased midline orientation and decreased attention to right, decreased attention, decreased awareness, decreased problem solving, decreased safety awareness, decreased memory and delayed processing and decreased sitting balance, decreased standing balance, decreased postural control, hemiplegia and decreased balance strategies.  Prior to hospitalization, patient was independent  with mobility and lived with Other (Comment)(per pt, lives with grandson only) in a House home.  Home access is 3-4Stairs to enter.  Patient will benefit from skilled PT intervention to maximize safe functional mobility, minimize fall risk  and decrease caregiver burden  for planned discharge home with 24 hour supervision.  Anticipate patient will benefit from follow up OP at discharge.  PT - End of Session Activity Tolerance: Tolerates 30+ min activity without fatigue PT Assessment Rehab Potential (ACUTE/IP ONLY): Good PT Barriers to Discharge: Decreased caregiver support PT Patient demonstrates impairments in the following area(s): Balance;Motor;Perception PT Transfers Functional Problem(s): Bed Mobility;Bed to Chair;Car;Furniture;Floor PT Locomotion Functional Problem(s): Ambulation;Stairs PT Plan PT Intensity: Minimum of 1-2 x/day ,45 to 90 minutes PT Frequency: 5 out of 7 days PT Duration Estimated Length of Stay: 10-12 days PT Treatment/Interventions: Ambulation/gait training;Community reintegration;DME/adaptive equipment instruction;Neuromuscular re-education;Psychosocial support;Stair training;UE/LE Strength taining/ROM;UE/LE Coordination activities;Therapeutic Activities;Functional electrical stimulation;Discharge planning;Balance/vestibular training;Cognitive remediation/compensation;Disease management/prevention;Functional mobility training;Patient/family education;Splinting/orthotics;Therapeutic Exercise;Visual/perceptual remediation/compensation PT Transfers Anticipated Outcome(s): supervision PT Locomotion Anticipated Outcome(s): supervision  PT Recommendation Recommendations for Other Services: Therapeutic Recreation consult Therapeutic Recreation Interventions: Pet therapy;Kitchen group;Outing/community reintergration Follow Up Recommendations: Outpatient PT;24 hour supervision/assistance Patient destination: Home Equipment Recommended: None recommended by PT  Skilled Therapeutic Intervention No c/o pain.  Session focus on initial PT evaluation, pt education on rehab process, goals, LOS, and safety plan.  Pt currently performing mobility as below.  Pt with difficulty problem solving in functional context, but  able to solve problems with increased time in structured tasks.  Pt returned to room at end of session and positioned in bed with bed alarm activated, call bell in reach and needs met.   PT Evaluation Precautions/Restrictions Precautions Precautions: Fall Restrictions Weight Bearing Restrictions: No General   Vital Signs  Pain Pain Assessment Pain Assessment: No/denies pain Home Living/Prior Functioning Home Living Available Help at Discharge: Family;Available 24 hours/day(grandson only, per pt) Type of Home: House Home Access: Stairs to enter [employer] of Steps: 3-4 Entrance Stairs-Rails: None Home Layout: Able to live on main level with bedroom/bathroom Alternate Level Stairs-Number of Steps: 13 Alternate Level Stairs-Rails: Left Bathroom Shower/Tub: Multimedia programmer: Standard Bathroom Accessibility: Yes  Lives With: Other (Comment)(per pt, lives with grandson only) Prior Function Level of Independence: Independent with gait;Independent with transfers  Able to Take Stairs?: Yes Driving: Yes Vocation: Full time employment Vocation Requirements: worked as an Optometrist Comments: independent in ADLs, IADLs, works 2 jobs Librarian, academic), drives  Vision/Perception  Vision - Assessment Eye Alignment: Within Advertising copywriter Tracking/Visual Pursuits: Impaired - to be further tested in functional context;Requires cues, head turns, or add eye shifts to track;Unable to hold eye position out of midline Perception Perception: Impaired Inattention/Neglect: Does not attend to right visual field Praxis Praxis: Impaired Praxis Impairment Details: Motor planning  Cognition Overall Cognitive Status: Impaired/Different from baseline Arousal/Alertness: Awake/alert Orientation Level: Oriented to person;Oriented to place;Disoriented to time;Disoriented to situation Attention: Sustained Sustained Attention: Impaired Sustained Attention Impairment: Verbal  basic;Functional basic Memory: Impaired Memory Impairment: Decreased recall of new information Awareness: Impaired Awareness Impairment: Intellectual impairment Problem Solving: Impaired Problem Solving Impairment: Verbal basic;Functional basic Safety/Judgment: Impaired Sensation Sensation Light Touch: Impaired Detail Light Touch Impaired Details: Impaired RUE Proprioception: Impaired by gross assessment Coordination Gross Motor Movements are Fluid and Coordinated: No Fine Motor Movements are Fluid and Coordinated: No Coordination and Movement Description: Dysmetria on R hand Motor  Motor Motor: Other (comment);Abnormal postural alignment and control  Mobility Bed Mobility Bed Mobility: Supine to Sit Supine to Sit: 5: Supervision;With rails;HOB elevated Transfers Transfers: Yes Sit to Stand: 4: Min guard Stand to Sit: 4: Min guard Locomotion     Trunk/Postural Assessment  Cervical Assessment Cervical Assessment: Within Functional Limits Thoracic Assessment Thoracic Assessment: Within Functional Limits Lumbar Assessment  Lumbar Assessment: Within Functional Limits Postural Control Postural Control: Deficits on evaluation Righting Reactions: delayed on R Protective Responses: delayed on R  Balance Balance Balance Assessed: Yes Static Sitting Balance Static Sitting - Balance Support: Feet supported;No upper extremity supported Static Sitting - Level of Assistance: 5: Stand by assistance Dynamic Sitting Balance Dynamic Sitting - Balance Support: Feet supported;During functional activity Dynamic Sitting - Level of Assistance: 4: Min assist Sitting balance - Comments: needs R UE support to correct R lean Static Standing Balance Static Standing - Balance Support: Left upper extremity supported;During functional activity Static Standing - Level of Assistance: 4: Min assist Dynamic Standing Balance Dynamic Standing - Balance Support: Left upper extremity supported;During  functional activity Dynamic Standing - Level of Assistance: 4: Min assist Extremity Assessment      RLE AROM (degrees) RLE Overall AROM Comments: WfL assessed in sitting RLE Strength Right Hip Flexion: 3+/5 Right Knee Flexion: 3+/5 Right Knee Extension: 3+/5 Right Ankle Dorsiflexion: 3+/5 Right Ankle Plantar Flexion: 3+/5 LLE Assessment LLE Assessment: Exceptions to WFL LLE AROM (degrees) LLE Overall AROM Comments: WFL assessed in sitting LLE Strength Left Hip Flexion: 3+/5 Left Knee Flexion: 3+/5 Left Knee Extension: 4/5 Left Ankle Dorsiflexion: 4/5 Left Ankle Plantar Flexion: 3+/5   See Function Navigator for Current Functional Status.   Refer to Care Plan for Long Term Goals  Recommendations for other services: Therapeutic Recreation  Pet therapy, Kitchen group and Outing/community reintegration  Discharge Criteria: Patient will be discharged from PT if patient refuses treatment 3 consecutive times without medical reason, if treatment goals not met, if there is a change in medical status, if patient makes no progress towards goals or if patient is discharged from hospital.  The above assessment, treatment plan, treatment alternatives and goals were discussed and mutually agreed upon: by patient  Michel Santee 12/18/2017, 11:31 AM

## 2017-12-18 NOTE — Progress Notes (Addendum)
Physical Therapy Note  Patient Details  Name: [name] MRN: [id number] Date of Birth: Sep 25, 1946 Today's Date: 12/18/2017  8871-9597, 30 min individual tx Pain: none per pt  Bed mobility with supervision.  Gait in room without AD to/from toilet and sink for continent voidingl.   Gait training without AD to/from room with min assist. 10 M timed test = 15 sec , = 2.19'/sec.   Patient demonstrates increased fall risk as noted by score of  21 /56 on Berg Balance Scale.  (<36= high risk for falls, close to 100%; 37-45 significant >80%; 46-51 moderate >50%; 52-55 lower >25%).  PT discussed implications of score with pt.  She expressed understanding.  Pt left resting in bed, set up for lunch, with needs at hand and bed alarm set.  See function navigator for current status.  Seerat Peaden 12/18/2017, 12:34 PM

## 2017-12-19 ENCOUNTER — Inpatient Hospital Stay (HOSPITAL_COMMUNITY): Payer: Medicare Other | Admitting: Occupational Therapy

## 2017-12-19 ENCOUNTER — Inpatient Hospital Stay (HOSPITAL_COMMUNITY): Payer: Medicare Other | Admitting: Speech Pathology

## 2017-12-19 ENCOUNTER — Inpatient Hospital Stay (HOSPITAL_COMMUNITY): Payer: Medicare Other | Admitting: Physical Therapy

## 2017-12-19 ENCOUNTER — Inpatient Hospital Stay (HOSPITAL_COMMUNITY): Payer: No Typology Code available for payment source | Admitting: Physical Therapy

## 2017-12-19 NOTE — Progress Notes (Signed)
Physical Therapy Session Note  Patient Details  Name: [name] MRN: [id number] Date of Birth: 1945/12/16  Today's Date: 12/19/2017 PT Individual Time: 3143-8887 PT Individual Time Calculation (min): 40 min   Skilled Therapeutic Interventions/Progress Updates:   Pt supine and agreeable to therapy, requesting to shower. Ambulated from EOB to shower chair w/ min assist for balance. Pt removed both upper and lower extremity garments in stance w/ min assist and occasional UE support on handrails in shower. Pt maintained dynamic sitting balance while showering w/ min guard to close supervision while seated on shower chair. Min guard during anterior weight shifting forward w/ min cues for safety. Ambulated back to w/c w/ min assist for balance. Performed multiple sit<>stands while dressing w/ min assist overall for balance support and assisted w/ LE garments. Pt brushed hair at sink w/ supervision as well. Ambulated to/from day room w/ min assist and performed NuStep 5 min @ L1 to facilitate more reciprocal movement pattern. Ambulated back to room, ended session in supine and in care of daughter - all needs met.   Therapy Documentation Precautions:  Precautions Precautions: Fall Restrictions Weight Bearing Restrictions: No Pain: Pain Assessment Pain Assessment: No/denies pain  See Function Navigator for Current Functional Status.   Therapy/Group: Individual Therapy  Chancie Lampert K Arnette 12/19/2017, 12:00 PM

## 2017-12-19 NOTE — Progress Notes (Signed)
Social Work   Orli Degrave, Eliezer Champagne  Social Worker  Physical Medicine and Rehabilitation  Patient Care Conference  Signed  Date of Service:  12/18/2017  3:31 PM          Signed          [] Hide copied text  [] Hover for details   Inpatient RehabilitationTeam Conference and Plan of Care Update Date: 12/18/2017   Time: 11:30 AM      Patient Name: [name]      Medical Record Number: [id number]  Date of Birth: Mar 16, 1946 Sex: Female         Room/Bed: 4W24C/4W24C-01 Payor Info: Payor: MEDICARE / Plan: MEDICARE PART A AND B / Product Type: *No Product type* /     Admitting Diagnosis: CVA  Admit Date/Time:  12/17/2017  6:37 PM Admission Comments: No comment available    Primary Diagnosis:  <principal problem not specified> Principal Problem: <principal problem not specified>       Patient Active Problem List    Diagnosis Date Noted  . Left basal ganglia embolic stroke ([place]) 81/19/1478  . History of endometrial cancer    . Chronic diastolic congestive heart failure ([place])    . Hypertension    . Acute blood loss anemia    . Pure hypercholesterolemia    . Acute lower UTI    . Right sided weakness 12/15/2017  . Aphasia 12/15/2017  . Acute CVA (cerebrovascular accident) ([place]) 12/15/2017  . Closed fracture of left hip ([place]) 11/18/2016  . Tobacco abuse    . Chronic cholecystitis s/p lap chole 07/19/2015 07/19/2015      Expected Discharge Date: Expected Discharge Date: 12/26/17   Team Members Present: Physician leading conference: Dr. Alysia Penna Social Worker Present: Ovidio Kin, LCSW Nurse Present: Junius Creamer, RN PT Present: Dwyane Dee, PT OT Present: Cherylynn Ridges, OT SLP Present: Weston Anna, SLP PPS Coordinator present : Daiva Nakayama, RN, CRRN       Current Status/Progress Goal Weekly Team Focus  Medical       monitoring potassium level   adjusting medications     Bowel/Bladder     continent  remain continent  continue to monitor &  assist as needed   Swallow/Nutrition/ Hydration               ADL's     Min A overall  Supervision/mod I  R NMR, functional ambulation, standing/sitting balance, R lean, R attention,   Mobility     min assist overall  supervision  balance, attention, cognitive remediation, funcitonal mobility progression    Communication     Eval Pending          Safety/Cognition/ Behavioral Observations   Eval Pending          Pain     no c/o pain, has tylenol prn  pain scale <4  assess & treat as needed   Skin     no skin break down  no new skin break down  assess q shift     *See Care Plan and progress notes for long and short-term goals.      Barriers to Discharge   Current Status/Progress Possible Resolutions Date Resolved   Physician                    Nursing                 PT  Decreased caregiver support  OT                 SLP            SW              Discharge Planning/Teaching Needs:    Home with daughter-Tammy and grandson assisting with her care.     Team Discussion:  Goals being set for supervision level. Being evaluated today. Poor po intake working on this. Leans to the right. MD working on low potassium level-monitoring. Tends to perserevates at times.  Revisions to Treatment Plan:  New eval target discharge date 1/31      [name] 12/19/2017, 8:31 AM                 Patient ID: [name], female   DOB: Aug 18, 1946, 72 y.o.   MRN: [id number]

## 2017-12-19 NOTE — IPOC Note (Signed)
Overall Plan of Care [facility]) Patient Details Name: [name] MRN: [id number] DOB: 02/05/1946  Admitting Diagnosis: <principal problem not specified>  Hospital Problems: Active Problems:   Left basal ganglia embolic stroke [facility])     Functional Problem List: Nursing Motor  PT Balance, Motor, Perception  OT Balance, Cognition, Endurance, Motor, Safety, Perception, Vision  SLP    TR         Basic ADL's: OT Grooming, Bathing, Dressing, Toileting, Eating     Advanced  ADL's: OT       Transfers: PT Bed Mobility, Bed to Chair, Car, Furniture, Futures trader, Metallurgist: PT Ambulation, Stairs     Additional Impairments: OT Fuctional Use of Upper Extremity  SLP Communication, Social Cognition expression Awareness, Problem Solving  TR      Anticipated Outcomes Item Anticipated Outcome  Self Feeding Mod I  Swallowing      Basic self-care  Mod I  Insurance underwriter Transfers Supervision  Bowel/Bladder  Remain continent of bowel & bladder  Transfers  supervision  Locomotion  supervision   Communication  Supervision  Cognition  Supervision   Pain  pain scale <4  Safety/Judgment  no falls with injury while on unit   Therapy Plan: PT Intensity: Minimum of 1-2 x/day ,45 to 90 minutes PT Frequency: 5 out of 7 days PT Duration Estimated Length of Stay: 10-12 days OT Intensity: Minimum of 1-2 x/day, 45 to 90 minutes OT Frequency: 5 out of 7 days OT Duration/Estimated Length of Stay: 10-12 days SLP Intensity: Minumum of 1-2 x/day, 30 to 90 minutes SLP Frequency: 3 to 5 out of 7 days SLP Duration/Estimated Length of Stay: 1/31    Team Interventions: Nursing Interventions Medication Management, Patient/Family Education, Disease Management/Prevention, Discharge Planning  PT interventions Ambulation/gait training, Community reintegration, DME/adaptive equipment instruction, Neuromuscular re-education, Psychosocial support, Stair  training, UE/LE Strength taining/ROM, UE/LE Coordination activities, Therapeutic Activities, Functional electrical stimulation, Discharge planning, Training and development officer, Cognitive remediation/compensation, Disease management/prevention, Functional mobility training, Patient/family education, Splinting/orthotics, Therapeutic Exercise, Visual/perceptual remediation/compensation  OT Interventions Training and development officer, Cognitive remediation/compensation, Community reintegration, Discharge planning, DME/adaptive equipment instruction, Functional mobility training, Neuromuscular re-education, Patient/family education, Self Care/advanced ADL retraining, Therapeutic Activities, Therapeutic Exercise, UE/LE Strength taining/ROM, UE/LE Coordination activities, Visual/perceptual remediation/compensation  SLP Interventions Cognitive remediation/compensation, Environmental controls, Speech/Language facilitation, Therapeutic Activities, Patient/family education, Functional tasks, Cueing hierarchy  TR Interventions    SW/CM Interventions Discharge Planning, Psychosocial Support, Patient/Family Education   Barriers to Discharge MD  Medical stability and Lack of/limited family support  Nursing      PT Decreased caregiver support    OT      SLP      SW       Team Discharge Planning: Destination: PT-Home ,OT- Home , SLP-Home Projected Follow-up: PT-Outpatient PT, 24 hour supervision/assistance, OT-  Home health OT, SLP-Outpatient SLP, 24 hour supervision/assistance Projected Equipment Needs: PT-None recommended by PT, OT- To be determined, SLP-None recommended by SLP Equipment Details: PT- , OT-  Patient/family involved in discharge planning: PT- Patient,  OT-Patient, Family member/caregiver, SLP-Patient, Family member/caregiver  MD ELOS: 13-17d Medical Rehab Prognosis:  Good Assessment:  72 y.o. right handed female with history of endometrial cancer status post hysterectomy, tobacco abuse. Per  chart review, patient, and daughter, patient lives with daughter and grandson. 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 Two-level home with bedroom on main and 2 steps to entry. Presented 12/15/2017 with right-sided weakness with facial droop and  aphasia as well as general malaise. Cranial CT reviewed, unremarkable for acute process. Patient did not receive TPA. 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 MRA with no emergent large vessel occlusion or stenosis. Echocardiogram with ejection fraction of 45% grade 1 diastolic dysfunction. Neurology consulted presently on aspirin for CVA prophylaxis. Subcutaneous Lovenox for DVT prophylaxis. Tolerating a regular diet.    Now requiring 24/7 Rehab RN,MD, as well as CIR level PT, OT and SLP.  Treatment team will focus on ADLs and mobility with goals set at [facility] I/Sup  See Team Conference Notes for weekly updates to the plan of care

## 2017-12-19 NOTE — Progress Notes (Signed)
Physical Therapy Session Note  Patient Details  Name: [name] MRN: [id number] Date of Birth: 10/25/1946  Today's Date: 12/19/2017 PT Individual Time: 1015-1100 PT Individual Time Calculation (min): 45 min   Short Term Goals: Week 1:  PT Short Term Goal 1 (Week 1): Pt will complete BERG Balance Assessment PT Short Term Goal 2 (Week 1): Pt will recall 2 activities from previous therapy session with min cues PT Short Term Goal 3 (Week 1): Pt will negotiate 12 steps with 1 rail for balance and strengthenign   Skilled Therapeutic Interventions/Progress Updates:    no c/o pain.  Session focus on balance during functional mobility.    Pt ambulates within room, occasional reaching for things to hold on to but ceases with cues, requires min guard for safe ambulation.  Standing balance for clothing management and hygiene for toileting with close supervision.  Pt ambulates throughout therapy department with min guard/close supervision.  Standing balance reaching R and L for horseshoes with R and then L foot on airex for increased weight shift challenge, verbal cues for upright posture and trunk elongation shortening.  10x sit<>stand without UE support with tactile cues for forward weight shift.  Gait training back to room focus on path finding from day room with min cues.  Pt able to recall that her room was the second to last room on the right, but then asking therapist at each room if it were hers. Pt left supine in bed with call bell in reach and needs met.   Therapy Documentation Precautions:  Precautions Precautions: Fall Restrictions Weight Bearing Restrictions: No   See Function Navigator for Current Functional Status.   Therapy/Group: Individual Therapy  Michel Santee 12/19/2017, 4:01 PM

## 2017-12-19 NOTE — Progress Notes (Signed)
Subjective/Complaints:  No issues overnite, RN states Bowel and bladder ok  ROS-  Neg CP, SOB, N/V/D  Objective: Vital Signs: Blood pressure (!) 142/45, pulse 69, temperature 98 F (36.7 C), temperature source Oral, resp. rate 16, height _0  (1.626 m), weight 60.8 kg (134 lb 0.6 oz), SpO2 96 %. No results found. Results for orders placed or performed during the hospital encounter of 12/17/17 (from the past 72 hour(s))  CBC     Status: None   Collection Time: 12/17/17  9:25 PM  Result Value Ref Range   WBC 5.2 4.0 - 10.5 K/uL   RBC 4.20 3.87 - 5.11 MIL/uL   Hemoglobin 12.6 12.0 - 15.0 g/dL   HCT 37.2 36.0 - 46.0 %   MCV 88.6 78.0 - 100.0 fL   MCH 30.0 26.0 - 34.0 pg   MCHC 33.9 30.0 - 36.0 g/dL   RDW 12.5 11.5 - 15.5 %   Platelets 218 150 - 400 K/uL  Creatinine, serum     Status: None   Collection Time: 12/17/17  9:25 PM  Result Value Ref Range   Creatinine, Ser 0.69 0.44 - 1.00 mg/dL   GFR calc non Af Amer >60 >60 mL/min   GFR calc Af Amer >60 >60 mL/min    Comment: (NOTE) The eGFR has been calculated using the CKD EPI equation. This calculation has not been validated in all clinical situations. eGFR's persistently <60 mL/min signify possible Chronic Kidney Disease.   CBC WITH DIFFERENTIAL     Status: None   Collection Time: 12/18/17  7:37 AM  Result Value Ref Range   WBC 4.5 4.0 - 10.5 K/uL   RBC 4.20 3.87 - 5.11 MIL/uL   Hemoglobin 12.5 12.0 - 15.0 g/dL   HCT 36.9 36.0 - 46.0 %   MCV 87.9 78.0 - 100.0 fL   MCH 29.8 26.0 - 34.0 pg   MCHC 33.9 30.0 - 36.0 g/dL   RDW 12.5 11.5 - 15.5 %   Platelets 204 150 - 400 K/uL   Neutrophils Relative % 60 %   Neutro Abs 2.6 1.7 - 7.7 K/uL   Lymphocytes Relative 25 %   Lymphs Abs 1.1 0.7 - 4.0 K/uL   Monocytes Relative 12 %   Monocytes Absolute 0.6 0.1 - 1.0 K/uL   Eosinophils Relative 3 %   Eosinophils Absolute 0.1 0.0 - 0.7 K/uL   Basophils Relative 0 %   Basophils Absolute 0.0 0.0 - 0.1 K/uL  Comprehensive metabolic  panel     Status: Abnormal   Collection Time: 12/18/17  7:37 AM  Result Value Ref Range   Sodium 142 135 - 145 mmol/L   Potassium 3.1 (L) 3.5 - 5.1 mmol/L   Chloride 107 101 - 111 mmol/L   CO2 26 22 - 32 mmol/L   Glucose, Bld 133 (H) 65 - 99 mg/dL   BUN 19 6 - 20 mg/dL   Creatinine, Ser 0.63 0.44 - 1.00 mg/dL   Calcium 8.7 (L) 8.9 - 10.3 mg/dL   Total Protein 5.6 (L) 6.5 - 8.1 g/dL   Albumin 3.2 (L) 3.5 - 5.0 g/dL   AST 16 15 - 41 U/L   ALT 10 (L) 14 - 54 U/L   Alkaline Phosphatase 92 38 - 126 U/L   Total Bilirubin 0.6 0.3 - 1.2 mg/dL   GFR calc non Af Amer >60 >60 mL/min   GFR calc Af Amer >60 >60 mL/min    Comment: (NOTE) The eGFR has been calculated using  the CKD EPI equation. This calculation has not been validated in all clinical situations. eGFR's persistently <60 mL/min signify possible Chronic Kidney Disease.    Anion gap 9 5 - 15     HEENT: normal Cardio: RRR and No murmur Resp: CTA B/L and unlabored GI: BS positive and NT, ND Extremity:  No Edema Skin:   Other IV site Left forearm CDI Neuro: Alert/Oriented, Abnormal Sensory difficult to assess secondary toaphasia , Abnormal Motor 3/5 R delt, bi, tri grip, 4/5 R HF, KE,ADF, 5/5 on left side, Abnormal FMC Ataxic/ dec FMC and Aphasic Musc/Skel:  Other no pain with ROM RUE or RLE Gen NAD   Assessment/Plan: 1. Functional deficits secondary to Right hemiparesis, aphasia  which require 3+ hours per day of interdisciplinary therapy in a comprehensive inpatient rehab setting. Physiatrist is providing close team supervision and 24 hour management of active medical problems listed below. Physiatrist and rehab team continue to assess barriers to discharge/monitor patient progress toward functional and medical goals. FIM: Function - Bathing Position: Shower Body parts bathed by patient: Right arm, Left arm, Chest, Abdomen, Front perineal area, Left upper leg, Right upper leg Body parts bathed by helper: Buttocks, Left  lower leg, Right lower leg Assist Level: Touching or steadying assistance(Pt > 75%)  Function- Upper Body Dressing/Undressing What is the patient wearing?: Pull over shirt/dress Pull over shirt/dress - Perfomed by patient: Thread/unthread right sleeve, Thread/unthread left sleeve, Put head through opening Pull over shirt/dress - Perfomed by helper: Pull shirt over trunk Assist Level: Touching or steadying assistance(Pt > 75%) Function - Lower Body Dressing/Undressing What is the patient wearing?: Pants, Non-skid slipper socks, Underwear Position: Sitting EOB Underwear - Performed by patient: Thread/unthread left underwear leg Underwear - Performed by helper: Thread/unthread right underwear leg, Pull underwear up/down Pants- Performed by patient: Thread/unthread left pants leg Pants- Performed by helper: Pull pants up/down, Thread/unthread right pants leg Non-skid slipper socks- Performed by patient: Don/doff right sock, Don/doff left sock Assist for footwear: Supervision/touching assist Assist for lower body dressing: Touching or steadying assistance (Pt > 75%)  Function - Toileting Toileting steps completed by patient: Adjust clothing prior to toileting, Performs perineal hygiene, Adjust clothing after toileting Toileting Assistive Devices: Grab bar or rail Assist level: Touching or steadying assistance (Pt.75%)  Function - Air cabin crew transfer assistive device: Grab bar Assist level to toilet: Touching or steadying assistance (Pt > 75%) Assist level from toilet: Touching or steadying assistance (Pt > 75%)  Function - Chair/bed transfer Chair/bed transfer method: Ambulatory Chair/bed transfer assist level: Touching or steadying assistance (Pt > 75%) Chair/bed transfer assistive device: Bedrails Chair/bed transfer details: Verbal cues for precautions/safety, Verbal cues for sequencing  Function - Locomotion: Wheelchair Will patient use wheelchair at discharge?:  No Function - Locomotion: Ambulation Assistive device: No device Max distance: 160 Assist level: Touching or steadying assistance (Pt > 75%) Assist level: Touching or steadying assistance (Pt > 75%) Assist level: Touching or steadying assistance (Pt > 75%) Assist level: Touching or steadying assistance (Pt > 75%) Assist level: Touching or steadying assistance (Pt > 75%)  Function - Comprehension Comprehension: Auditory Comprehension assist level: Understands basic 75 - 89% of the time/ requires cueing 10 - 24% of the time  Function - Expression Expression: Verbal Expression assist level: Expresses basic 75 - 89% of the time/requires cueing 10 - 24% of the time. Needs helper to occlude trach/needs to repeat words.  Function - Social Interaction Social Interaction assist level: Interacts appropriately with others with medication  or extra time (anti-anxiety, antidepressant).  Function - Problem Solving Problem solving assist level: Solves basic 90% of the time/requires cueing < 10% of the time  Function - Memory Memory assist level: Recognizes or recalls 75 - 89% of the time/requires cueing 10 - 24% of the time Patient normally able to recall (first 3 days only): That he or she is in a hospital  Medical Problem List and Plan:  1. 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 DVT Prophylaxis/Anticoagulation: Subcutaneous Lovenox. Normal platelet counts and no signs of bleeding  3. Pain Management: Tylenol as needed  4. Mood: Provide emotional support  5. Neuropsych: This patient is capable of making decisions on her own behalf.  6. Skin/Wound Care: Routine skin checks  7. Fluids/Electrolytes/Nutrition: Routine I&O's , I 435m 8. Hyperlipidemia. Lipitor  9. Tobacco abuse. Counseling  10. History of endometrial cancer status post hysterectomy  11.  Hypokalemia - on KCL  249m BID   LOS (Days) 2 A FACE TO FACE EVALUATION WAS PERFORMED  AnCharlett Randall/24/2019, 8:32 AM

## 2017-12-19 NOTE — Progress Notes (Signed)
Occupational Therapy Session Note  Patient Details  Name: [name] MRN: [id number] Date of Birth: 1946-05-03  Today's Date: 12/19/2017 OT Individual Time: 1415-1530 OT Individual Time Calculation (min): 75 min   Short Term Goals: Week 1:  OT Short Term Goal 1 (Week 1): STG=LTG 2/2 ELOS  Skilled Therapeutic Interventions/Progress Updates:    OT treatment session focused on R NMR, functional mobility, balance, and pt.family education. Pt's daughter present and practiced safe ambulation with pt to and from bathroom, daughters signed off for toilet transfers. Pt ambulated to therapy gym with min guard A and mod instructional cues to avoid objects on R side. Pt completed pipe tree puzzle with increased time to problem solve different size pipes. Pt given hand exercises handouts using red thera-putty. Pt easily distracted in gym environment and needed cues throughout session to maintain attention to task. Fine motor activities using large dry erase marker to copy within lines in all 4 quadrants with focus on grasp, R attention,  and increase smoothness. Cognitive and fine motor task playing "hang-man" on mirror. Pt needed mod cues to recall category of "animal" and maintain attention to task. Pt ambulated back to room with focus on R foot clearance and attention to R. Pt left seated EOB with bed alarm on and daughter present.   Therapy Documentation Precautions:  Precautions Precautions: Fall Restrictions Weight Bearing Restrictions: No Pain:  none/denies pain ADL: ADL ADL Comments: Please see functional navigator  See Function Navigator for Current Functional Status.  Therapy/Group: Individual Therapy  Valma Cava 12/19/2017, 2:41 PM

## 2017-12-19 NOTE — Progress Notes (Signed)
Speech Language Pathology Daily Session Note  Patient Details  Name: [name] MRN: [id number] Date of Birth: 1946-04-02  Today's Date: 12/19/2017 SLP Individual Time: 9528-4132 SLP Individual Time Calculation (min): 30 min  Short Term Goals: Week 1: SLP Short Term Goal 1 (Week 1): STGs=LTGs due to short length of stay   Skilled Therapeutic Interventions: Skilled treatment session focused on communication goals. SLP facilitated session by initiating education in regards to word-finding strategies. Patient participated in a generative naming task and required Mod-Max A verbal and semantic cues for word-finding and use of strategies. Patient left upright in bed with all needs within reach. Continue with current plan of care.      Function:  Cognition Comprehension Comprehension assist level: Understands basic 75 - 89% of the time/ requires cueing 10 - 24% of the time  Expression   Expression assist level: Expresses basic 50 - 74% of the time/requires cueing 25 - 49% of the time. Needs to repeat parts of sentences.  Social Interaction Social Interaction assist level: Interacts appropriately with others with medication or extra time (anti-anxiety, antidepressant).  Problem Solving Problem solving assist level: Solves basic 90% of the time/requires cueing < 10% of the time  Memory Memory assist level: Recognizes or recalls 75 - 89% of the time/requires cueing 10 - 24% of the time    Pain No/Denies Pain   Therapy/Group: Individual Therapy  [name] 12/19/2017, 1:39 PM

## 2017-12-20 ENCOUNTER — Inpatient Hospital Stay (HOSPITAL_COMMUNITY): Payer: Medicare Other | Admitting: Physical Therapy

## 2017-12-20 ENCOUNTER — Inpatient Hospital Stay (HOSPITAL_COMMUNITY): Payer: Medicare Other | Admitting: Occupational Therapy

## 2017-12-20 ENCOUNTER — Inpatient Hospital Stay (HOSPITAL_COMMUNITY): Payer: Medicare Other | Admitting: Speech Pathology

## 2017-12-20 LAB — BASIC METABOLIC PANEL
ANION GAP: 9 (ref 5–15)
BUN: 18 mg/dL (ref 6–20)
CHLORIDE: 106 mmol/L (ref 101–111)
CO2: 26 mmol/L (ref 22–32)
Calcium: 9 mg/dL (ref 8.9–10.3)
Creatinine, Ser: 0.64 mg/dL (ref 0.44–1.00)
GFR calc non Af Amer: >60 mL/min (ref >60)
Glucose, Bld: 100 mg/dL — ABNORMAL HIGH (ref 65–99)
POTASSIUM: 4.2 mmol/L (ref 3.5–5.1)
Sodium: 141 mmol/L (ref 135–145)

## 2017-12-20 NOTE — Progress Notes (Signed)
Occupational Therapy Session Note  Patient Details  Name: [name] MRN: [id number] Date of Birth: [birth year]  Today's Date: 12/20/2017 OT Individual Time: 0900-0940 OT Individual Time Calculation (min): 40 min   Short Term Goals: Week 1:  OT Short Term Goal 1 (Week 1): STG=LTG 2/2 ELOS  Skilled Therapeutic Interventions/Progress Updates:    Pt initially declined to participate in therapy, stating " I can't do it today." Pt states she did not sleep well and feels very out of it today. Pt declined to shower but agreed to change clothes. Pt very slow to initiate and needed CGA to maintain sitting balance when reaching outside base of support w/ lateral LOB to R. Increased time and min cues throughout dressing tasks. Pt reported need for bathroom and ambulated with min A to bathroom with min cues to avoid doorway on R side. Pt voided bladder and completed 3/3 toileting steps with supervision. Standing balance/endurance at the sink for hand washing task with min cues to locate paper towels on R side. Pt ambulated back to bed in similar fashion. Pt adamant she needed to rest and returned to supine. Lights turned off, bed alarm set, and needs met.    Therapy Documentation Precautions:  Precautions Precautions: Fall Restrictions Weight Bearing Restrictions: No Pain: Pain Assessment Pain Assessment: No/denies pain Pain Score: 0-No pain ADL: ADL ADL Comments: Please see functional navigator  See Function Navigator for Current Functional Status.   Therapy/Group: Individual Therapy  Valma Cava 12/20/2017, 9:34 AM

## 2017-12-20 NOTE — Progress Notes (Signed)
Speech Language Pathology Daily Session Note  Patient Details  Name: [name] MRN: [id number] Date of Birth: July 24, 1946  Today's Date: 12/20/2017 SLP Individual Time: 0815-0900 SLP Individual Time Calculation (min): 45 min  Short Term Goals: Week 1: SLP Short Term Goal 1 (Week 1): STGs=LTGs due to short length of stay   Skilled Therapeutic Interventions: Skilled treatment session focused on speech goals. Upon arrival, patient was asleep while supine in bed. Patient was easily awakened but required more than a reasonable amount of time for arousal which impacted her functional communication this session. Patient participated in a basic verbal description task with Max A multimodal cues and extra time at the phrase level. Patient also ambulated and from her room with Min A due to fatigue. Patient left sitting EOB with alarm on and all needs within reach. Continue with current plan of care.       Function:   Cognition Comprehension Comprehension assist level: Understands basic 75 - 89% of the time/ requires cueing 10 - 24% of the time  Expression   Expression assist level: Expresses basic 50 - 74% of the time/requires cueing 25 - 49% of the time. Needs to repeat parts of sentences.  Social Interaction Social Interaction assist level: Interacts appropriately with others with medication or extra time (anti-anxiety, antidepressant).  Problem Solving Problem solving assist level: Solves basic 90% of the time/requires cueing < 10% of the time  Memory Memory assist level: Recognizes or recalls 75 - 89% of the time/requires cueing 10 - 24% of the time    Pain Pain Assessment Pain Assessment: No/denies pain Pain Score: 0-No pain  Therapy/Group: Individual Therapy  [name] 12/20/2017, 10:51 AM

## 2017-12-20 NOTE — Progress Notes (Signed)
Physical Therapy Session Note  Patient Details  Name: [name] MRN: [id number] Date of Birth: Mar 01, 1946  Today's Date: 12/20/2017 PT Individual Time: 6728-9791 PT Individual Time Calculation (min): 25 min   Short Term Goals: Week 1:  PT Short Term Goal 1 (Week 1): Pt will complete BERG Balance Assessment PT Short Term Goal 2 (Week 1): Pt will recall 2 activities from previous therapy session with min cues PT Short Term Goal 3 (Week 1): Pt will negotiate 12 steps with 1 rail for balance and strengthenign   Skilled Therapeutic Interventions/Progress Updates:    c/o pain in L calf, does not rate.  Session focus on NMR.  Pt ambulates throughout session with close supervision, intermittent min guard with LOB.  Nustep x8 minutes with BLEs only at level 4 for reciprocal stepping pattern retraining, forced use, and activity tolerance.  PT applied theraband around RLE to facilitate DF, knee flexion, and hip flexion during gait with improved RLE clearance during swing phase.  Pt ambulates back to room with supervision, positioned EOB with call bell in reach and needs met.   Therapy Documentation Precautions:  Precautions Precautions: Fall Restrictions Weight Bearing Restrictions: No   See Function Navigator for Current Functional Status.   Therapy/Group: Individual Therapy  Michel Santee 12/20/2017, 4:31 PM

## 2017-12-20 NOTE — Progress Notes (Signed)
Physical Therapy Session Note  Patient Details  Name: Brittani Purdum MRN: [id number] Date of Birth: 25-Jun-1946  Today's Date: 12/20/2017 PT Individual Time: 1100-1200 PT Individual Time Calculation (min): 60 min   Short Term Goals: Week 1:  PT Short Term Goal 1 (Week 1): Pt will complete BERG Balance Assessment PT Short Term Goal 2 (Week 1): Pt will recall 2 activities from previous therapy session with min cues PT Short Term Goal 3 (Week 1): Pt will negotiate 12 steps with 1 rail for balance and strengthenign   Skilled Therapeutic Interventions/Progress Updates:    no c/o pain.  Session focus on attention to task, transfers, balance, and activity tolerance.    Pt transfers throughout session with supervision.  Gait throughout unit with supervision and intermittent min guard.    Kinetron in standing focus on L<>R weight shift and upright posture 3 trials to fatigue.  Progress to R weight shift reaching for objects with tactile cues for posture.  PT instructed pt in floor transfer to mat with mod assist to return to mat table 2/2 pain in knees.  While on floor mat, pt completes 2x10 reps bridges and 2x10 reps hip flexion in hook lying position.  Pt c/o pain in hamstrings.  PT provided PROM stretching to proximal and distal hamstrings 3x30 seconds each side followed by hamstring massage R and L.  Pt returned to room at end of session and positioned EOB with call bell in reach and needs met.   Therapy Documentation Precautions:  Precautions Precautions: Fall Restrictions Weight Bearing Restrictions: No   See Function Navigator for Current Functional Status.   Therapy/Group: Individual Therapy  Michel Santee 12/20/2017, 12:17 PM

## 2017-12-20 NOTE — Progress Notes (Signed)
Subjective/Complaints: Remains a phasic but is able to answer yes no type questions. Working with speech therapy on breakfast.   ROS-  Neg CP, SOB, N/V/D  Objective: Vital Signs: Blood pressure (!) 149/54, pulse (!) 56, temperature 97.6 F (36.4 C), temperature source Oral, resp. rate 16, height 5' 4"  (1.626 m), weight 60.8 kg (134 lb 0.6 oz), SpO2 93 %. No results found. Results for orders placed or performed during the hospital encounter of 12/17/17 (from the past 72 hour(s))  CBC     Status: None   Collection Time: 12/17/17  9:25 PM  Result Value Ref Range   WBC 5.2 4.0 - 10.5 K/uL   RBC 4.20 3.87 - 5.11 MIL/uL   Hemoglobin 12.6 12.0 - 15.0 g/dL   HCT 37.2 36.0 - 46.0 %   MCV 88.6 78.0 - 100.0 fL   MCH 30.0 26.0 - 34.0 pg   MCHC 33.9 30.0 - 36.0 g/dL   RDW 12.5 11.5 - 15.5 %   Platelets 218 150 - 400 K/uL  Creatinine, serum     Status: None   Collection Time: 12/17/17  9:25 PM  Result Value Ref Range   Creatinine, Ser 0.69 0.44 - 1.00 mg/dL   GFR calc non Af Amer >60 >60 mL/min   GFR calc Af Amer >60 >60 mL/min    Comment: (NOTE) The eGFR has been calculated using the CKD EPI equation. This calculation has not been validated in all clinical situations. eGFR's persistently <60 mL/min signify possible Chronic Kidney Disease.   CBC WITH DIFFERENTIAL     Status: None   Collection Time: 12/18/17  7:37 AM  Result Value Ref Range   WBC 4.5 4.0 - 10.5 K/uL   RBC 4.20 3.87 - 5.11 MIL/uL   Hemoglobin 12.5 12.0 - 15.0 g/dL   HCT 36.9 36.0 - 46.0 %   MCV 87.9 78.0 - 100.0 fL   MCH 29.8 26.0 - 34.0 pg   MCHC 33.9 30.0 - 36.0 g/dL   RDW 12.5 11.5 - 15.5 %   Platelets 204 150 - 400 K/uL   Neutrophils Relative % 60 %   Neutro Abs 2.6 1.7 - 7.7 K/uL   Lymphocytes Relative 25 %   Lymphs Abs 1.1 0.7 - 4.0 K/uL   Monocytes Relative 12 %   Monocytes Absolute 0.6 0.1 - 1.0 K/uL   Eosinophils Relative 3 %   Eosinophils Absolute 0.1 0.0 - 0.7 K/uL   Basophils Relative 0 %   Basophils Absolute 0.0 0.0 - 0.1 K/uL  Comprehensive metabolic panel     Status: Abnormal   Collection Time: 12/18/17  7:37 AM  Result Value Ref Range   Sodium 142 135 - 145 mmol/L   Potassium 3.1 (L) 3.5 - 5.1 mmol/L   Chloride 107 101 - 111 mmol/L   CO2 26 22 - 32 mmol/L   Glucose, Bld 133 (H) 65 - 99 mg/dL   BUN 19 6 - 20 mg/dL   Creatinine, Ser 0.63 0.44 - 1.00 mg/dL   Calcium 8.7 (L) 8.9 - 10.3 mg/dL   Total Protein 5.6 (L) 6.5 - 8.1 g/dL   Albumin 3.2 (L) 3.5 - 5.0 g/dL   AST 16 15 - 41 U/L   ALT 10 (L) 14 - 54 U/L   Alkaline Phosphatase 92 38 - 126 U/L   Total Bilirubin 0.6 0.3 - 1.2 mg/dL   GFR calc non Af Amer >60 >60 mL/min   GFR calc Af Amer >60 >60 mL/min  Comment: (NOTE) The eGFR has been calculated using the CKD EPI equation. This calculation has not been validated in all clinical situations. eGFR's persistently <60 mL/min signify possible Chronic Kidney Disease.    Anion gap 9 5 - 15     HEENT: normal Cardio: RRR and No murmur Resp: CTA B/L and unlabored GI: BS positive and NT, ND Extremity:  No Edema Skin:   Other IV site Left forearm CDI Neuro: Alert/Oriented, Abnormal Sensory difficult to assess secondary toaphasia , Abnormal Motor 3/5 R delt, bi, tri grip, 4/5 R HF, KE,ADF, 5/5 on left side, Abnormal FMC Ataxic/ dec FMC and Aphasic Musc/Skel:  Other no pain with ROM RUE or RLE Gen NAD   Assessment/Plan: 1. Functional deficits secondary to Right hemiparesis, aphasia  which require 3+ hours per day of interdisciplinary therapy in a comprehensive inpatient rehab setting. Physiatrist is providing close team supervision and 24 hour management of active medical problems listed below. Physiatrist and rehab team continue to assess barriers to discharge/monitor patient progress toward functional and medical goals. FIM: Function - Bathing Position: Shower Body parts bathed by patient: Right arm, Left arm, Chest, Abdomen, Front perineal area, Left upper leg,  Right upper leg Body parts bathed by helper: Buttocks, Left lower leg, Right lower leg Assist Level: Touching or steadying assistance(Pt > 75%)  Function- Upper Body Dressing/Undressing What is the patient wearing?: Pull over shirt/dress Pull over shirt/dress - Perfomed by patient: Thread/unthread right sleeve, Thread/unthread left sleeve, Put head through opening Pull over shirt/dress - Perfomed by helper: Pull shirt over trunk Assist Level: Touching or steadying assistance(Pt > 75%) Function - Lower Body Dressing/Undressing What is the patient wearing?: Pants, Non-skid slipper socks, Underwear Position: Sitting EOB Underwear - Performed by patient: Thread/unthread left underwear leg Underwear - Performed by helper: Thread/unthread right underwear leg, Pull underwear up/down Pants- Performed by patient: Thread/unthread left pants leg Pants- Performed by helper: Pull pants up/down, Thread/unthread right pants leg Non-skid slipper socks- Performed by patient: Don/doff right sock, Don/doff left sock Assist for footwear: Supervision/touching assist Assist for lower body dressing: Touching or steadying assistance (Pt > 75%)  Function - Toileting Toileting steps completed by patient: Adjust clothing prior to toileting, Performs perineal hygiene, Adjust clothing after toileting Toileting Assistive Devices: Grab bar or rail Assist level: Touching or steadying assistance (Pt.75%)  Function - Air cabin crew transfer assistive device: Grab bar Assist level to toilet: Touching or steadying assistance (Pt > 75%) Assist level from toilet: Touching or steadying assistance (Pt > 75%)  Function - Chair/bed transfer Chair/bed transfer method: Ambulatory Chair/bed transfer assist level: Touching or steadying assistance (Pt > 75%) Chair/bed transfer assistive device: Bedrails, Armrests Chair/bed transfer details: Verbal cues for precautions/safety  Function - Locomotion: Wheelchair Will  patient use wheelchair at discharge?: No Function - Locomotion: Ambulation Assistive device: No device Max distance: 150' Assist level: Touching or steadying assistance (Pt > 75%) Assist level: Touching or steadying assistance (Pt > 75%) Assist level: Touching or steadying assistance (Pt > 75%) Assist level: Touching or steadying assistance (Pt > 75%) Assist level: Touching or steadying assistance (Pt > 75%)  Function - Comprehension Comprehension: Auditory Comprehension assist level: Understands basic 75 - 89% of the time/ requires cueing 10 - 24% of the time  Function - Expression Expression: Verbal Expression assist level: Expresses basic 50 - 74% of the time/requires cueing 25 - 49% of the time. Needs to repeat parts of sentences.  Function - Social Interaction Social Interaction assist level: Interacts appropriately with  others with medication or extra time (anti-anxiety, antidepressant).  Function - Problem Solving Problem solving assist level: Solves basic 90% of the time/requires cueing < 10% of the time  Function - Memory Memory assist level: Recognizes or recalls 75 - 89% of the time/requires cueing 10 - 24% of the time Patient normally able to recall (first 3 days only): That he or she is in a hospital  Medical Problem List and Plan:  1. 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 DVT Prophylaxis/Anticoagulation: Subcutaneous Lovenox. Normal platelet counts and no signs of bleeding  3. Pain Management: Tylenol as needed  4. Mood: Provide emotional support  5. Neuropsych: This patient is capable of making decisions on her own behalf.  6. Skin/Wound Care: Routine skin checks  7. Fluids/Electrolytes/Nutrition: Routine I&O's , encourage p.o. fluid intake 8. Hyperlipidemia. Lipitor  9. Tobacco abuse. Counseling  10. History of endometrial  cancer status post hysterectomy  11.  Hypokalemia - on KCL 56mq BID, recheck basic metabolic package in the morning   LOS (Days) 3 A FACE TO FACE EVALUATION WAS PERFORMED  ACharlett Blake1/25/2019, 8:47 AM

## 2017-12-21 ENCOUNTER — Inpatient Hospital Stay (HOSPITAL_COMMUNITY): Payer: Medicare Other | Admitting: Occupational Therapy

## 2017-12-21 ENCOUNTER — Inpatient Hospital Stay (HOSPITAL_COMMUNITY): Payer: Medicare Other | Admitting: Physical Therapy

## 2017-12-21 ENCOUNTER — Inpatient Hospital Stay (HOSPITAL_COMMUNITY): Payer: Medicare Other

## 2017-12-21 DIAGNOSIS — R112 Nausea with vomiting, unspecified: Secondary | ICD-10-CM

## 2017-12-21 DIAGNOSIS — R197 Diarrhea, unspecified: Secondary | ICD-10-CM

## 2017-12-21 MED ORDER — LOPERAMIDE HCL 2 MG PO CAPS
2.0000 mg | ORAL_CAPSULE | ORAL | Status: DC | PRN
  Administered 2017-12-21: 2 mg via ORAL
  Filled 2017-12-21: qty 1

## 2017-12-21 MED ORDER — PROMETHAZINE HCL 25 MG PO TABS
25.0000 mg | ORAL_TABLET | Freq: Four times a day (QID) | ORAL | Status: DC | PRN
  Administered 2017-12-21 (×2): 25 mg via ORAL
  Filled 2017-12-21 (×2): qty 1

## 2017-12-21 NOTE — Progress Notes (Signed)
Speech Language Pathology Daily Session Note  Patient Details  Name: [name] MRN: [id number] Date of Birth: Sep 19, 1946  Today's Date: 12/21/2017 SLP Individual Time: 4827-0786 SLP Individual Time Calculation (min): 30 min  Short Term Goals: Week 1: SLP Short Term Goal 1 (Week 1): STGs=LTGs due to short length of stay   Skilled Therapeutic Interventions: Skilled ST services focused on speech skills. Pt has recently thrown up before SLP entered room and was laying in bed. PT stated she did not feel well, but agreed to work with SLP. SLP facilitated expressive naming given simple divergent and convergent naming task required Max A verbal cues, and given category naming with phonemic cues moderate impairment. Pt required max a verbal cues for sustained attention, closing eyes and to continue participation in skilled ST services. Pt was left in room with daughter. continue skilled ST services.      Function:  Eating Eating                 Cognition Comprehension Comprehension assist level: Understands basic 75 - 89% of the time/ requires cueing 10 - 24% of the time  Expression   Expression assist level: Expresses basic 50 - 74% of the time/requires cueing 25 - 49% of the time. Needs to repeat parts of sentences.;Expresses basic 25 - 49% of the time/requires cueing 50 - 75% of the time. Uses single words/gestures.  Social Interaction Social Interaction assist level: Interacts appropriately 75 - 89% of the time - Needs redirection for appropriate language or to initiate interaction.  Problem Solving Problem solving assist level: Solves basic 50 - 74% of the time/requires cueing 25 - 49% of the time  Memory Memory assist level: Recognizes or recalls 90% of the time/requires cueing < 10% of the time    Pain Pain Assessment Pain Assessment: No/denies pain  Therapy/Group: Individual Therapy  [name]  [facility] 12/21/2017, 5:54 PM

## 2017-12-21 NOTE — Progress Notes (Signed)
[name] is a 72 y.o. female 1946-01-18 [id number]  Subjective: Nausea and vomiting this morning, followed by liquid diarrhea - given antidiarrheal and antiemesis meds and now resting - Denies cramping, abdominal pain or current nausea. Ate boiled egg and tol liquid sips ok since nausea and vomiting   Objective: Vital signs in last 24 hours: Temp:  [98.2 F (36.8 C)-98.5 F (36.9 C)] 98.5 F (36.9 C) (01/26 0507) Pulse Rate:  [61-72] 72 (01/26 0507) Resp:  [18] 18 (01/26 0507) BP: (114-145)/(48-61) 114/48 (01/26 0507) SpO2:  [95 %-98 %] 95 % (01/26 0507) Weight change:  Last BM Date: 12/21/17  Intake/Output from previous day: 01/25 0701 - 01/26 0700 In: 720 [P.O.:720] Out: -   Physical Exam General: No apparent distress   Resting supine. Dtr at side Lungs: Normal effort. Lungs clear to auscultation, no crackles or wheezes. Cardiovascular: Regular rate and rhythm, no edema Abdominal: soft, NTND, BS present, no R/G Neurological: No new neurological deficits   Lab Results: BMET    Component Value Date/Time   NA 141 12/20/2017 0849   K 4.2 12/20/2017 0849   CL 106 12/20/2017 0849   CO2 26 12/20/2017 0849   GLUCOSE 100 (H) 12/20/2017 0849   BUN 18 12/20/2017 0849   CREATININE 0.64 12/20/2017 0849   CALCIUM 9.0 12/20/2017 0849   GFRNONAA >60 12/20/2017 0849   GFRAA >60 12/20/2017 0849   CBC    Component Value Date/Time   WBC 4.5 12/18/2017 0737   RBC 4.20 12/18/2017 0737   HGB 12.5 12/18/2017 0737   HCT 36.9 12/18/2017 0737   PLT 204 12/18/2017 0737   MCV 87.9 12/18/2017 0737   MCH 29.8 12/18/2017 0737   MCHC 33.9 12/18/2017 0737   RDW 12.5 12/18/2017 0737   LYMPHSABS 1.1 12/18/2017 0737   MONOABS 0.6 12/18/2017 0737   EOSABS 0.1 12/18/2017 0737   BASOSABS 0.0 12/18/2017 0737   CBG's (last 3):  No results for input(s): GLUCAP in the last 72 hours. LFT's Lab Results  Component Value Date   ALT 10 (L) 12/18/2017   AST 16 12/18/2017   ALKPHOS 92  12/18/2017   BILITOT 0.6 12/18/2017    Studies/Results: No results found.  Medications:  I have reviewed the patient's current medications. Scheduled Medications: . aspirin  300 mg Rectal Daily   Or  . aspirin  325 mg Oral Daily  . atorvastatin  80 mg Oral q1800  . brinzolamide  1 drop Right Eye BID  . enoxaparin (LOVENOX) injection  40 mg Subcutaneous Daily  . latanoprost  1 drop Right Eye QHS  . multivitamin with minerals  1 tablet Oral Daily   PRN Medications: acetaminophen **OR** acetaminophen (TYLENOL) oral liquid 160 mg/5 mL **OR** acetaminophen, loperamide, ondansetron **OR** ondansetron (ZOFRAN) IV, senna-docusate, sorbitol  Assessment/Plan: Active Problems:   Left basal ganglia embolic stroke (HCC)   Length of stay, days: 4  1. L BG CVA with aphasia and R HP -continue IP rehab therapies as ongoing 2. hypokalemia - resolved 3. Hyperlipidemia - continue statin 4. Acute nausea and vomiting with diarrhea - ? Viral gastroenteritis, med side effects or other - Abd exam and VS ok - symptoms relieved with AM meds and currently no distress - continue obs and follow up evaluation as indicated  Loyalty Brashier A. Asa Lente, MD 12/21/2017, 11:55 AM

## 2017-12-21 NOTE — Progress Notes (Signed)
Pt has had GI symptoms that have limited her participation today. Pt had one episode of diarrhea this am at 8 am which was relieved by imodium. Pt had one episode of green emesis this morning. Pt was given prn Zofran and pt continued to have 4-5  More episodes of emesis after administration of Zofran. MD on call ordered po phenergan  Which appears to have eased patients symptoms. Pt has been resting this afternoon and has not felt well enough to participate in her therapies. Pt encouraged to take ice chips and stick with bland foods to help ease symptoms. Will continue to monitor.

## 2017-12-21 NOTE — Progress Notes (Signed)
Physical Therapy Session Note  Patient Details  Name: [name] MRN: [id number] Date of Birth: [birth year]  Today's Date: 12/21/2017 PT Missed Time: 45 Minutes Missed Time Reason: Patient ill (Comment)(pt w/ multiple episodes of emesis and diarrhea today, requesting to rest)  Pt and RN reporting multiple bouts of emesis and diarrhea today, pt requesting to rest.   Chauntay Paszkiewicz K Arnette 12/21/2017, 2:28 PM

## 2017-12-21 NOTE — Progress Notes (Signed)
Occupational Therapy Note  Patient Details  Name: [name] MRN: [id number] Date of Birth: 02-10-1946  Today's Date: 12/21/2017   Attempted to see pt for OT treatment this AM, however pt experienced episode of n/v during earlier OT session.  Pt resting upon OT arrival but aroused easily, declined participation stating she still felt ill and needed to rest.  Marcella Dubs 12/21/2017, 10:12 AM

## 2017-12-22 ENCOUNTER — Inpatient Hospital Stay (HOSPITAL_COMMUNITY): Payer: Medicare Other | Admitting: Physical Therapy

## 2017-12-22 NOTE — Progress Notes (Signed)
Occupational Therapy Session Note  Patient Details  Name: [name] MRN: [id number] Date of Birth: February 11, 1946  Today's Date:12/21/2017 OT Individual Time:  - 7:30-745 Total minutes=75     Skilled Therapeutic Interventions/Progress Updates: patient stated she was fatigued and had not slept but concurred to work on bathing and dressing.     Unfortunately, she became Nauseated and vomited after she sat edge of bed and took meds from RN.     She completed toileting (close S) and pericleansing and dressing (UB= setup and LB dressing = close S)     Otherwise, she tended to exhibit right lateral leaning while unsupported edge of bed and on toilet.  She requested to ly down at end of session to recover from the nausea and vomiting and c/o of feeling sick.  Supportive daughter in room during the session.     Therapy Documentation Precautions:  Precautions Precautions: Fall Restrictions Weight Bearing Restrictions: No Pain:denied but presented with N & V (RN attended to patient for care)   See Function Navigator for Current Functional Status.   Therapy/Group: Individual Therapy  [name] [facility] 12/22/2017, 1:05 PM

## 2017-12-22 NOTE — Progress Notes (Signed)
Physical Therapy Note  Patient Details  Name: [name] MRN: [id number] Date of Birth: 1946-01-28 Today's Date: 12/22/2017    Time: 1300-1341 41 minutes  1:1 No c/o pain. Pt states she feels much better than yesterday.  Gait in room to bathroom with min guard, 1 instance of LOB to Lt requiring mod A to correct.  Toilet transfers with close supervision for balance, supervision for clothing management.  Gait throughout unit with min A, mod A when LOB especially when turning head to look at distractions.  Gait with ball toss with min A.  Gait with obstacle course with min A, max cuing to avoid object when attempting to step over object.  Pt with continued c/o dizziness with turning head Rt.  Head thrust and head rotation tests negative.  Pt positive for dizziness with gaze stabilization tasks. Pt given HEP and reviewed HEP with family for gaze stabilization with lateral and vertical head turns.  Standing balance with step ups all directions with min/mod A.  kinetron in standing 3 x 1 minute for LE strength and balance training. Pt left in room with family present.   DONAWERTH,KAREN 12/22/2017, 1:42 PM

## 2017-12-22 NOTE — Progress Notes (Signed)
[name] is a 72 y.o. female 13-Jun-1946 [id number]  Subjective: Yesterday's GI events reviewed.  Reports feeling 100% better today without nausea.  No loose bowels or other diarrhea problems since yesterday morning.  No vomiting since yesterday afternoon.  Objective: Vital signs in last 24 hours: Temp:  [98 F (36.7 C)-98.2 F (36.8 C)] 98.2 F (36.8 C) (01/27 0448) Pulse Rate:  [59-72] 72 (01/27 0448) Resp:  [16] 16 (01/27 0448) BP: (119-120)/(43-49) 119/43 (01/27 0448) SpO2:  [96 %-97 %] 97 % (01/27 0448) Weight change:  Last BM Date: 12/21/17  Intake/Output from previous day: No intake/output data recorded.  Physical Exam General: No apparent distress   awake alert and more interactive than yesterday.  Daughter at bedside. Lungs: Normal effort. Lungs clear to auscultation, no crackles or wheezes. Cardiovascular: Regular rate and rhythm, no edema Abdominal: Soft, nontender, positive bowel sounds throughout.  No rebound or guarding. Neurological: No new neurological deficits   Lab Results: BMET    Component Value Date/Time   NA 141 12/20/2017 0849   K 4.2 12/20/2017 0849   CL 106 12/20/2017 0849   CO2 26 12/20/2017 0849   GLUCOSE 100 (H) 12/20/2017 0849   BUN 18 12/20/2017 0849   CREATININE 0.64 12/20/2017 0849   CALCIUM 9.0 12/20/2017 0849   GFRNONAA >60 12/20/2017 0849   GFRAA >60 12/20/2017 0849   CBC    Component Value Date/Time   WBC 4.5 12/18/2017 0737   RBC 4.20 12/18/2017 0737   HGB 12.5 12/18/2017 0737   HCT 36.9 12/18/2017 0737   PLT 204 12/18/2017 0737   MCV 87.9 12/18/2017 0737   MCH 29.8 12/18/2017 0737   MCHC 33.9 12/18/2017 0737   RDW 12.5 12/18/2017 0737   LYMPHSABS 1.1 12/18/2017 0737   MONOABS 0.6 12/18/2017 0737   EOSABS 0.1 12/18/2017 0737   BASOSABS 0.0 12/18/2017 0737   CBG's (last 3):  No results for input(s): GLUCAP in the last 72 hours. LFT's Lab Results  Component Value Date   ALT 10 (L) 12/18/2017   AST 16 12/18/2017   ALKPHOS 92 12/18/2017   BILITOT 0.6 12/18/2017    Studies/Results: No results found.  Medications:  I have reviewed the patient's current medications. Scheduled Medications: . aspirin  300 mg Rectal Daily   Or  . aspirin  325 mg Oral Daily  . atorvastatin  80 mg Oral q1800  . brinzolamide  1 drop Right Eye BID  . enoxaparin (LOVENOX) injection  40 mg Subcutaneous Daily  . latanoprost  1 drop Right Eye QHS  . multivitamin with minerals  1 tablet Oral Daily   PRN Medications: acetaminophen **OR** acetaminophen (TYLENOL) oral liquid 160 mg/5 mL **OR** acetaminophen, loperamide, ondansetron **OR** ondansetron (ZOFRAN) IV, promethazine, senna-docusate, sorbitol  Assessment/Plan: Active Problems:   Left basal ganglia embolic stroke ([place])  1.  Left basal ganglia stroke with aphasia and right hemiparesis.  Continue CIR therapy as ongoing. 2.  Nausea vomiting and diarrhea times 24 hours, symptoms now appear resolved.  No events or episodes overnight.  Gently advance bland diet as symptoms tolerate.  No other diagnostic evaluation at this time indicated. 3.  Hypokalemia, recheck BMet in morning. 4.  Hyperlipidemia.  Continue statin  Length of stay, days: 5  Mihail Prettyman A. Asa Lente, MD 12/22/2017, 9:59 AM

## 2017-12-23 ENCOUNTER — Inpatient Hospital Stay (HOSPITAL_COMMUNITY): Payer: Medicare Other | Admitting: Physical Therapy

## 2017-12-23 ENCOUNTER — Inpatient Hospital Stay (HOSPITAL_COMMUNITY): Payer: Medicare Other | Admitting: Speech Pathology

## 2017-12-23 ENCOUNTER — Inpatient Hospital Stay (HOSPITAL_COMMUNITY): Payer: Medicare Other | Admitting: Occupational Therapy

## 2017-12-23 NOTE — Progress Notes (Signed)
Speech Language Pathology Daily Session Note  Patient Details  Name: [name] MRN: [id number] Date of Birth: Apr 20, 1946  Today's Date: 12/23/2017 SLP Individual Time: 1100-1200 SLP Individual Time Calculation (min): 60 min  Short Term Goals: Week 2: SLP Short Term Goal 2 (Week 2): STGs=LTGs due to short length of stay    Skilled Therapeutic Interventions: Skilled treatment session focused on communication goals. SLP facilitated session by providing Min A verbal cues for word finding and use of strategies. Patient required Min A verbal cues for selective attention in a slightly distractive environment for ~50 minutes. Patient left sitting EOB with alarm on and all needs within reach. Continue with current plan of care.     Function:  Cognition Comprehension Comprehension assist level: Follows basic conversation/direction with no assist  Expression   Expression assist level: Expresses basic 75 - 89% of the time/requires cueing 10 - 24% of the time. Needs helper to occlude trach/needs to repeat words.  Social Interaction Social Interaction assist level: Interacts appropriately 90% of the time - Needs monitoring or encouragement for participation or interaction.  Problem Solving Problem solving assist level: Solves basic 90% of the time/requires cueing < 10% of the time  Memory Memory assist level: Recognizes or recalls 90% of the time/requires cueing < 10% of the time    Pain Pain Assessment Pain Assessment: No/denies pain  Therapy/Group: Individual Therapy  [name]  SLP - Student 12/23/2017, 12:58 PM

## 2017-12-23 NOTE — Progress Notes (Signed)
Occupational Therapy Session Note  Patient Details  Name: Christeena Krogh MRN: [id number] Date of Birth: 1946/06/20  Today's Date: 12/23/2017 OT Individual Time: 1000-1100 OT Individual Time Calculation (min): 60 min   Short Term Goals: Week 1:  OT Short Term Goal 1 (Week 1): STG=LTG 2/2 ELOS  Skilled Therapeutic Interventions/Progress Updates:    OT treatment session focused on modified bathing/dressing, attention, safety awareness. Pt ambulated to bathroom with close supervision and min cues to avoid doorway on R side. Pt with successful small BM and able to complete 3/3 toileting steps w/ supervision. Bathing shower level with set-up A and supervision for balance. Pt request to wait and wash hair at the sink instead of the shower. Pt states that is how she does it at home. Pt stood at the sink with her head bent forward under the faucet with overall supervision for balance. B UE strength/coordination and standing endurance with hair drying task. Pt left seated EOB at end of session with bed alarm on and needs met.   Therapy Documentation Precautions:  Precautions Precautions: Fall Restrictions Weight Bearing Restrictions: No Pain:  none/denies pain ADL: ADL ADL Comments: Please see functional navigator  See Function Navigator for Current Functional Status.   Therapy/Group: Individual Therapy  Valma Cava 12/23/2017, 10:17 AM

## 2017-12-23 NOTE — Progress Notes (Signed)
Physical Therapy Session Note  Patient Details  Name: [name] MRN: [id number] Date of Birth: [birth year]  Today's Date: 12/23/2017 PT Individual Time: 1430-1530 PT Individual Time Calculation (min): 60 min   Short Term Goals: Week 1:  PT Short Term Goal 1 (Week 1): Pt will complete BERG Balance Assessment PT Short Term Goal 2 (Week 1): Pt will recall 2 activities from previous therapy session with min cues PT Short Term Goal 3 (Week 1): Pt will negotiate 12 steps with 1 rail for balance and strengthenign   Skilled Therapeutic Interventions/Progress Updates: Pt presented in bed with dgt present agreeable to therapy. Pt ambulated to [facility] gym supervision with cues for improving R foot clearance. Pt participated in Dynavision mode B. Pt performed with RUE only while standing on Airex and able to hit target approx 80% of attempts. Pt noted to increase posterior lean with fatigue with x 1 LOB requiring PTA assistance for recovery. Attempted with additional task of adding 3 number sequencing however pt unable to perform successfully. Pt ambulated to rehab gym and participated in game of Connect Four while standing on red wedge with PTA providing intermittent multimodal cues for correction. Also participated in alternating toe taps for wt shifting and R NMR. Pt ambulated back to room and returned to bed. Pt left in bed with alarm on and dgt present with needs met.       Therapy Documentation Precautions:  Precautions Precautions: Fall Restrictions Weight Bearing Restrictions: No General:   Vital Signs: Therapy Vitals Temp: 98.4 F (36.9 C) Temp Source: Oral Pulse Rate: 67 Resp: 18 BP: (!) 119/39 Patient Position (if appropriate): Sitting Oxygen Therapy SpO2: 96 % O2 Device: Not Delivered  See Function Navigator for Current Functional Status.   Therapy/Group: Individual Therapy  Nemiah Kissner  Demetric Parslow, PTA  12/23/2017, 4:03 PM

## 2017-12-23 NOTE — Progress Notes (Signed)
Subjective/Complaints: Patient without complaints today.  Has used steps in therapy.  3 steps to enter at home.  Has not tried car transfers yet   ROS-  Neg CP, SOB, N/V/D  Objective: Vital Signs: Blood pressure (!) 117/46, pulse (!) 59, temperature 97.6 F (36.4 C), temperature source Oral, resp. rate 16, height 5' 4"  (1.626 m), weight 60.8 kg (134 lb 0.6 oz), SpO2 93 %. No results found. Results for orders placed or performed during the hospital encounter of 12/17/17 (from the past 72 hour(s))  Basic metabolic panel     Status: Abnormal   Collection Time: 12/20/17  8:49 AM  Result Value Ref Range   Sodium 141 135 - 145 mmol/L   Potassium 4.2 3.5 - 5.1 mmol/L   Chloride 106 101 - 111 mmol/L   CO2 26 22 - 32 mmol/L   Glucose, Bld 100 (H) 65 - 99 mg/dL   BUN 18 6 - 20 mg/dL   Creatinine, Ser 0.64 0.44 - 1.00 mg/dL   Calcium 9.0 8.9 - 10.3 mg/dL   GFR calc non Af Amer >60 >60 mL/min   GFR calc Af Amer >60 >60 mL/min    Comment: (NOTE) The eGFR has been calculated using the CKD EPI equation. This calculation has not been validated in all clinical situations. eGFR's persistently <60 mL/min signify possible Chronic Kidney Disease.    Anion gap 9 5 - 15     HEENT: normal Cardio: RRR and No murmur Resp: CTA B/L and unlabored GI: BS positive and NT, ND Extremity:  No Edema Skin:   Other IV site Left forearm CDI Neuro: Alert/Oriented, Abnormal Sensory difficult to assess secondary toaphasia , Abnormal Motor 3/5 R delt, bi, tri grip, 4/5 R HF, KE,ADF, 5/5 on left side, Abnormal FMC Ataxic/ dec FMC and Aphasic Musc/Skel:  Other no pain with ROM RUE or RLE Gen NAD   Assessment/Plan: 1. Functional deficits secondary to Right hemiparesis, aphasia  which require 3+ hours per day of interdisciplinary therapy in a comprehensive inpatient rehab setting. Physiatrist is providing close team supervision and 24 hour management of active medical problems listed below. Physiatrist and rehab  team continue to assess barriers to discharge/monitor patient progress toward functional and medical goals. FIM: Function - Bathing Position: Shower Body parts bathed by patient: Right arm, Left arm, Chest, Abdomen, Front perineal area, Left upper leg, Right upper leg Body parts bathed by helper: Buttocks, Left lower leg, Right lower leg Assist Level: Touching or steadying assistance(Pt > 75%)(70%, moderate assist FIM level)  Function- Upper Body Dressing/Undressing What is the patient wearing?: Pull over shirt/dress Pull over shirt/dress - Perfomed by patient: Thread/unthread right sleeve, Pull shirt over trunk, Put head through opening, Thread/unthread left sleeve Pull over shirt/dress - Perfomed by helper: Pull shirt over trunk Assist Level: Supervision or verbal cues Function - Lower Body Dressing/Undressing What is the patient wearing?: Pants, Non-skid slipper socks Position: Sitting EOB Underwear - Performed by patient: Thread/unthread right underwear leg, Thread/unthread left underwear leg, Pull underwear up/down Underwear - Performed by helper: Thread/unthread right underwear leg, Pull underwear up/down Pants- Performed by patient: Thread/unthread right pants leg, Thread/unthread left pants leg, Pull pants up/down Pants- Performed by helper: Pull pants up/down, Thread/unthread right pants leg Non-skid slipper socks- Performed by patient: Don/doff right sock, Don/doff left sock Assist for footwear: Supervision/touching assist Assist for lower body dressing: Supervision or verbal cues, Set up  Function - Toileting Toileting steps completed by patient: Performs perineal hygiene Toileting steps completed by helper:  Adjust clothing prior to toileting, Adjust clothing after toileting Toileting Assistive Devices: Grab bar or rail Assist level: Touching or steadying assistance (Pt.75%)  Function - Toilet Transfers Toilet transfer assistive device: Grab bar Assist level to toilet:  Touching or steadying assistance (Pt > 75%) Assist level from toilet: Touching or steadying assistance (Pt > 75%)  Function - Chair/bed transfer Chair/bed transfer method: Ambulatory Chair/bed transfer assist level: Touching or steadying assistance (Pt > 75%) Chair/bed transfer assistive device: Bedrails, Armrests Chair/bed transfer details: Verbal cues for precautions/safety  Function - Locomotion: Wheelchair Will patient use wheelchair at discharge?: No Function - Locomotion: Ambulation Assistive device: No device Max distance: 150' Assist level: Touching or steadying assistance (Pt > 75%) Assist level: Touching or steadying assistance (Pt > 75%) Assist level: Touching or steadying assistance (Pt > 75%) Assist level: Touching or steadying assistance (Pt > 75%) Assist level: Touching or steadying assistance (Pt > 75%)  Function - Comprehension Comprehension: Auditory Comprehension assist level: Follows basic conversation/direction with no assist  Function - Expression Expression: Verbal Expression assist level: Expresses basic needs/ideas: With no assist  Function - Social Interaction Social Interaction assist level: Interacts appropriately 90% of the time - Needs monitoring or encouragement for participation or interaction.  Function - Problem Solving Problem solving assist level: Solves basic problems with no assist  Function - Memory Memory assist level: Recognizes or recalls 90% of the time/requires cueing < 10% of the time Patient normally able to recall (first 3 days only): That he or she is in a hospital, Location of own room, Current season  Medical Problem List and Plan:  1. 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 DVT Prophylaxis/Anticoagulation: Subcutaneous Lovenox. Normal platelet counts and no signs of bleeding  3. Pain  Management: Tylenol as needed  4. Mood: Provide emotional support  5. Neuropsych: This patient is capable of making decisions on her own behalf.  6. Skin/Wound Care: Routine skin checks  7. Fluids/Electrolytes/Nutrition: Routine I&O's , fluid intake limited to 480 mL on 12/22/2017 encourage p.o. fluid intake 8. Hyperlipidemia. Lipitor  9. Tobacco abuse. Counseling  10. History of endometrial cancer status post hysterectomy  11.  Hypokalemia - on KCL 21mq BID, improved as of 12/20/2017   LOS (Days) 6 A FACE TO FACE EVALUATION WAS PERFORMED  ACharlett Blake1/28/2019, 8:35 AM

## 2017-12-23 NOTE — Progress Notes (Signed)
Physical Therapy Session Note  Patient Details  Name: [name] MRN: [id number] Date of Birth: Jun 10, 1946  Today's Date: 12/23/2017 PT Individual Time: 1300-1330 PT Individual Time Calculation (min): 30 min   Short Term Goals: Week 1:  PT Short Term Goal 1 (Week 1): Pt will complete BERG Balance Assessment PT Short Term Goal 2 (Week 1): Pt will recall 2 activities from previous therapy session with min cues PT Short Term Goal 3 (Week 1): Pt will negotiate 12 steps with 1 rail for balance and strengthenign   Skilled Therapeutic Interventions/Progress Updates:    no c/o pain.  Session focus on dynamic balance during gait.  Pt ambulates with supervision in controlled environment, occasional cues for attention to R.  Gait within room with supervision and toileting with set up assist.  High level gait with ball toss/bounce with close supervision, intermittent min guard for LOB to R/L, and gait kicking yoga block with min guard and occasional mod assist for LOB with increased cues for problem solving how to safely get to yoga block to kick it.  Pt returned to room at end of session, call bell in reach and needs met.   Therapy Documentation Precautions:  Precautions Precautions: Fall Restrictions Weight Bearing Restrictions: No  See Function Navigator for Current Functional Status.   Therapy/Group: Individual Therapy  Michel Santee 12/23/2017, 1:25 PM

## 2017-12-23 NOTE — Progress Notes (Signed)
Social Work Patient ID: [name], female   DOB: 07/16/1946, 72 y.o.   MRN: [id number]  Met with pt and her daughter who reports they prefer [facility] since pt has had them before after her hip replacement. Pt voiced she is doing better today she had the stomach virus over the weekend and that was not fun. Daughter is here to observe in therapies. Will work toward discharge Thursday.

## 2017-12-24 ENCOUNTER — Inpatient Hospital Stay (HOSPITAL_COMMUNITY): Payer: Medicare Other | Admitting: Physical Therapy

## 2017-12-24 ENCOUNTER — Inpatient Hospital Stay (HOSPITAL_COMMUNITY): Payer: Medicare Other | Admitting: Occupational Therapy

## 2017-12-24 ENCOUNTER — Inpatient Hospital Stay (HOSPITAL_COMMUNITY): Payer: Medicare Other | Admitting: Speech Pathology

## 2017-12-24 DIAGNOSIS — R269 Unspecified abnormalities of gait and mobility: Secondary | ICD-10-CM

## 2017-12-24 DIAGNOSIS — I69398 Other sequelae of cerebral infarction: Secondary | ICD-10-CM

## 2017-12-24 DIAGNOSIS — G8191 Hemiplegia, unspecified affecting right dominant side: Secondary | ICD-10-CM

## 2017-12-24 LAB — CREATININE, SERUM
CREATININE: 0.6 mg/dL (ref 0.44–1.00)
GFR calc Af Amer: >60 mL/min (ref >60)
GFR calc non Af Amer: >60 mL/min (ref >60)

## 2017-12-24 NOTE — Progress Notes (Signed)
Occupational Therapy Session Note  Patient Details  Name: [name] MRN: [id number] Date of Birth: 30-Nov-1945  Today's Date: 12/24/2017   Session 1 OT Individual Time: 0931-1216 OT Individual Time Calculation (min): 74 min   Session 2 OT Individual Time: 1445-1530 OT Individual Time Calculation (min): 45 min    Short Term Goals: Week 2:   LTG=STG 2/2 ELOS  Skilled Therapeutic Interventions/Progress Updates:  Session 1   OT treatment session focused on UB there-ex, balance, R NMR, and R side attention. Pt donned shoes at EOB with set-up A. Pt then ambulated to dayroom with min cues to avoid objects on R side. Pt completed 15 mins on Sci Fit arm bike with 2 rest breaks at 5 minute intervals and RPMs above 20. Pt ambulated with supervision to therapy apartment and practiced walk-in shower transfer in simulated home environment with supervision. Pt reports she already has a shower chair she can place in there for safety. OT reviewed safety precautions within kitchen environment and pt completed kitchen scavenger hunt to practice collecting items for upper and lower cabinets. UB there-ex with 1LB weight on R UE while maintaining grasp on pole to hit beach ball. Pt needed cues initially to facilitate full press. Pt ambulated back towards room and was left in care of nurse.  Session 2  Pt ambulated to therapy gym with close supervision and pushed TV cart to ortho gym. Worked on dynamic balance, standing endurance, and hane-eye coordination with Wii bowling activity. Pt then completed 3 sets of 15 straight arm raises, bicep curls, and chest press with 1 lb weighted bar. Pt returned to room in similar fashion and left seated EOB wiith bed alarm on and needs met. Daughter present.   Therapy Documentation Precautions:  Precautions Precautions: Fall Restrictions Weight Bearing Restrictions: No Pain:  none/denies pain ADL: ADL ADL Comments: Please see functional navigator  See Function  Navigator for Current Functional Status.   Therapy/Group: Individual Therapy  Valma Cava 12/24/2017, 3:31 PM

## 2017-12-24 NOTE — Progress Notes (Signed)
Speech Language Pathology Daily Session Note  Patient Details  Name: [name] MRN: [id number] Date of Birth: Aug 21, 1946  Today's Date: 12/24/2017 SLP Individual Time: 1400-1430 SLP Individual Time Calculation (min): 30 min  Short Term Goals: Week 1: SLP Short Term Goal 1 (Week 1): STGs=LTGs due to short length of stay   Skilled Therapeutic Interventions: Skilled treatment session focused on communication goals. SLP facilitated session by providing extra time and Min A verbal cues for word-finding during a verbal description task at the phrase and sentence level. Patient was Mod I for verbal expression during an informal conversation. Patient left EOB with NT present. Continue with current plan of care.   Function:   Cognition Comprehension Comprehension assist level: Follows basic conversation/direction with extra time/assistive device  Expression   Expression assist level: Expresses basic 75 - 89% of the time/requires cueing 10 - 24% of the time. Needs helper to occlude trach/needs to repeat words.  Social Interaction Social Interaction assist level: Interacts appropriately 90% of the time - Needs monitoring or encouragement for participation or interaction.  Problem Solving Problem solving assist level: Solves basic 90% of the time/requires cueing < 10% of the time  Memory Memory assist level: Recognizes or recalls 75 - 89% of the time/requires cueing 10 - 24% of the time    Pain Pain Assessment Pain Assessment: No/denies pain  Therapy/Group: Individual Therapy  [name] 12/24/2017, 2:31 PM

## 2017-12-24 NOTE — Progress Notes (Signed)
Subjective/Complaints: Plan discharge in a.m. Patient without complaints today working on floor transfers with PT  ROS-  Neg CP, SOB, N/V/D  Objective: Vital Signs: Blood pressure (!) 133/41, pulse 63, temperature 97.9 F (36.6 C), temperature source Oral, resp. rate 15, height 5' 4"  (1.626 m), weight 60.8 kg (134 lb 0.6 oz), SpO2 94 %. No results found. Results for orders placed or performed during the hospital encounter of 12/17/17 (from the past 72 hour(s))  Creatinine, serum     Status: None   Collection Time: 12/24/17  5:42 AM  Result Value Ref Range   Creatinine, Ser 0.60 0.44 - 1.00 mg/dL   GFR calc non Af Amer >60 >60 mL/min   GFR calc Af Amer >60 >60 mL/min    Comment: (NOTE) The eGFR has been calculated using the CKD EPI equation. This calculation has not been validated in all clinical situations. eGFR's persistently <60 mL/min signify possible Chronic Kidney Disease.      HEENT: normal Cardio: RRR and No murmur Resp: CTA B/L and unlabored GI: BS positive and NT, ND Extremity:  No Edema Skin:   Other IV site Left forearm CDI Neuro: Alert/Oriented, Abnormal Sensory difficult to assess secondary toaphasia , Abnormal Motor 4/5 R delt, bi, tri grip, 4/5 R HF, KE,ADF, 5/5 on left side, Abnormal FMC Ataxic/ dec FMC and Aphasic Musc/Skel:  Other no pain with ROM RUE or RLE Gen NAD   Assessment/Plan: 1. Functional deficits secondary to Right hemiparesis, aphasia  which require 3+ hours per day of interdisciplinary therapy in a comprehensive inpatient rehab setting. Physiatrist is providing close team supervision and 24 hour management of active medical problems listed below. Physiatrist and rehab team continue to assess barriers to discharge/monitor patient progress toward functional and medical goals. FIM: Function - Bathing Position: Shower Body parts bathed by patient: Right arm, Left arm, Chest, Abdomen, Front perineal area, Buttocks, Right upper leg, Left upper  leg, Right lower leg, Left lower leg Body parts bathed by helper: Buttocks, Left lower leg, Right lower leg Assist Level: Supervision or verbal cues  Function- Upper Body Dressing/Undressing What is the patient wearing?: Pull over shirt/dress Pull over shirt/dress - Perfomed by patient: Thread/unthread right sleeve, Thread/unthread left sleeve, Put head through opening, Pull shirt over trunk Pull over shirt/dress - Perfomed by helper: Pull shirt over trunk Assist Level: Touching or steadying assistance(Pt > 75%) Function - Lower Body Dressing/Undressing What is the patient wearing?: Pants, Non-skid slipper socks, Underwear Position: Wheelchair/chair at sink Underwear - Performed by patient: Thread/unthread right underwear leg, Thread/unthread left underwear leg, Pull underwear up/down Underwear - Performed by helper: Thread/unthread right underwear leg, Pull underwear up/down Pants- Performed by patient: Thread/unthread left pants leg, Thread/unthread right pants leg, Pull pants up/down Pants- Performed by helper: Pull pants up/down, Thread/unthread right pants leg Non-skid slipper socks- Performed by patient: Don/doff left sock, Don/doff right sock Assist for footwear: Supervision/touching assist Assist for lower body dressing: Supervision or verbal cues Set up : To obtain clothing/put away  Function - Toileting Toileting steps completed by patient: Adjust clothing prior to toileting, Performs perineal hygiene, Adjust clothing after toileting Toileting steps completed by helper: Adjust clothing prior to toileting, Adjust clothing after toileting Toileting Assistive Devices: Grab bar or rail Assist level: Touching or steadying assistance (Pt.75%)  Function - Toilet Transfers Toilet transfer assistive device: Grab bar Assist level to toilet: Supervision or verbal cues Assist level from toilet: Supervision or verbal cues  Function - Chair/bed transfer Chair/bed transfer method:  Ambulatory  Chair/bed transfer assist level: Touching or steadying assistance (Pt > 75%) Chair/bed transfer assistive device: Bedrails, Armrests Chair/bed transfer details: Verbal cues for precautions/safety  Function - Locomotion: Wheelchair Will patient use wheelchair at discharge?: No Function - Locomotion: Ambulation Assistive device: No device Max distance: 150' Assist level: Touching or steadying assistance (Pt > 75%) Assist level: Touching or steadying assistance (Pt > 75%) Assist level: Touching or steadying assistance (Pt > 75%) Assist level: Touching or steadying assistance (Pt > 75%) Assist level: Touching or steadying assistance (Pt > 75%)  Function - Comprehension Comprehension: Auditory Comprehension assist level: Follows basic conversation/direction with no assist  Function - Expression Expression: Verbal Expression assist level: Expresses basic needs/ideas: With no assist  Function - Social Interaction Social Interaction assist level: Interacts appropriately 90% of the time - Needs monitoring or encouragement for participation or interaction.  Function - Problem Solving Problem solving assist level: Solves basic problems with no assist  Function - Memory Memory assist level: Recognizes or recalls 90% of the time/requires cueing < 10% of the time Patient normally able to recall (first 3 days only): That he or she is in a hospital, Location of own room, Current season  Medical Problem List and Plan:  1. 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 2. DVT Prophylaxis/Anticoagulation: Subcutaneous Lovenox. Normal platelet counts and no signs of bleeding  3. Pain Management: Tylenol as needed  4. Mood: Provide emotional support  5. Neuropsych: This patient is capable of making decisions on her own behalf.  6. Skin/Wound Care: Routine skin  checks  7. Fluids/Electrolytes/Nutrition: Routine I&O's , fluid intake limited to 480 mL on 12/22/2017 encourage p.o. fluid intake 8. Hyperlipidemia. Lipitor  9. Tobacco abuse. Counseling  10. History of endometrial cancer status post hysterectomy  11.  Hypokalemia - on KCL 11mq BID, improved as of 12/20/2017   LOS (Days) 7 A FACE TO FACE EVALUATION WAS PERFORMED  ACharlett Blake1/29/2019, 8:59 AM

## 2017-12-24 NOTE — Progress Notes (Signed)
Speech Language Pathology Daily Session Note  Patient Details  Name: [name] MRN: [id number] Date of Birth: Jul 23, 1946  Today's Date: 12/24/2017 SLP Individual Time: 1030-1100 SLP Individual Time Calculation (min): 30 min  Short Term Goals: Week 1: SLP Short Term Goal 1 (Week 1): STGs=LTGs due to short length of stay   Skilled Therapeutic Interventions: Skilled treatment session focused on communication and cognitive goals. Initially, SLP facilitated session by providing Max A verbal and visual cues for recall of new information during a novel card task (Call-It). As session continued, cues faded to Mod A verbal and visual cues. Pt required Min A semantic cues for generative naming. Pt demonstrated selective attention in a moderately distractive environment for ~30 minutes with Supervision verbal cues for redirection. Pt left supine in bed with alarm on and all needs within reach. Continue with current plan of care.   Function:  Cognition Comprehension Comprehension assist level: Follows basic conversation/direction with extra time/assistive device  Expression   Expression assist level: Expresses basic 75 - 89% of the time/requires cueing 10 - 24% of the time. Needs helper to occlude trach/needs to repeat words.  Social Interaction Social Interaction assist level: Interacts appropriately 90% of the time - Needs monitoring or encouragement for participation or interaction.  Problem Solving Problem solving assist level: Solves basic 90% of the time/requires cueing < 10% of the time  Memory Memory assist level: Recognizes or recalls 75 - 89% of the time/requires cueing 10 - 24% of the time    Pain Pain Assessment Pain Assessment: No/denies pain  Therapy/Group: Individual Therapy  [name] 12/24/2017, 12:20 PM

## 2017-12-24 NOTE — Progress Notes (Signed)
Physical Therapy Session Note  Patient Details  Name: [name] MRN: [id number] Date of Birth: 03/02/1946  Today's Date: 12/24/2017 PT Individual Time: 0805-0900 PT Individual Time Calculation (min): 55 min   Short Term Goals: Week 1:  PT Short Term Goal 1 (Week 1): Pt will complete BERG Balance Assessment PT Short Term Goal 2 (Week 1): Pt will recall 2 activities from previous therapy session with min cues PT Short Term Goal 3 (Week 1): Pt will negotiate 12 steps with 1 rail for balance and strengthenign   Skilled Therapeutic Interventions/Progress Updates: Pt presented in bed agreeable to therapy. Pt requesting to use bathroom, performed ambulatory transfer to toilet with supervision (+void). Pt ambulated to rehab gym, instructed and performed floor transfer x 2. Pt neogotiated x 4 steps with L rail only as discussed pt's anxiety about returning home and entering home. Pt performed lateral step overs for stepping over thresholds. Pt continues to demonstrate decreased clearance of RLE. Performed side stepping at rail 46ft x 4 with cues for increased R foot clearance and neutral hips. Pt ambulated to day room and performed NuStep L2 x 6 min for endurance. Pt returned to room at end of session and request to sit at EOB to await next session (in 15 min). Bed alarm set and pt's needs within reach.      Therapy Documentation Precautions:  Precautions Precautions: Fall Restrictions Weight Bearing Restrictions: No General:   Vital Signs: Therapy Vitals Temp: 97.6 F (36.4 C) Temp Source: Oral Pulse Rate: (!) 55 Resp: 18 BP: (!) 147/47 Patient Position (if appropriate): Sitting Oxygen Therapy SpO2: 97 % O2 Device: Not Delivered  See Function Navigator for Current Functional Status.   Therapy/Group: Individual Therapy  Rahiem Schellinger  Krysti Hickling, PTA  12/24/2017, 4:04 PM

## 2017-12-25 ENCOUNTER — Inpatient Hospital Stay (HOSPITAL_COMMUNITY): Payer: Medicare Other

## 2017-12-25 ENCOUNTER — Inpatient Hospital Stay (HOSPITAL_COMMUNITY): Payer: Medicare Other | Admitting: Occupational Therapy

## 2017-12-25 ENCOUNTER — Inpatient Hospital Stay (HOSPITAL_COMMUNITY): Payer: Medicare Other | Admitting: Speech Pathology

## 2017-12-25 ENCOUNTER — Inpatient Hospital Stay (HOSPITAL_COMMUNITY): Payer: Medicare Other | Admitting: Physical Therapy

## 2017-12-25 NOTE — Progress Notes (Signed)
Social Work Patient ID: [name], female   DOB: 30-Mar-1946, 72 y.o.   MRN: [id number]  Met with pt and daughter to discuss team conference progress and discharge tomorrow. She feels ready Caitlin-PT to work on a flight of stairs again with pt, to build her confidence for tomorrow. [facility] to provide her home health follow up and she has all of her equipment. Ready for discharge tomorrow.

## 2017-12-25 NOTE — Progress Notes (Signed)
Physical Therapy Note  Patient Details  Name: Lauran Romanski MRN: [id number] Date of Birth: 1946-04-24 Today's Date: 12/25/2017  1335-1445 70 min individual tx Pain: none per pt  Pt initially declined PT due to abdominal discomfort due to diarrhea x 3 earlier; pt had a laxative today.  She eventually agreed to do bedside tx.  neuromuscular re-education via multimodal cues, demo for:  10 x 2 each  bil lower trunk rotation, modified abdominal crunches, 10 x 1 each R straight leg raises, R isolated long arc quad knee ext with RLE hanging off of EOB.  Sustained stretch R hip flexors and quads with RLE hanging off of EOB, x 3 minutes.  With towel roll between knees, bil bridging x 5; 3 of reps limited by abdominal discomfort.  In L/Rside lying: 15 x 1 each R/L clam shells for hip abduction activation, 10 x 1 each R/L resisted hip flex/ext with flexed knee to isolate glut muscles.  Pt needed multiple supine rests between sets due to abdominal discomfort.   Pt left resting in bed with alarm set and all needs within reach.  Family present.  See function navigator for current status.  Sherra Kimmons 12/25/2017, 12:45 PM

## 2017-12-25 NOTE — Progress Notes (Signed)
Occupational Therapy Discharge Summary  Patient Details  Name: [name] MRN: [id number] Date of Birth: 02/13/1946  Today's Date: 12/25/2017 OT Individual Time: 0900-0957 OT Individual Time Calculation (min): 57 min   Patient has met 11 of 11 long term goals due to improved activity tolerance, improved balance, postural control, ability to compensate for deficits, functional use of  RIGHT upper and RIGHT lower extremity, improved attention, improved awareness and improved coordination.  Patient to discharge at overall Mod I/ Supervision level.  Patient's care partner is independent to provide the necessary cognitive assistance at discharge.    Reasons goals not met: n/a  Recommendation:  Patient will benefit from ongoing skilled OT services in home health setting to continue to advance functional skills in the area of BADL.  Equipment: No equipment provided  Reasons for discharge: treatment goals met and discharge from hospital  Patient/family agrees with progress made and goals achieved: Yes  OT Discharge Precautions/Restrictions  Precautions Precautions: Fall Restrictions Weight Bearing Restrictions: No Pain  none/denies pain ADL ADL Eating: Independent Grooming: Modified independent Upper Body Bathing: Modified independent Lower Body Bathing: Supervision/safety Where Assessed-Lower Body Bathing: Shower Upper Body Dressing: Modified independent (Device) Lower Body Dressing: Modified independent Toileting: Modified independent Toilet Transfer: Distant supervision Toilet Transfer Method: Ambulating Walk-In Shower Transfer: Distant supervision Walk-In Shower Transfer Method: Ambulating ADL Comments: Please see functional navigator Vision Eye Alignment: Within Functional Limits Perception  Perception: Impaired Inattention/Neglect: Does not attend to right side of body(improved since eval) Praxis Praxis: Intact Cognition Orientation Level: Oriented  X4 Sensation Sensation Light Touch: Appears Intact Proprioception: Appears Intact Coordination Fine Motor Movements are Fluid and Coordinated: Yes Coordination and Movement Description: improved coordination but continuies to be slightly slower on R Finger Nose Finger Test: slight dysmetria Mobility  Transfers Sit to Stand: 6: Modified independent (Device/Increase time) Stand to Sit: 6: Modified independent (Device/Increase time)  Trunk/Postural Assessment  Cervical Assessment Cervical Assessment: Within Functional Limits Thoracic Assessment Thoracic Assessment: Within Functional Limits Lumbar Assessment Lumbar Assessment: Within Functional Limits  Balance Balance Balance Assessed: Yes Static Sitting Balance Static Sitting - Balance Support: Feet supported;No upper extremity supported Static Sitting - Level of Assistance: 6: Modified independent (Device/Increase time) Dynamic Sitting Balance Dynamic Sitting - Balance Support: During functional activity Dynamic Sitting - Level of Assistance: 6: Modified independent (Device/Increase time) Static Standing Balance Static Standing - Balance Support: During functional activity Static Standing - Level of Assistance: 6: Modified independent (Device/Increase time) Dynamic Standing Balance Dynamic Standing - Balance Support: During functional activity Dynamic Standing - Level of Assistance: 6: Modified independent (Device/Increase time) Extremity/Trunk Assessment RUE Assessment RUE Assessment: Within Functional Limits RUE Strength RUE Overall Strength: Within Functional Limits for tasks performed RUE Overall Strength Comments: 4+/5 Right Shoulder Flexion: 4+/5 LUE Assessment LUE Assessment: Within Functional Limits   See Function Navigator for Current Functional Status.  Elisabeth S Doe 12/25/2017, 10:00 AM 

## 2017-12-25 NOTE — Progress Notes (Signed)
Speech Language Pathology Daily Session Note  Patient Details  Name: [name] MRN: [id number] Date of Birth: 05-Apr-1946  Today's Date: 12/25/2017 SLP Individual Time: 1130-1200 SLP Individual Time Calculation (min): 30 min  Short Term Goals: Week 1: SLP Short Term Goal 1 (Week 1): STGs=LTGs due to short length of stay   Skilled Therapeutic Interventions: Skilled treatment session focused on completion of patient and family education with the patient's grandson. SLP facilitated session by providing education in regards to the patient's current cognitive-linguistic function and strategies to utilize at home to maximize recall, word-finding and overall safety with functional and familiar tasks. They verbalized understanding of all information and handouts were also given to reinforce information. Continue with current plan of care.   Function:   Cognition Comprehension Comprehension assist level: Understands complex 90% of the time/cues 10% of the time  Expression   Expression assist level: Expresses basic 90% of the time/requires cueing < 10% of the time.  Social Interaction Social Interaction assist level: Interacts appropriately 90% of the time - Needs monitoring or encouragement for participation or interaction.  Problem Solving Problem solving assist level: Solves basic 90% of the time/requires cueing < 10% of the time  Memory Memory assist level: Recognizes or recalls 75 - 89% of the time/requires cueing 10 - 24% of the time    Pain No/Denies Pain   Therapy/Group: Individual Therapy  [name] 12/25/2017, 3:00 PM

## 2017-12-25 NOTE — Progress Notes (Signed)
Subjective/Complaints: Son at bedside, no problems overnight.  Discussed her home layout including 13 steps from main level to upstairs.  She states she has tried only 4 steps here  ROS-  Neg CP, SOB, N/V/D  Objective: Vital Signs: Blood pressure (!) 111/46, pulse 64, temperature 97.9 F (36.6 C), temperature source Oral, resp. rate 16, height 5' 4"  (1.626 m), weight 60.5 kg (133 lb 6.1 oz), SpO2 94 %. No results found. Results for orders placed or performed during the hospital encounter of 12/17/17 (from the past 72 hour(s))  Creatinine, serum     Status: None   Collection Time: 12/24/17  5:42 AM  Result Value Ref Range   Creatinine, Ser 0.60 0.44 - 1.00 mg/dL   GFR calc non Af Amer >60 >60 mL/min   GFR calc Af Amer >60 >60 mL/min    Comment: (NOTE) The eGFR has been calculated using the CKD EPI equation. This calculation has not been validated in all clinical situations. eGFR's persistently <60 mL/min signify possible Chronic Kidney Disease.      HEENT: normal Cardio: RRR and No murmur Resp: CTA B/L and unlabored GI: BS positive and NT, ND Extremity:  No Edema Skin:   Other IV site Left forearm CDI Neuro: Alert/Oriented, Abnormal Sensory difficult to assess secondary toaphasia , Abnormal Motor 4/5 R delt, bi, tri grip, 4/5 R HF, KE,ADF, 5/5 on left side, Abnormal FMC Ataxic/ dec FMC and Aphasic Musc/Skel:  Other no pain with ROM RUE or RLE Gen NAD   Assessment/Plan: 1. Functional deficits secondary to Right hemiparesis, aphasia  which require 3+ hours per day of interdisciplinary therapy in a comprehensive inpatient rehab setting. Physiatrist is providing close team supervision and 24 hour management of active medical problems listed below. Physiatrist and rehab team continue to assess barriers to discharge/monitor patient progress toward functional and medical goals. FIM: Function - Bathing Position: Shower Body parts bathed by patient: Right arm, Left arm, Chest,  Abdomen, Front perineal area, Buttocks, Right upper leg, Left upper leg, Right lower leg, Left lower leg Body parts bathed by helper: Buttocks, Left lower leg, Right lower leg Assist Level: Supervision or verbal cues  Function- Upper Body Dressing/Undressing What is the patient wearing?: Pull over shirt/dress Pull over shirt/dress - Perfomed by patient: Thread/unthread right sleeve, Thread/unthread left sleeve, Put head through opening, Pull shirt over trunk Pull over shirt/dress - Perfomed by helper: Pull shirt over trunk Assist Level: Touching or steadying assistance(Pt > 75%) Function - Lower Body Dressing/Undressing What is the patient wearing?: Pants, Non-skid slipper socks, Underwear Position: Wheelchair/chair at sink Underwear - Performed by patient: Thread/unthread right underwear leg, Thread/unthread left underwear leg, Pull underwear up/down Underwear - Performed by helper: Thread/unthread right underwear leg, Pull underwear up/down Pants- Performed by patient: Thread/unthread left pants leg, Thread/unthread right pants leg, Pull pants up/down Pants- Performed by helper: Pull pants up/down, Thread/unthread right pants leg Non-skid slipper socks- Performed by patient: Don/doff left sock, Don/doff right sock Assist for footwear: Supervision/touching assist Assist for lower body dressing: Supervision or verbal cues Set up : To obtain clothing/put away  Function - Toileting Toileting steps completed by patient: Adjust clothing prior to toileting, Performs perineal hygiene, Adjust clothing after toileting Toileting steps completed by helper: Adjust clothing prior to toileting, Adjust clothing after toileting Toileting Assistive Devices: Grab bar or rail Assist level: Supervision or verbal cues  Function - Toilet Transfers Toilet transfer assistive device: Grab bar Assist level to toilet: Supervision or verbal cues Assist level from toilet:  Supervision or verbal cues  Function -  Chair/bed transfer Chair/bed transfer method: Ambulatory Chair/bed transfer assist level: Supervision or verbal cues Chair/bed transfer assistive device: Bedrails, Armrests Chair/bed transfer details: Verbal cues for precautions/safety  Function - Locomotion: Wheelchair Will patient use wheelchair at discharge?: No Function - Locomotion: Ambulation Assistive device: No device Max distance: 200 Assist level: Supervision or verbal cues Assist level: Supervision or verbal cues Assist level: Supervision or verbal cues Assist level: Supervision or verbal cues Assist level: Supervision or verbal cues  Function - Comprehension Comprehension: Auditory Comprehension assist level: Follows basic conversation/direction with extra time/assistive device  Function - Expression Expression: Verbal Expression assist level: Expresses basic 75 - 89% of the time/requires cueing 10 - 24% of the time. Needs helper to occlude trach/needs to repeat words.  Function - Social Interaction Social Interaction assist level: Interacts appropriately 90% of the time - Needs monitoring or encouragement for participation or interaction.  Function - Problem Solving Problem solving assist level: Solves basic 90% of the time/requires cueing < 10% of the time  Function - Memory Memory assist level: Recognizes or recalls 75 - 89% of the time/requires cueing 10 - 24% of the time Patient normally able to recall (first 3 days only): That he or she is in a hospital, Location of own room, Current season  Medical Problem List and Plan:  1. 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 Formulized individual treatment plan based on medical history, underlying problem and  comorbidities. 2. DVT Prophylaxis/Anticoagulation: Subcutaneous Lovenox. Normal platelet counts and no signs of bleeding  3. Pain Management: Tylenol as needed  4. Mood: Provide emotional support  5. Neuropsych: This patient is capable of making decisions on her own behalf.  6. Skin/Wound Care: Routine skin checks  7. Fluids/Electrolytes/Nutrition: Routine I&O's , fluid intake improved to 840 mL on 12/24/2017 encourage p.o. fluid intake 8. Hyperlipidemia. Lipitor  9. Tobacco abuse. Counseling  10. History of endometrial cancer status post hysterectomy  11.  Hypokalemia - on KCL 59mq BID, improved as of 12/20/2017   LOS (Days) 8 A FACE TO FACE EVALUATION WAS PERFORMED  ACharlett Blake1/30/2019, 9:15 AM

## 2017-12-25 NOTE — Discharge Summary (Signed)
[name], [name]                  ACCOUNT NO.:  [id number]  MEDICAL RECORD NO.:  [id number]  LOCATION:  [id number]                        FACILITY:  [facility]  PHYSICIAN:  Charlett Blake, M.D.DATE OF BIRTH:  Jun 10, 1946  DATE OF ADMISSION:  12/17/2017 DATE OF DISCHARGE:  12/26/2017                              DISCHARGE SUMMARY   DISCHARGE DIAGNOSES: 1. 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 2. Subcutaneous Lovenox for deep vein thrombosis prophylaxis. 3. Pain management. 4. Tobacco abuse. 5. History of endometrial cancer, status post hysterectomy. 6. Hypokalemia, resolved.  This is a 72 year old right-handed female with history of endometrial cancer with hysterectomy as well as tobacco abuse.  She lives with her daughter and grandson, independent prior to admission, working as a Radiation protection practitioner.  Presented on December 15, 2017, with right-sided weakness, facial droop, and aphasia.  Cranial CT scan unremarkable.  She did not receive tPA.  MRI showed acute nonhemorrhagic infarction of left basal ganglia, corpus striatum, without associated mass effect.  MRA with no emergent large vessel occlusion or stenosis.  Echocardiogram with ejection fraction of 44%, grade 1 diastolic dysfunction.  Neurology consulted, placed on aspirin for CVA prophylaxis.  Subcutaneous Lovenox for DVT prophylaxis.  Initially maintained on Rocephin empirically for suspect UTI.  Physical and occupational therapy ongoing.  The patient was admitted for comprehensive rehab program.  PAST MEDICAL HISTORY:  See discharge diagnoses.  SOCIAL HISTORY:  She lives with daughter and grandson, independent prior to admission.  FUNCTIONAL STATUS UPON ADMISSION TO REHAB SERVICES:  Moderate assist 40 feet rolling walker, moderate assist stand pivot transfers, mod to max assist activities of daily living.  PHYSICAL EXAMINATION:  VITAL SIGNS:  Blood pressure 169/57, pulse 55, temperature 98, and  respirations 18. GENERAL:  This was an alert female, in no acute distress. HEENT:  EOMs intact. NECK:  Supple and nontender.  No JVD. CARDIAC:  Rate controlled. ABDOMEN:  Soft, nontender.  Good bowel sounds. LUNGS:  Clear to auscultation without wheeze. NEUROLOGICAL:  She followed simple one-step commands.  Demonstrated some word-finding difficulties.  Left upper extremity 5/5 proximal to distal, right upper extremity 4/5 with fine motor skills mildly decreased, right lower extremity 4-/5 proximal to 5/5 distal.  REHABILITATION HOSPITAL COURSE:  The patient was admitted to [facility] with therapies initiated on a 3-hour daily basis, consisting of physical therapy, occupational therapy, speech therapy, and rehabilitation nursing.  The following issues were addressed during the patient's rehabilitation stay.  Pertaining to [name]' left basal ganglia infarction, remained stable, maintained on aspirin therapy.  She would follow up with Neurology Services.  Subcutaneous Lovenox for DVT prophylaxis, no bleeding episodes.  Blood pressures controlled, on no current antihypertensive medications.  She did continue on Lipitor for hyperlipidemia.  Noted history of tobacco abuse, receiving full counseling in regard to cessation of nicotine products.  Noted history of endometrial cancer, she had undergone hysterectomy in the past. Bouts of hypokalemia, resolved with potassium supplement.  The patient received weekly collaborative interdisciplinary team conferences to discuss estimated length of stay, family teaching, any barriers to her discharge.  She ambulated to the bathroom with supervision, ambulates to the rehab gym.  Performed floor transfers x2, negotiated stairs supervision.  She demonstrated some decreased clearance of right lower extremity, performed sidestepping 15 feet x4.  She can gather her belongings for activities of daily living and homemaking,  dressing, grooming, and hygiene.  She can don her shoes at the edge of bed with setup, ambulate to the day room with minimal cues.  She practiced walking, shower transfers, simulated home environment with supervision. She was fully able to communicate her needs.  Tolerating a regular diet. Overall modified independent with verbal expression.  Full family teaching was completed and plan discharge to home.  DISCHARGE MEDICATIONS: 1. Aspirin 325 mg p.o. daily. 2. Lipitor 80 mg p.o. daily. 3. Azopt ophthalmic solution 1 drop right eye twice daily. 4. Xalatan ophthalmic solution 1 drop right eye at bedtime. 5. Multivitamin daily. 6. Tylenol as needed.  DIET:  Her diet was regular.  FOLLOWUP:  She would follow up with Dr. Alysia Penna at the [facility] as directed; Dr. Antony Contras, Neurology Service, call for appointment; Dr. Rikki Spearing, Medical Management.  SPECIAL INSTRUCTIONS:  No driving.  No smoking.     Lauraine Rinne, P.A.   ______________________________ Charlett Blake, M.D.    DA/MEDQ  D:  12/25/2017  T:  12/25/2017  Job:  [id number]  cc:   Charlett Blake, M.D. Pramod P. Leonie Man, MD Rikki Spearing, MD

## 2017-12-25 NOTE — Progress Notes (Signed)
Speech Language Pathology Discharge Summary  Patient Details  Name: Quanisha Drewry MRN: [id number] Date of Birth: 06/02/1946   Patient has met 5 of 6 long term goals.  Patient to discharge at overall Supervision;Min level.   Reasons goals not met: Patient continues to require Min A verbal cues for recall of new information.    Clinical Impression/Discharge Summary: Patient has made functional gains and has met 5 of 6 LTG's this admission. Currently, patient requires overall supervision-Min A verbal cues for use of word-finding strategies at the sentence and conversation level. Patient also requires overall supervision-Min A verbal cues for problem solving, attention and recall with basic and familiar tasks. Patient and family education is complete and patient will discharge home with 24 hour supervision from family. Patient would benefit from f/u SLP services to maximize her cognitive-linguistic function and overall functional independence.    Care Partner:     Type of Caregiver Assistance: Physical;Cognitive  Recommendation:  24 hour supervision/assistance;Home Health SLP  Rationale for SLP Follow Up: Maximize functional communication;Maximize cognitive function and independence;Reduce caregiver burden   Equipment: N/A   Reasons for discharge: Treatment goals met;Discharged from hospital   Patient/Family Agrees with Progress Made and Goals Achieved: Yes    Index, [place] 12/25/2017, 3:13 PM

## 2017-12-25 NOTE — Progress Notes (Signed)
Physical Therapy Discharge Summary  Patient Details  Name: [name] MRN: [id number] Date of Birth: [birth year]  Today's Date: 12/25/2017 PT Individual Time: 1545-1630(Make up time) PT Individual Time Calculation (min): 45 min    Patient has met 10 of 10 long term goals due to improved activity tolerance, improved balance, improved postural control, increased strength, increased range of motion, ability to compensate for deficits, functional use of  right upper extremity and right lower extremity, improved attention, improved awareness and improved coordination.  Patient to discharge at an ambulatory level Supervision.   Patient's care partner is independent to provide the necessary physical assistance at discharge.  Reasons goals not met: all PT goals met  Recommendation:  Patient will benefit from ongoing skilled PT services in home health setting to continue to advance safe functional mobility, address ongoing impairments in balance safety, endurance, and minimize fall risk.  Equipment: No equipment provided  Reasons for discharge: treatment goals met and discharge from hospital  Patient/family agrees with progress made and goals achieved: Yes   PT Treatment:  PT instructed pt in Grad day assessment to measure progress toward goals. See below for details. Pt returned to room and performed ambulatory transfer to bed with supervision assist. Sit>supine completed without assist, and pt  left supine in bed with call bell in reach and all needs met.     PT Discharge Precautions/Restrictions   Vital Signs Therapy Vitals Temp: 98.3 F (36.8 C) Temp Source: Oral Pulse Rate: 71 Resp: 20 BP: (!) 126/47 Patient Position (if appropriate): Lying Oxygen Therapy SpO2: 94 % O2 Device: Not Delivered Pain Pain Assessment Pain Assessment: 0-10 Pain Score: 0-No pain Vision/Perception  Perception Inattention/Neglect: Does not attend to right side of body(improved since Eval)   Cognition Overall Cognitive Status: Impaired/Different from baseline Arousal/Alertness: Awake/alert Orientation Level: Oriented X4 Attention: Sustained;Selective Sustained Attention: Appears intact Selective Attention: Appears intact Memory: Impaired Memory Impairment: Decreased recall of new information Awareness: Impaired Awareness Impairment: Emergent impairment Problem Solving: Impaired Problem Solving Impairment: Verbal basic;Functional basic Safety/Judgment: Impaired Sensation Sensation Light Touch: Appears Intact Proprioception: Appears Intact Coordination Fine Motor Movements are Fluid and Coordinated: Yes Coordination and Movement Description: improved coordination but continuies to be slightly slower on R Motor  Motor Motor: Abnormal postural alignment and control  Mobility Bed Mobility Bed Mobility: Rolling Right;Rolling Left;Supine to Sit;Sit to Supine Rolling Right: 6: Modified independent (Device/Increase time) Rolling Left: 6: Modified independent (Device/Increase time) Supine to Sit: 6: Modified independent (Device/Increase time) Sit to Supine: 6: Modified independent (Device/Increase time) Transfers Transfers: Yes Sit to Stand: 6: Modified independent (Device/Increase time) Stand to Sit: 6: Modified independent (Device/Increase time) Stand Pivot Transfers: 5: Supervision Locomotion  Ambulation Ambulation: Yes Ambulation/Gait Assistance: 5: Supervision Ambulation Distance (Feet): 200 Feet Assistive device: None Gait Gait: Yes Gait Pattern: Impaired Gait Pattern: Narrow base of support Stairs / Additional Locomotion Stairs: Yes Stairs Assistance: 5: Supervision Stair Management Technique: One rail Left Number of Stairs: 12 Height of Stairs: 6 Wheelchair Mobility Wheelchair Mobility: No  Trunk/Postural Assessment  Cervical Assessment Cervical Assessment: Within Functional Limits Thoracic Assessment Thoracic Assessment: Within Functional  Limits Lumbar Assessment Lumbar Assessment: Within Functional Limits  Balance Balance Balance Assessed: Yes Static Sitting Balance Static Sitting - Level of Assistance: 6: Modified independent (Device/Increase time) Dynamic Sitting Balance Dynamic Sitting - Level of Assistance: 6: Modified independent (Device/Increase time) Static Standing Balance Static Standing - Balance Support: During functional activity;No upper extremity supported Static Standing - Level of Assistance: 6: Modified independent (Device/Increase time)  Dynamic Standing Balance Dynamic Standing - Balance Support: No upper extremity supported;During functional activity Dynamic Standing - Level of Assistance: 5: Stand by assistance Extremity Assessment      RLE AROM (degrees) RLE Overall AROM Comments: WfL assessed in sitting RLE Strength Right Hip Flexion: 4+/5 Right Knee Flexion: 4/5 Right Knee Extension: 5/5 Right Ankle Dorsiflexion: 4+/5 Right Ankle Plantar Flexion: 4+/5 LLE Assessment LLE Assessment: Within Functional Limits LLE AROM (degrees) LLE Overall AROM Comments: WFL assessed in sitting LLE Strength Left Hip Flexion: 4+/5 Left Knee Flexion: 5/5 Left Knee Extension: 5/5 Left Ankle Dorsiflexion: 4+/5 Left Ankle Plantar Flexion: 5/5   See Function Navigator for Current Functional Status.  Lorie Phenix 12/25/2017, 5:41 PM

## 2017-12-25 NOTE — Patient Care Conference (Signed)
Inpatient RehabilitationTeam Conference and Plan of Care Update Date: 12/25/2017   Time: 11:20 AM    Patient Name: [name]      Medical Record Number: [id number]  Date of Birth: Aug 07, 1946 Sex: Female         Room/Bed: 4W24C/4W24C-01 Payor Info: Payor: MEDICARE / Plan: MEDICARE PART A AND B / Product Type: *No Product type* /    Admitting Diagnosis: CVA  Admit Date/Time:  12/17/2017  6:37 PM Admission Comments: No comment available   Primary Diagnosis:  <principal problem not specified> Principal Problem: <principal problem not specified>  Patient Active Problem List   Diagnosis Date Noted  . Left basal ganglia embolic stroke ([address]) 66/44/0347  . History of endometrial cancer   . Chronic diastolic congestive heart failure ([place])   . Hypertension   . Acute blood loss anemia   . Pure hypercholesterolemia   . Acute lower UTI   . Right sided weakness 12/15/2017  . Aphasia 12/15/2017  . Acute CVA (cerebrovascular accident) ([place]) 12/15/2017  . Closed fracture of left hip ([place]) 11/18/2016  . Tobacco abuse   . Chronic cholecystitis s/p lap chole 07/19/2015 07/19/2015    Expected Discharge Date: Expected Discharge Date: 12/26/17  Team Members Present: Physician leading conference: Dr. Alysia Penna Social Worker Present: Ovidio Kin, LCSW Nurse Present: Junius Creamer, RN PT Present: Dwyane Dee, PT OT Present: Cherylynn Ridges, OT SLP Present: Weston Anna, SLP PPS Coordinator present : Daiva Nakayama, RN, CRRN     Current Status/Progress Goal Weekly Team Focus  Medical   poor memory, aphasic  reduce fall risk, improve bowel pattern  D/C planning   Bowel/Bladder   continent of b/b  maintain continence  assist with toileting as needed   Swallow/Nutrition/ Hydration             ADL's   Supervision/Mod I  Supervision/Mod I  R NMR, dc planniing, standing/sitting balance. R attention   Mobility     met supervision-mod/i   supervision-mod/i level     Communication             Safety/Cognition/ Behavioral Observations  Supervision-Min A  Supervision  Family Education    Pain   Denies pain  pain < 2  Assess qshift and prn   Skin   no skin breakdown  prevent skin breakdown and infection  assess qshift and prn      *See Care Plan and progress notes for long and short-term goals.     Barriers to Discharge  Current Status/Progress Possible Resolutions Date Resolved   Physician    Home environment access/layout     progressing quickly  d/c in am      Nursing                  PT                    OT                  SLP                SW                Discharge Planning/Teaching Needs:  Family here to learn pt's care in preparation for discharge tomorrow. Pt is doing well and has made good progress while here      Team Discussion:  Progressing toward her goals of supervision and preparing for discharge tomorrow. Daughter has been here and trained in  her care. Medically stable for discharge. Has done flight of steps according to PT and done well with.  Revisions to Treatment Plan:  DC 1/31    Continued Need for Acute Rehabilitation Level of Care: The patient requires daily medical management by a physician with specialized training in physical medicine and rehabilitation for the following conditions: Daily direction of a multidisciplinary physical rehabilitation program to ensure safe treatment while eliciting the highest outcome that is of practical value to the patient.: Yes Daily medical management of patient stability for increased activity during participation in an intensive rehabilitation regime.: Yes Daily analysis of laboratory values and/or radiology reports with any subsequent need for medication adjustment of medical intervention for : Neurological problems  Merle Whitehorn, Gardiner Rhyme 12/26/2017, 8:28 AM

## 2017-12-25 NOTE — Progress Notes (Signed)
Speech Language Pathology Daily Session Note  Patient Details  Name: [name] MRN: [id number] Date of Birth: 03-Jun-1946  Today's Date: 12/25/2017 SLP Individual Time: 0830-0900 SLP Individual Time Calculation (min): 30 min  Short Term Goals: Week 1: SLP Short Term Goal 1 (Week 1): STGs=LTGs due to short length of stay   Skilled Therapeutic Interventions: Skilled treatment session focused on communication goals. Upon arrival, patient was supine in bed. Pt requested to use the bathroom, and required supervision verbal cues for safety due to impulsivity. SLP administered Western Aphasia Battery - Bedside (WAB) and pt produced an aphasia score of 87.5 indicating a mild aphasia and a language score of 85.6 indicating mild language deficits. Pt demonstrated difficulties in verbal description, sequential commands, and auditory verbal comprehension. Pt left sitting EOB with all needs within reach and grandson present. Continue with current plan of care.    Function:  Cognition Comprehension Comprehension assist level: Understands complex 90% of the time/cues 10% of the time  Expression   Expression assist level: Expresses basic 90% of the time/requires cueing < 10% of the time.  Social Interaction Social Interaction assist level: Interacts appropriately 90% of the time - Needs monitoring or encouragement for participation or interaction.  Problem Solving Problem solving assist level: Solves basic 90% of the time/requires cueing < 10% of the time  Memory Memory assist level: Recognizes or recalls 75 - 89% of the time/requires cueing 10 - 24% of the time    Pain No/Denies pain.  Therapy/Group: Individual Therapy  [name]  SLP - Student 12/25/2017, 1:23 PM

## 2017-12-25 NOTE — Discharge Summary (Signed)
Discharge summary job [id number]

## 2017-12-25 NOTE — Discharge Instructions (Signed)
Inpatient Rehab Discharge Instructions  [name] Discharge date and time: No discharge date for patient encounter.   Activities/Precautions/ Functional Status: Activity: activity as tolerated Diet: regular diet Wound Care: none needed Functional status:  ___ No restrictions     ___ Walk up steps independently ___ 24/7 supervision/assistance   ___ Walk up steps with assistance ___ Intermittent supervision/assistance  ___ Bathe/dress independently ___ Walk with walker     _x__ Bathe/dress with assistance ___ Walk Independently    ___ Shower independently ___ Walk with assistance    ___ Shower with assistance ___ No alcohol     ___ Return to work/school ________  Special Instructions: No driving or smoking   COMMUNITY REFERRALS UPON DISCHARGE:    Home Health:   PT, OT, SP    Agency:[facility]   Phone:[phone]   Date of last service:12/26/2017  Medical Equipment/Items Ordered:NO NEEDS; HAS FROM HIP SURGERY    GENERAL COMMUNITY RESOURCES FOR PATIENT/FAMILY: Support Groups:[facility] SUPPORT GROUP EVERY SECOND Thursday @ 3:00-4:00 PM ON THE REHAB UNIT QUESTIONS CONTACT [place] [phone]   STROKE/TIA DISCHARGE INSTRUCTIONS SMOKING Cigarette smoking nearly doubles your risk of having a stroke & is the single most alterable risk factor  If you smoke or have smoked in the last 12 months, you are advised to quit smoking for your health.  Most of the excess cardiovascular risk related to smoking disappears within a year of stopping.  Ask you doctor about anti-smoking medications  [place] Quit Line: 1-800-QUIT NOW  Free Smoking Cessation Classes [phone]  CHOLESTEROL Know your levels; limit fat & cholesterol in your diet  Lipid Panel     Component Value Date/Time   CHOL 176 12/16/2017 0446   TRIG 57 12/16/2017 0446   HDL 41 12/16/2017 0446   CHOLHDL 4.3 12/16/2017 0446   VLDL 11 12/16/2017 0446   LDLCALC 124 (H) 12/16/2017 0446      Many patients benefit from  treatment even if their cholesterol is at goal.  Goal: Total Cholesterol (CHOL) less than 160  Goal:  Triglycerides (TRIG) less than 150  Goal:  HDL greater than 40  Goal:  LDL (LDLCALC) less than 100   BLOOD PRESSURE American Stroke Association blood pressure target is less that 120/80 mm/Hg  Your discharge blood pressure is:  BP: (!) 117/46  Monitor your blood pressure  Limit your salt and alcohol intake  Many individuals will require more than one medication for high blood pressure  DIABETES (A1c is a blood sugar average for last 3 months) Goal HGBA1c is under 7% (HBGA1c is blood sugar average for last 3 months)  Diabetes: No known diagnosis of diabetes    Lab Results  Component Value Date   HGBA1C 5.1 12/16/2017     Your HGBA1c can be lowered with medications, healthy diet, and exercise.  Check your blood sugar as directed by your physician  Call your physician if you experience unexplained or low blood sugars.  PHYSICAL ACTIVITY/REHABILITATION Goal is 30 minutes at least 4 days per week  Activity: Increase activity slowly, Therapies: Physical Therapy: Home Health Return to work:   Activity decreases your risk of heart attack and stroke and makes your heart stronger.  It helps control your weight and blood pressure; helps you relax and can improve your mood.  Participate in a regular exercise program.  Talk with your doctor about the best form of exercise for you (dancing, walking, swimming, cycling).  DIET/WEIGHT Goal is to maintain a healthy weight  Your  discharge diet is: Fall precautions Diet Heart Room service appropriate? Yes; Fluid consistency: Thin  liquids Your height is:  Height: 5\' 4"  (162.6 cm) Your current weight is: Weight: 60.8 kg (134 lb 0.6 oz) Your Body Mass Index (BMI) is:  BMI (Calculated): 23  Following the type of diet specifically designed for you will help prevent another stroke.  Your goal weight range is:    Your goal Body Mass Index  (BMI) is 19-24.  Healthy food habits can help reduce 3 risk factors for stroke:  High cholesterol, hypertension, and excess weight.  RESOURCES Stroke/Support Group:  Call [phone]   STROKE EDUCATION PROVIDED/REVIEWED AND GIVEN TO PATIENT Stroke warning signs and symptoms How to activate emergency medical system (call 911). Medications prescribed at discharge. Need for follow-up after discharge. Personal risk factors for stroke. Pneumonia vaccine given:  Flu vaccine given:  My questions have been answered, the writing is legible, and I understand these instructions.  I will adhere to these goals & educational materials that have been provided to me after my discharge from the hospital.      My questions have been answered and I understand these instructions. I will adhere to these goals and the provided educational materials after my discharge from the hospital.  Patient/Caregiver Signature _______________________________ Date __________  Clinician Signature _______________________________________ Date __________  Please bring this form and your medication list with you to all your follow-up doctor's appointments.

## 2017-12-25 NOTE — Progress Notes (Signed)
Nutrition Brief Note  Patient identified on the Malnutrition Screening Tool (MST) Report.   Nutrition focused physical exam completed. No muscle or subcutaneous fat depletion noted.  Interviewed pt with daughter and grandson present.  Good appetite PTA. Pt reported UBW 143lb, but could not remember last time she weighed herself. Confirmed with pt and daughter that weight loss was not recent.  Wt Readings from Last 15 Encounters:  12/25/17 133 lb 6.1 oz (60.5 kg)  12/16/17 135 lb 5.8 oz (61.4 kg)  12/16/17 135 lb (61.2 kg)  11/18/16 144 lb (65.3 kg)  07/19/15 148 lb (67.1 kg)  07/14/15 148 lb (67.1 kg)   Body mass index is Body mass index is 22.89 kg/m. Patient meets criteria for normal weight based on current BMI.   Current diet order is Diet Heart, patient is consuming approximately 75% of meals at this time. Pt reports good appetite, but does not care for the low-sodium Heart Diet hospital food. Daughter has been bringing in food that pt has enjoyed.   Labs and medications reviewed.  Pt and family inquired about low-sodium diet post discharge. Provided brief education on ways to reduce sodium (e.g. Preparing foods at home without salt; eating more fresh foods; reading labels/nutrition facts at store and restaurants, purchasing salt-free seasonings).  No nutrition interventions warranted at this time. If nutrition issues arise, please consult RD  Edmonia Lynch, MS Dietetic Intern Pager: [phone]

## 2017-12-26 MED ORDER — BRINZOLAMIDE 1 % OP SUSP: 1.0000 [drp] | Freq: Two times a day (BID) | OPHTHALMIC | 12 refills | Status: AC

## 2017-12-26 MED ORDER — ATORVASTATIN CALCIUM 80 MG PO TABS: 80.0000 mg | ORAL_TABLET | Freq: Every day | ORAL | 0 refills | Status: AC

## 2017-12-26 NOTE — Progress Notes (Signed)
Subjective/Complaints:  No issues overnite, feels ready to go home  ROS-  Neg CP, SOB, N/V/D  Objective: Vital Signs: Blood pressure (!) 117/45, pulse 60, temperature 97.9 F (36.6 C), temperature source Oral, resp. rate 18, height 5' 4"  (1.626 m), weight 60.5 kg (133 lb 6.1 oz), SpO2 96 %. No results found. Results for orders placed or performed during the hospital encounter of 12/17/17 (from the past 72 hour(s))  Creatinine, serum     Status: None   Collection Time: 12/24/17  5:42 AM  Result Value Ref Range   Creatinine, Ser 0.60 0.44 - 1.00 mg/dL   GFR calc non Af Amer >60 >60 mL/min   GFR calc Af Amer >60 >60 mL/min    Comment: (NOTE) The eGFR has been calculated using the CKD EPI equation. This calculation has not been validated in all clinical situations. eGFR's persistently <60 mL/min signify possible Chronic Kidney Disease.      HEENT: normal Cardio: RRR and No murmur Resp: CTA B/L and unlabored GI: BS positive and NT, ND Extremity:  No Edema Skin:   Other IV site Left forearm CDI Neuro: Alert/Oriented, Abnormal Sensory difficult to assess secondary toaphasia , Abnormal Motor 4/5 R delt, bi, tri grip, 4/5 R HF, KE,ADF, 5/5 on left side, Abnormal FMC Ataxic/ dec FMC and Aphasic Musc/Skel:  Other no pain with ROM RUE or RLE Gen NAD   Assessment/Plan: 1. Functional deficits secondary to Right hemiparesis, aphasia  Stable for D/C today F/u PCP in 3-4 weeks F/u PM&R 2 weeks See D/C summary See D/C instructions No driving FIM: Function - Bathing Position: Shower Body parts bathed by patient: Right arm, Left arm, Chest, Abdomen, Front perineal area, Buttocks, Right upper leg, Left upper leg, Right lower leg, Left lower leg Body parts bathed by helper: Buttocks, Left lower leg, Right lower leg Assist Level: Supervision or verbal cues  Function- Upper Body Dressing/Undressing What is the patient wearing?: Pull over shirt/dress Pull over shirt/dress - Perfomed by  patient: Thread/unthread left sleeve, Thread/unthread right sleeve, Pull shirt over trunk, Put head through opening Pull over shirt/dress - Perfomed by helper: Pull shirt over trunk Assist Level: No help, No cues Function - Lower Body Dressing/Undressing What is the patient wearing?: Pants, Non-skid slipper socks, Underwear Position: Wheelchair/chair at sink Underwear - Performed by patient: Thread/unthread right underwear leg, Pull underwear up/down, Thread/unthread left underwear leg Underwear - Performed by helper: Thread/unthread right underwear leg, Pull underwear up/down Pants- Performed by patient: Thread/unthread right pants leg, Thread/unthread left pants leg, Pull pants up/down Pants- Performed by helper: Pull pants up/down, Thread/unthread right pants leg Non-skid slipper socks- Performed by patient: Don/doff right sock, Don/doff left sock Assist for footwear: Independent Assist for lower body dressing: No Help, No cues Set up : To obtain clothing/put away  Function - Toileting Toileting steps completed by patient: Adjust clothing prior to toileting, Performs perineal hygiene, Adjust clothing after toileting Toileting steps completed by helper: Adjust clothing prior to toileting, Adjust clothing after toileting [place]: Grab bar or rail Assist level: Supervision or verbal cues  Function - Toilet Transfers Toilet transfer assistive device: Grab bar Assist level to toilet: Supervision or verbal cues Assist level from toilet: Supervision or verbal cues  Function - Chair/bed transfer Chair/bed transfer method: Stand pivot Chair/bed transfer assist level: Supervision or verbal cues Chair/bed transfer assistive device: Bedrails, Armrests Chair/bed transfer details: Verbal cues for precautions/safety  Function - Locomotion: Wheelchair Will patient use wheelchair at discharge?: No Wheelchair activity did  not occur: N/A Wheel 50 feet with 2 turns activity did  not occur: N/A Wheel 150 feet activity did not occur: N/A Function - Locomotion: Ambulation Assistive device: No device Max distance: 261f  Assist level: Supervision or verbal cues Assist level: Supervision or verbal cues Assist level: Supervision or verbal cues Assist level: Supervision or verbal cues Assist level: Supervision or verbal cues  Function - Comprehension Comprehension: Auditory Comprehension assist level: Understands complex 90% of the time/cues 10% of the time  Function - Expression Expression: Verbal Expression assist level: Expresses basic 90% of the time/requires cueing < 10% of the time.  Function - Social Interaction Social Interaction assist level: Interacts appropriately 90% of the time - Needs monitoring or encouragement for participation or interaction.  Function - Problem Solving Problem solving assist level: Solves basic 90% of the time/requires cueing < 10% of the time  Function - Memory Memory assist level: Recognizes or recalls 75 - 89% of the time/requires cueing 10 - 24% of the time Patient normally able to recall (first 3 days only): Current season, Staff names and faces, That he or she is in a hospital, Location of own room  Medical Problem List and Plan:  1. 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 DVT Prophylaxis/Anticoagulation: Subcutaneous Lovenox. Normal platelet counts and no signs of bleeding  3. Pain Management: Tylenol as needed  4. Mood: Provide emotional support  5. Neuropsych: This patient is capable of making decisions on her own behalf.  6. Skin/Wound Care: Routine skin checks  7. Fluids/Electrolytes/Nutrition: Routine I&O's , fluid intake  480 mL on 12/25/2017 encourage p.o. fluid intake 8. Hyperlipidemia. Lipitor  9. Tobacco abuse. Counseling  10. History of endometrial cancer status post hysterectomy  11.   Hypokalemia - on KCL 29m BID, improved as of 12/20/2017   LOS (Days) 9 A FACE TO FACE EVALUATION WAS PERFORMED  AnCharlett Blake/31/2019, 8:34 AM

## 2017-12-26 NOTE — Progress Notes (Signed)
Social Work   Jonai Weyland, Eliezer Champagne  Social Worker  Physical Medicine and Rehabilitation  Patient Care Conference  Signed  Date of Service:  12/25/2017 10:10 AM          Signed          [] Hide copied text  [] Hover for details   Inpatient RehabilitationTeam Conference and Plan of Care Update Date: 12/25/2017   Time: 11:20 AM      Patient Name: [name]      Medical Record Number: [id number]  Date of Birth: [birth year] Sex: Female         Room/Bed: 4W24C/4W24C-01 Payor Info: Payor: MEDICARE / Plan: MEDICARE PART A AND B / Product Type: *No Product type* /     Admitting Diagnosis: CVA  Admit Date/Time:  12/17/2017  6:37 PM Admission Comments: No comment available    Primary Diagnosis:  <principal problem not specified> Principal Problem: <principal problem not specified>       Patient Active Problem List    Diagnosis Date Noted  . Left basal ganglia embolic stroke ([place]) 81/77/1165  . History of endometrial cancer    . Chronic diastolic congestive heart failure ([place])    . Hypertension    . Acute blood loss anemia    . Pure hypercholesterolemia    . Acute lower UTI    . Right sided weakness 12/15/2017  . Aphasia 12/15/2017  . Acute CVA (cerebrovascular accident) ([place]) 12/15/2017  . Closed fracture of left hip ([place]) 11/18/2016  . Tobacco abuse    . Chronic cholecystitis s/p lap chole 07/19/2015 07/19/2015      Expected Discharge Date: Expected Discharge Date: 12/26/17   Team Members Present: Physician leading conference: Dr. Alysia Penna Social Worker Present: Ovidio Kin, LCSW Nurse Present: Junius Creamer, RN PT Present: Dwyane Dee, PT OT Present: Cherylynn Ridges, OT SLP Present: Weston Anna, SLP PPS Coordinator present : Daiva Nakayama, RN, CRRN       Current Status/Progress Goal Weekly Team Focus  Medical     poor memory, aphasic  reduce fall risk, improve bowel pattern  D/C planning   Bowel/Bladder     continent of b/b  maintain  continence  assist with toileting as needed   Swallow/Nutrition/ Hydration               ADL's     Supervision/Mod I  Supervision/Mod I  R NMR, dc planniing, standing/sitting balance. R attention   Mobility       met supervision-mod/i   supervision-mod/i level     Communication               Safety/Cognition/ Behavioral Observations   Supervision-Min A  Supervision  Family Education    Pain     Denies pain  pain < 2  Assess qshift and prn   Skin     no skin breakdown  prevent skin breakdown and infection  assess qshift and prn     *See Care Plan and progress notes for long and short-term goals.      Barriers to Discharge   Current Status/Progress Possible Resolutions Date Resolved   Physician     Home environment access/layout     progressing quickly  d/c in am      Nursing                 PT                    OT  SLP            SW              Discharge Planning/Teaching Needs:  Family here to learn pt's care in preparation for discharge tomorrow. Pt is doing well and has made good progress while here      Team Discussion:  Progressing toward her goals of supervision and preparing for discharge tomorrow. Daughter has been here and trained in her care. Medically stable for discharge. Has done flight of steps according to PT and done well with.  Revisions to Treatment Plan:  DC 1/31    Continued Need for Acute Rehabilitation Level of Care: The patient requires daily medical management by a physician with specialized training in physical medicine and rehabilitation for the following conditions: Daily direction of a multidisciplinary physical rehabilitation program to ensure safe treatment while eliciting the highest outcome that is of practical value to the patient.: Yes Daily medical management of patient stability for increased activity during participation in an intensive rehabilitation regime.: Yes Daily analysis of laboratory values and/or  radiology reports with any subsequent need for medication adjustment of medical intervention for : Neurological problems   Nadyne Gariepy, Gardiner Rhyme 12/26/2017, 8:28 AM                  Rhyse Loux, Gardiner Rhyme, LCSW  Social Worker  Physical Medicine and Rehabilitation  Patient Care Conference  Signed  Date of Service:  12/18/2017  3:31 PM          Signed          [] Hide copied text  [] Hover for details   Inpatient RehabilitationTeam Conference and Plan of Care Update Date: 12/18/2017   Time: 11:30 AM      Patient Name: [name]      Medical Record Number: [id number]  Date of Birth: 1946/06/13 Sex: Female         Room/Bed: 4W24C/4W24C-01 Payor Info: Payor: MEDICARE / Plan: MEDICARE PART A AND B / Product Type: *No Product type* /     Admitting Diagnosis: CVA  Admit Date/Time:  12/17/2017  6:37 PM Admission Comments: No comment available    Primary Diagnosis:  <principal problem not specified> Principal Problem: <principal problem not specified>       Patient Active Problem List    Diagnosis Date Noted  . Left basal ganglia embolic stroke ([place]) 39/76/7341  . History of endometrial cancer    . Chronic diastolic congestive heart failure ([employer])    . Hypertension    . Acute blood loss anemia    . Pure hypercholesterolemia    . Acute lower UTI    . Right sided weakness 12/15/2017  . Aphasia 12/15/2017  . Acute CVA (cerebrovascular accident) ([place]) 12/15/2017  . Closed fracture of left hip ([place]) 11/18/2016  . Tobacco abuse    . Chronic cholecystitis s/p lap chole 07/19/2015 07/19/2015      Expected Discharge Date: Expected Discharge Date: 12/26/17   Team Members Present: Physician leading conference: Dr. Alysia Penna Social Worker Present: Ovidio Kin, LCSW Nurse Present: Junius Creamer, RN PT Present: Dwyane Dee, PT OT Present: Cherylynn Ridges, OT SLP Present: Weston Anna, SLP PPS Coordinator present : Daiva Nakayama, RN, CRRN       Current  Status/Progress Goal Weekly Team Focus  Medical       monitoring potassium level   adjusting medications     Bowel/Bladder     continent  remain continent  continue to monitor &  assist as needed   Swallow/Nutrition/ Hydration               ADL's     Min A overall  Supervision/mod I  R NMR, functional ambulation, standing/sitting balance, R lean, R attention,   Mobility     min assist overall  supervision  balance, attention, cognitive remediation, funcitonal mobility progression    Communication     Eval Pending          Safety/Cognition/ Behavioral Observations   Eval Pending          Pain     no c/o pain, has tylenol prn  pain scale <4  assess & treat as needed   Skin     no skin break down  no new skin break down  assess q shift     *See Care Plan and progress notes for long and short-term goals.      Barriers to Discharge   Current Status/Progress Possible Resolutions Date Resolved   Physician                    Nursing                 PT  Decreased caregiver support                 OT                 SLP            SW              Discharge Planning/Teaching Needs:    Home with daughter-Tammy and grandson assisting with her care.     Team Discussion:  Goals being set for supervision level. Being evaluated today. Poor po intake working on this. Leans to the right. MD working on low potassium level-monitoring. Tends to perserevates at times.  Revisions to Treatment Plan:  New eval target discharge date 1/31      [name] 12/19/2017, 8:31 AM                 Patient ID: [name], female   DOB: [birth year], 72 y.o.   MRN: [id number]

## 2017-12-26 NOTE — Progress Notes (Signed)
Pt discharged to home with daughter. Pt has all instructions and all questions were answered

## 2017-12-26 NOTE — Progress Notes (Signed)
Social Work  Discharge Note  The overall goal for the admission was met for:   Discharge location: [facility] SUPERVISION  Length of Stay: Yes-9 DAYS  Discharge activity level: Yes-SUPERVISION LEVEL  Home/community participation: Yes  Services provided included: MD, RD, PT, OT, SLP, RN, CM, TR, Pharmacy and SW  Financial Services: Medicare and Private Insurance: [place]  Follow-up services arranged: Home Health: [facility]-PT,OT,SP and Patient/Family request agency HH: HAD BEFORE AFTER THR, DME: NO NEEDS  Comments (or additional information):PT PROGRESSED WELL AND FAMILY HERE DAILY AND COMFORTABLE WITH HER CARE. HAS ALL EQUIPMENT FROM HIP SURGERY  Patient/Family verbalized understanding of follow-up arrangements: Yes  Individual responsible for coordination of the follow-up plan: SELF & TAMMY-DAUGHTER Confirmed correct DME delivered: [name] 12/26/2017    [name]

## 2017-12-27 DIAGNOSIS — E7800 Pure hypercholesterolemia, unspecified: Secondary | ICD-10-CM | POA: Diagnosis not present

## 2017-12-27 DIAGNOSIS — I69320 Aphasia following cerebral infarction: Secondary | ICD-10-CM | POA: Diagnosis not present

## 2017-12-27 DIAGNOSIS — Z7982 Long term (current) use of aspirin: Secondary | ICD-10-CM | POA: Diagnosis not present

## 2017-12-27 DIAGNOSIS — I5032 Chronic diastolic (congestive) heart failure: Secondary | ICD-10-CM | POA: Diagnosis not present

## 2017-12-27 DIAGNOSIS — I69351 Hemiplegia and hemiparesis following cerebral infarction affecting right dominant side: Secondary | ICD-10-CM | POA: Diagnosis not present

## 2017-12-27 DIAGNOSIS — I110 Hypertensive heart disease with heart failure: Secondary | ICD-10-CM | POA: Diagnosis not present

## 2017-12-30 DIAGNOSIS — I5032 Chronic diastolic (congestive) heart failure: Secondary | ICD-10-CM | POA: Diagnosis not present

## 2017-12-30 DIAGNOSIS — Z7982 Long term (current) use of aspirin: Secondary | ICD-10-CM | POA: Diagnosis not present

## 2017-12-30 DIAGNOSIS — I110 Hypertensive heart disease with heart failure: Secondary | ICD-10-CM | POA: Diagnosis not present

## 2017-12-30 DIAGNOSIS — E7800 Pure hypercholesterolemia, unspecified: Secondary | ICD-10-CM | POA: Diagnosis not present

## 2017-12-30 DIAGNOSIS — I69320 Aphasia following cerebral infarction: Secondary | ICD-10-CM | POA: Diagnosis not present

## 2017-12-30 DIAGNOSIS — I69351 Hemiplegia and hemiparesis following cerebral infarction affecting right dominant side: Secondary | ICD-10-CM | POA: Diagnosis not present

## 2017-12-31 DIAGNOSIS — E7800 Pure hypercholesterolemia, unspecified: Secondary | ICD-10-CM | POA: Diagnosis not present

## 2017-12-31 DIAGNOSIS — I69351 Hemiplegia and hemiparesis following cerebral infarction affecting right dominant side: Secondary | ICD-10-CM | POA: Diagnosis not present

## 2017-12-31 DIAGNOSIS — I110 Hypertensive heart disease with heart failure: Secondary | ICD-10-CM | POA: Diagnosis not present

## 2017-12-31 DIAGNOSIS — Z7982 Long term (current) use of aspirin: Secondary | ICD-10-CM | POA: Diagnosis not present

## 2017-12-31 DIAGNOSIS — I5032 Chronic diastolic (congestive) heart failure: Secondary | ICD-10-CM | POA: Diagnosis not present

## 2017-12-31 DIAGNOSIS — I69320 Aphasia following cerebral infarction: Secondary | ICD-10-CM | POA: Diagnosis not present

## 2018-01-01 DIAGNOSIS — I110 Hypertensive heart disease with heart failure: Secondary | ICD-10-CM | POA: Diagnosis not present

## 2018-01-01 DIAGNOSIS — I5032 Chronic diastolic (congestive) heart failure: Secondary | ICD-10-CM | POA: Diagnosis not present

## 2018-01-01 DIAGNOSIS — E7800 Pure hypercholesterolemia, unspecified: Secondary | ICD-10-CM | POA: Diagnosis not present

## 2018-01-01 DIAGNOSIS — Z7982 Long term (current) use of aspirin: Secondary | ICD-10-CM | POA: Diagnosis not present

## 2018-01-01 DIAGNOSIS — I69351 Hemiplegia and hemiparesis following cerebral infarction affecting right dominant side: Secondary | ICD-10-CM | POA: Diagnosis not present

## 2018-01-01 DIAGNOSIS — I69320 Aphasia following cerebral infarction: Secondary | ICD-10-CM | POA: Diagnosis not present

## 2018-01-02 DIAGNOSIS — I69320 Aphasia following cerebral infarction: Secondary | ICD-10-CM | POA: Diagnosis not present

## 2018-01-02 DIAGNOSIS — Z7982 Long term (current) use of aspirin: Secondary | ICD-10-CM | POA: Diagnosis not present

## 2018-01-02 DIAGNOSIS — E7800 Pure hypercholesterolemia, unspecified: Secondary | ICD-10-CM | POA: Diagnosis not present

## 2018-01-02 DIAGNOSIS — I5032 Chronic diastolic (congestive) heart failure: Secondary | ICD-10-CM | POA: Diagnosis not present

## 2018-01-02 DIAGNOSIS — I110 Hypertensive heart disease with heart failure: Secondary | ICD-10-CM | POA: Diagnosis not present

## 2018-01-02 DIAGNOSIS — I69351 Hemiplegia and hemiparesis following cerebral infarction affecting right dominant side: Secondary | ICD-10-CM | POA: Diagnosis not present

## 2018-01-06 DIAGNOSIS — I69320 Aphasia following cerebral infarction: Secondary | ICD-10-CM | POA: Diagnosis not present

## 2018-01-06 DIAGNOSIS — I69351 Hemiplegia and hemiparesis following cerebral infarction affecting right dominant side: Secondary | ICD-10-CM | POA: Diagnosis not present

## 2018-01-06 DIAGNOSIS — I110 Hypertensive heart disease with heart failure: Secondary | ICD-10-CM | POA: Diagnosis not present

## 2018-01-06 DIAGNOSIS — E7800 Pure hypercholesterolemia, unspecified: Secondary | ICD-10-CM | POA: Diagnosis not present

## 2018-01-06 DIAGNOSIS — Z7982 Long term (current) use of aspirin: Secondary | ICD-10-CM | POA: Diagnosis not present

## 2018-01-06 DIAGNOSIS — I5032 Chronic diastolic (congestive) heart failure: Secondary | ICD-10-CM | POA: Diagnosis not present

## 2018-01-07 ENCOUNTER — Encounter: Payer: Self-pay | Admitting: Physical Medicine & Rehabilitation

## 2018-01-07 ENCOUNTER — Ambulatory Visit (HOSPITAL_BASED_OUTPATIENT_CLINIC_OR_DEPARTMENT_OTHER): Payer: Medicare Other | Admitting: Physical Medicine & Rehabilitation

## 2018-01-07 ENCOUNTER — Encounter: Payer: Medicare Other | Attending: Physical Medicine & Rehabilitation

## 2018-01-07 VITALS — BP 127/71 | HR 54

## 2018-01-07 DIAGNOSIS — Z87891 Personal history of nicotine dependence: Secondary | ICD-10-CM | POA: Insufficient documentation

## 2018-01-07 DIAGNOSIS — R4701 Aphasia: Secondary | ICD-10-CM

## 2018-01-07 DIAGNOSIS — E785 Hyperlipidemia, unspecified: Secondary | ICD-10-CM | POA: Insufficient documentation

## 2018-01-07 DIAGNOSIS — I639 Cerebral infarction, unspecified: Secondary | ICD-10-CM

## 2018-01-07 DIAGNOSIS — Z9049 Acquired absence of other specified parts of digestive tract: Secondary | ICD-10-CM | POA: Insufficient documentation

## 2018-01-07 DIAGNOSIS — I69951 Hemiplegia and hemiparesis following unspecified cerebrovascular disease affecting right dominant side: Secondary | ICD-10-CM | POA: Diagnosis not present

## 2018-01-07 DIAGNOSIS — Z8542 Personal history of malignant neoplasm of other parts of uterus: Secondary | ICD-10-CM | POA: Diagnosis not present

## 2018-01-07 DIAGNOSIS — Z7982 Long term (current) use of aspirin: Secondary | ICD-10-CM | POA: Diagnosis not present

## 2018-01-07 DIAGNOSIS — I69320 Aphasia following cerebral infarction: Secondary | ICD-10-CM | POA: Diagnosis not present

## 2018-01-07 DIAGNOSIS — Z90710 Acquired absence of both cervix and uterus: Secondary | ICD-10-CM | POA: Diagnosis not present

## 2018-01-07 DIAGNOSIS — I69351 Hemiplegia and hemiparesis following cerebral infarction affecting right dominant side: Secondary | ICD-10-CM | POA: Insufficient documentation

## 2018-01-07 DIAGNOSIS — I110 Hypertensive heart disease with heart failure: Secondary | ICD-10-CM | POA: Diagnosis not present

## 2018-01-07 DIAGNOSIS — E7800 Pure hypercholesterolemia, unspecified: Secondary | ICD-10-CM | POA: Diagnosis not present

## 2018-01-07 DIAGNOSIS — Z96642 Presence of left artificial hip joint: Secondary | ICD-10-CM | POA: Diagnosis not present

## 2018-01-07 DIAGNOSIS — I5032 Chronic diastolic (congestive) heart failure: Secondary | ICD-10-CM | POA: Diagnosis not present

## 2018-01-07 NOTE — Patient Instructions (Signed)
No driving for now reasses in 6 wks

## 2018-01-07 NOTE — Progress Notes (Signed)
Subjective:    Patient ID: [name], female    DOB: 07-27-1946, 72 y.o.   MRN: [id number] 72 year old right-handed female with history of endometrial cancer with hysterectomy as well as tobacco abuse.  She lives with her daughter and grandson, independent prior to admission, working as a Radiation protection practitioner.  Presented on December 15, 2017, with right-sided weakness, facial droop, and aphasia.  Cranial CT scan unremarkable.  She did not receive tPA.  MRI showed acute nonhemorrhagic infarction of left basal ganglia, corpus striatum, without associated mass effect.  MRA with no emergent large vessel occlusion or stenosis.  Echocardiogram with ejection fraction of 24%, grade 1 diastolic dysfunction.  Neurology consulted, placed on aspirin for CVA prophylaxis.  Subcutaneous Lovenox for DVT prophylaxis.  Initially maintained on Rocephin empirically for suspect UTI  DATE OF ADMISSION:  12/17/2017 DATE OF DISCHARGE:  12/26/2017   HPI   HHOT stopped HHPT, and SLP are continuing Daughter still needs to assist with med management Slow processing Problem with word finding Some dizziness when turning to the right Doing about 1-1.5 hours of book keeping per day, daughter is assisting  Pain Inventory Average Pain 0 Pain Right Now 0 My pain is n/a  In the last 24 hours, has pain interfered with the following? General activity 0 Relation with others 0 Enjoyment of life 0 What TIME of day is your pain at its worst? n/a Sleep (in general) Fair  Pain is worse with: n/a Pain improves with: n/a Relief from Meds: 0  Mobility do you drive?  yes  Function employed # of hrs/week 40 hrs  Neuro/Psych bladder control problems loss of taste or smell  Prior Studies Any changes since last visit?  no CT/MRI  Physicians involved in your care Primary care Dr. Marin Comment   Family History  Problem Relation Age of Onset  . Obesity Daughter    Social History   Socioeconomic History  . Marital  status: Single    Spouse name: None  . Number of children: None  . Years of education: None  . Highest education level: None  Social Needs  . Financial resource strain: None  . Food insecurity - worry: None  . Food insecurity - inability: None  . Transportation needs - medical: None  . Transportation needs - non-medical: None  Occupational History  . None  Tobacco Use  . Smoking status: Former Smoker    Packs/day: 1.00    Years: 15.00    Pack years: 15.00  . Smokeless tobacco: Never Used  . Tobacco comment: will address while in IP rehab  Substance and Sexual Activity  . Alcohol use: No  . Drug use: No  . Sexual activity: No    Birth control/protection: Abstinence  Other Topics Concern  . None  Social History Narrative  . None   Past Surgical History:  Procedure Laterality Date  . ABDOMINAL HYSTERECTOMY  1977   for endometrial cancer  . HIP ARTHROPLASTY Left 11/18/2016   Procedure: ARTHROPLASTY BIPOLAR HIP (HEMIARTHROPLASTY);  Surgeon: Nicholes Stairs, MD;  Location: [facility];  Service: Orthopedics;  Laterality: Left;  . LAPAROSCOPIC CHOLECYSTECTOMY SINGLE SITE WITH INTRAOPERATIVE CHOLANGIOGRAM N/A 07/19/2015   Procedure: LAPAROSCOPIC CHOLECYSTECTOMY SINGLE SITE WITH INTRAOPERATIVE CHOLANGIOGRAM;  Surgeon: Michael Boston, MD;  Location: [facility];  Service: General;  Laterality: N/A;  . TONSILLECTOMY    . TUBAL LIGATION     Past Medical History:  Diagnosis Date  . Chronic cholecystitis   . Complication of anesthesia   . Diverticulosis   .  Endometrial cancer ([place]) 1977   s/p TAH  . History of transfusion 1970's  . PONV (postoperative nausea and vomiting)    BP 127/71   Pulse (!) 54   SpO2 97%   Opioid Risk Score:   Fall Risk Score:  `1  Depression screen PHQ 2/9  No flowsheet data found.   Review of Systems  Genitourinary:       Bladder control problem  Neurological:       Loss of taste or smell  All other systems reviewed and are negative.        Objective:   Physical Exam  Constitutional: She is oriented to person, place, and time. She appears well-developed and well-nourished. No distress.  HENT:  Head: Normocephalic and atraumatic.  Eyes: Conjunctivae and EOM are normal. Pupils are equal, round, and reactive to light.  Neck: Normal range of motion. No JVD present.  Cardiovascular: Normal rate, regular rhythm and normal heart sounds.  No murmur heard. Pulmonary/Chest: Effort normal and breath sounds normal. No stridor. No respiratory distress. She has no wheezes.  Abdominal: Soft. Bowel sounds are normal. She exhibits no distension. There is no tenderness.  Musculoskeletal: She exhibits no edema, tenderness or deformity.  Neurological: She is alert and oriented to person, place, and time. A sensory deficit is present. No cranial nerve deficit. Coordination normal.  Speech is mildly disfluent.  Patient has difficulty with word finding she is able to name simple objects but not less frequently use objects such as stethoscope. Motor strength is 4/5 in the right deltoid, bicep, tricep, grip, hip flexor, knee extensor ankle dorsiflexor 5/5 in the left deltoid, bicep, tricep, grip, hip flexor, knee extensor, ankle dorsiflexor Gait is without evidence of toe drag or knee instability.  She has difficulty with heel walking on the right side is not able to dorsiflex her ankle very well. Patient is able to identify which fingers touched on the right side although the 2 sides do not feel equal. There is mild dysdiadochokinesis with rapid alternating movement of the right side  There is normal finger to thumb opposition bilaterally.  Skin: Skin is warm and dry. She is not diaphoretic. No erythema.  Psychiatric: She has a normal mood and affect. Thought content normal. Her speech is delayed. She is slowed.  Nursing note and vitals reviewed.         Assessment & Plan:  #1.  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  Overall she is making improvements.  As discussed with the patient physically I think she will recover well and will likely plateau at around 3 months.  Speech and language take 9-12 months to plateau. We will follow-up in 6 weeks at which point we will likely make referral to outpatient therapy No driving until reassessment. Discussed with patient and her daughter. 2.  Hyperlipidemia follow-up with PCP in a.m.

## 2018-01-08 DIAGNOSIS — Z0000 Encounter for general adult medical examination without abnormal findings: Secondary | ICD-10-CM | POA: Diagnosis not present

## 2018-01-08 DIAGNOSIS — I69320 Aphasia following cerebral infarction: Secondary | ICD-10-CM | POA: Diagnosis not present

## 2018-01-08 DIAGNOSIS — I5032 Chronic diastolic (congestive) heart failure: Secondary | ICD-10-CM | POA: Diagnosis not present

## 2018-01-08 DIAGNOSIS — I110 Hypertensive heart disease with heart failure: Secondary | ICD-10-CM | POA: Diagnosis not present

## 2018-01-08 DIAGNOSIS — Z7982 Long term (current) use of aspirin: Secondary | ICD-10-CM | POA: Diagnosis not present

## 2018-01-08 DIAGNOSIS — I639 Cerebral infarction, unspecified: Secondary | ICD-10-CM | POA: Diagnosis not present

## 2018-01-08 DIAGNOSIS — M810 Age-related osteoporosis without current pathological fracture: Secondary | ICD-10-CM | POA: Diagnosis not present

## 2018-01-08 DIAGNOSIS — E7800 Pure hypercholesterolemia, unspecified: Secondary | ICD-10-CM | POA: Diagnosis not present

## 2018-01-08 DIAGNOSIS — I69351 Hemiplegia and hemiparesis following cerebral infarction affecting right dominant side: Secondary | ICD-10-CM | POA: Diagnosis not present

## 2018-01-08 NOTE — Progress Notes (Signed)
Late Entry for modified Rankin Score that was scored incorrectly.  Based on review of medical record and notes from [facility], PT and Benjiman Core, PTA.    12/17/17 1230  Modified Rankin (Stroke Patients Only)  Pre-Morbid Rankin Score 0  Modified Rankin [name], [name]  [phone] 01/08/2018

## 2018-01-10 DIAGNOSIS — E7800 Pure hypercholesterolemia, unspecified: Secondary | ICD-10-CM | POA: Diagnosis not present

## 2018-01-10 DIAGNOSIS — I69351 Hemiplegia and hemiparesis following cerebral infarction affecting right dominant side: Secondary | ICD-10-CM | POA: Diagnosis not present

## 2018-01-10 DIAGNOSIS — I69320 Aphasia following cerebral infarction: Secondary | ICD-10-CM | POA: Diagnosis not present

## 2018-01-10 DIAGNOSIS — I110 Hypertensive heart disease with heart failure: Secondary | ICD-10-CM | POA: Diagnosis not present

## 2018-01-10 DIAGNOSIS — I5032 Chronic diastolic (congestive) heart failure: Secondary | ICD-10-CM | POA: Diagnosis not present

## 2018-01-10 DIAGNOSIS — Z7982 Long term (current) use of aspirin: Secondary | ICD-10-CM | POA: Diagnosis not present

## 2018-01-13 DIAGNOSIS — I110 Hypertensive heart disease with heart failure: Secondary | ICD-10-CM | POA: Diagnosis not present

## 2018-01-13 DIAGNOSIS — I5032 Chronic diastolic (congestive) heart failure: Secondary | ICD-10-CM | POA: Diagnosis not present

## 2018-01-13 DIAGNOSIS — E7800 Pure hypercholesterolemia, unspecified: Secondary | ICD-10-CM | POA: Diagnosis not present

## 2018-01-13 DIAGNOSIS — Z7982 Long term (current) use of aspirin: Secondary | ICD-10-CM | POA: Diagnosis not present

## 2018-01-13 DIAGNOSIS — I69351 Hemiplegia and hemiparesis following cerebral infarction affecting right dominant side: Secondary | ICD-10-CM | POA: Diagnosis not present

## 2018-01-13 DIAGNOSIS — I69320 Aphasia following cerebral infarction: Secondary | ICD-10-CM | POA: Diagnosis not present

## 2018-01-14 DIAGNOSIS — E7800 Pure hypercholesterolemia, unspecified: Secondary | ICD-10-CM | POA: Diagnosis not present

## 2018-01-14 DIAGNOSIS — I5032 Chronic diastolic (congestive) heart failure: Secondary | ICD-10-CM | POA: Diagnosis not present

## 2018-01-14 DIAGNOSIS — I69320 Aphasia following cerebral infarction: Secondary | ICD-10-CM | POA: Diagnosis not present

## 2018-01-14 DIAGNOSIS — Z7982 Long term (current) use of aspirin: Secondary | ICD-10-CM | POA: Diagnosis not present

## 2018-01-14 DIAGNOSIS — I69351 Hemiplegia and hemiparesis following cerebral infarction affecting right dominant side: Secondary | ICD-10-CM | POA: Diagnosis not present

## 2018-01-14 DIAGNOSIS — I110 Hypertensive heart disease with heart failure: Secondary | ICD-10-CM | POA: Diagnosis not present

## 2018-01-15 DIAGNOSIS — I69320 Aphasia following cerebral infarction: Secondary | ICD-10-CM | POA: Diagnosis not present

## 2018-01-15 DIAGNOSIS — I69351 Hemiplegia and hemiparesis following cerebral infarction affecting right dominant side: Secondary | ICD-10-CM | POA: Diagnosis not present

## 2018-01-15 DIAGNOSIS — I5032 Chronic diastolic (congestive) heart failure: Secondary | ICD-10-CM | POA: Diagnosis not present

## 2018-01-15 DIAGNOSIS — Z7982 Long term (current) use of aspirin: Secondary | ICD-10-CM | POA: Diagnosis not present

## 2018-01-15 DIAGNOSIS — E7800 Pure hypercholesterolemia, unspecified: Secondary | ICD-10-CM | POA: Diagnosis not present

## 2018-01-15 DIAGNOSIS — I110 Hypertensive heart disease with heart failure: Secondary | ICD-10-CM | POA: Diagnosis not present

## 2018-01-17 DIAGNOSIS — E7800 Pure hypercholesterolemia, unspecified: Secondary | ICD-10-CM | POA: Diagnosis not present

## 2018-01-17 DIAGNOSIS — I5032 Chronic diastolic (congestive) heart failure: Secondary | ICD-10-CM | POA: Diagnosis not present

## 2018-01-17 DIAGNOSIS — I110 Hypertensive heart disease with heart failure: Secondary | ICD-10-CM | POA: Diagnosis not present

## 2018-01-17 DIAGNOSIS — Z7982 Long term (current) use of aspirin: Secondary | ICD-10-CM | POA: Diagnosis not present

## 2018-01-17 DIAGNOSIS — I69351 Hemiplegia and hemiparesis following cerebral infarction affecting right dominant side: Secondary | ICD-10-CM | POA: Diagnosis not present

## 2018-01-17 DIAGNOSIS — I69320 Aphasia following cerebral infarction: Secondary | ICD-10-CM | POA: Diagnosis not present

## 2018-01-20 DIAGNOSIS — E7800 Pure hypercholesterolemia, unspecified: Secondary | ICD-10-CM | POA: Diagnosis not present

## 2018-01-20 DIAGNOSIS — I110 Hypertensive heart disease with heart failure: Secondary | ICD-10-CM | POA: Diagnosis not present

## 2018-01-20 DIAGNOSIS — I5032 Chronic diastolic (congestive) heart failure: Secondary | ICD-10-CM | POA: Diagnosis not present

## 2018-01-20 DIAGNOSIS — I69351 Hemiplegia and hemiparesis following cerebral infarction affecting right dominant side: Secondary | ICD-10-CM | POA: Diagnosis not present

## 2018-01-20 DIAGNOSIS — Z7982 Long term (current) use of aspirin: Secondary | ICD-10-CM | POA: Diagnosis not present

## 2018-01-20 DIAGNOSIS — I69320 Aphasia following cerebral infarction: Secondary | ICD-10-CM | POA: Diagnosis not present

## 2018-01-21 DIAGNOSIS — E7800 Pure hypercholesterolemia, unspecified: Secondary | ICD-10-CM | POA: Diagnosis not present

## 2018-01-21 DIAGNOSIS — I110 Hypertensive heart disease with heart failure: Secondary | ICD-10-CM | POA: Diagnosis not present

## 2018-01-21 DIAGNOSIS — I5032 Chronic diastolic (congestive) heart failure: Secondary | ICD-10-CM | POA: Diagnosis not present

## 2018-01-21 DIAGNOSIS — Z7982 Long term (current) use of aspirin: Secondary | ICD-10-CM | POA: Diagnosis not present

## 2018-01-21 DIAGNOSIS — I69351 Hemiplegia and hemiparesis following cerebral infarction affecting right dominant side: Secondary | ICD-10-CM | POA: Diagnosis not present

## 2018-01-21 DIAGNOSIS — I69320 Aphasia following cerebral infarction: Secondary | ICD-10-CM | POA: Diagnosis not present

## 2018-01-22 DIAGNOSIS — I69320 Aphasia following cerebral infarction: Secondary | ICD-10-CM | POA: Diagnosis not present

## 2018-01-22 DIAGNOSIS — E7800 Pure hypercholesterolemia, unspecified: Secondary | ICD-10-CM | POA: Diagnosis not present

## 2018-01-22 DIAGNOSIS — I69351 Hemiplegia and hemiparesis following cerebral infarction affecting right dominant side: Secondary | ICD-10-CM | POA: Diagnosis not present

## 2018-01-22 DIAGNOSIS — Z7982 Long term (current) use of aspirin: Secondary | ICD-10-CM | POA: Diagnosis not present

## 2018-01-22 DIAGNOSIS — I110 Hypertensive heart disease with heart failure: Secondary | ICD-10-CM | POA: Diagnosis not present

## 2018-01-22 DIAGNOSIS — I5032 Chronic diastolic (congestive) heart failure: Secondary | ICD-10-CM | POA: Diagnosis not present

## 2018-01-27 DIAGNOSIS — I69320 Aphasia following cerebral infarction: Secondary | ICD-10-CM | POA: Diagnosis not present

## 2018-01-27 DIAGNOSIS — I5032 Chronic diastolic (congestive) heart failure: Secondary | ICD-10-CM | POA: Diagnosis not present

## 2018-01-27 DIAGNOSIS — I69351 Hemiplegia and hemiparesis following cerebral infarction affecting right dominant side: Secondary | ICD-10-CM | POA: Diagnosis not present

## 2018-01-27 DIAGNOSIS — E7800 Pure hypercholesterolemia, unspecified: Secondary | ICD-10-CM | POA: Diagnosis not present

## 2018-01-27 DIAGNOSIS — Z7982 Long term (current) use of aspirin: Secondary | ICD-10-CM | POA: Diagnosis not present

## 2018-01-27 DIAGNOSIS — I110 Hypertensive heart disease with heart failure: Secondary | ICD-10-CM | POA: Diagnosis not present

## 2018-01-28 DIAGNOSIS — I5032 Chronic diastolic (congestive) heart failure: Secondary | ICD-10-CM | POA: Diagnosis not present

## 2018-01-28 DIAGNOSIS — Z7982 Long term (current) use of aspirin: Secondary | ICD-10-CM | POA: Diagnosis not present

## 2018-01-28 DIAGNOSIS — E7800 Pure hypercholesterolemia, unspecified: Secondary | ICD-10-CM | POA: Diagnosis not present

## 2018-01-28 DIAGNOSIS — I69351 Hemiplegia and hemiparesis following cerebral infarction affecting right dominant side: Secondary | ICD-10-CM | POA: Diagnosis not present

## 2018-01-28 DIAGNOSIS — I69320 Aphasia following cerebral infarction: Secondary | ICD-10-CM | POA: Diagnosis not present

## 2018-01-28 DIAGNOSIS — I110 Hypertensive heart disease with heart failure: Secondary | ICD-10-CM | POA: Diagnosis not present

## 2018-01-29 DIAGNOSIS — I5032 Chronic diastolic (congestive) heart failure: Secondary | ICD-10-CM | POA: Diagnosis not present

## 2018-01-29 DIAGNOSIS — I69351 Hemiplegia and hemiparesis following cerebral infarction affecting right dominant side: Secondary | ICD-10-CM | POA: Diagnosis not present

## 2018-01-29 DIAGNOSIS — I110 Hypertensive heart disease with heart failure: Secondary | ICD-10-CM | POA: Diagnosis not present

## 2018-01-29 DIAGNOSIS — Z7982 Long term (current) use of aspirin: Secondary | ICD-10-CM | POA: Diagnosis not present

## 2018-01-29 DIAGNOSIS — E7800 Pure hypercholesterolemia, unspecified: Secondary | ICD-10-CM | POA: Diagnosis not present

## 2018-01-29 DIAGNOSIS — I69320 Aphasia following cerebral infarction: Secondary | ICD-10-CM | POA: Diagnosis not present

## 2018-01-31 DIAGNOSIS — Z7982 Long term (current) use of aspirin: Secondary | ICD-10-CM | POA: Diagnosis not present

## 2018-01-31 DIAGNOSIS — I5032 Chronic diastolic (congestive) heart failure: Secondary | ICD-10-CM | POA: Diagnosis not present

## 2018-01-31 DIAGNOSIS — E7800 Pure hypercholesterolemia, unspecified: Secondary | ICD-10-CM | POA: Diagnosis not present

## 2018-01-31 DIAGNOSIS — I69351 Hemiplegia and hemiparesis following cerebral infarction affecting right dominant side: Secondary | ICD-10-CM | POA: Diagnosis not present

## 2018-01-31 DIAGNOSIS — I69320 Aphasia following cerebral infarction: Secondary | ICD-10-CM | POA: Diagnosis not present

## 2018-01-31 DIAGNOSIS — I110 Hypertensive heart disease with heart failure: Secondary | ICD-10-CM | POA: Diagnosis not present

## 2018-02-03 DIAGNOSIS — I69351 Hemiplegia and hemiparesis following cerebral infarction affecting right dominant side: Secondary | ICD-10-CM | POA: Diagnosis not present

## 2018-02-03 DIAGNOSIS — I110 Hypertensive heart disease with heart failure: Secondary | ICD-10-CM | POA: Diagnosis not present

## 2018-02-03 DIAGNOSIS — I69320 Aphasia following cerebral infarction: Secondary | ICD-10-CM | POA: Diagnosis not present

## 2018-02-03 DIAGNOSIS — I5032 Chronic diastolic (congestive) heart failure: Secondary | ICD-10-CM | POA: Diagnosis not present

## 2018-02-03 DIAGNOSIS — Z7982 Long term (current) use of aspirin: Secondary | ICD-10-CM | POA: Diagnosis not present

## 2018-02-03 DIAGNOSIS — E7800 Pure hypercholesterolemia, unspecified: Secondary | ICD-10-CM | POA: Diagnosis not present

## 2018-02-04 DIAGNOSIS — I69351 Hemiplegia and hemiparesis following cerebral infarction affecting right dominant side: Secondary | ICD-10-CM | POA: Diagnosis not present

## 2018-02-04 DIAGNOSIS — E7800 Pure hypercholesterolemia, unspecified: Secondary | ICD-10-CM | POA: Diagnosis not present

## 2018-02-04 DIAGNOSIS — I69320 Aphasia following cerebral infarction: Secondary | ICD-10-CM | POA: Diagnosis not present

## 2018-02-04 DIAGNOSIS — Z7982 Long term (current) use of aspirin: Secondary | ICD-10-CM | POA: Diagnosis not present

## 2018-02-04 DIAGNOSIS — I110 Hypertensive heart disease with heart failure: Secondary | ICD-10-CM | POA: Diagnosis not present

## 2018-02-04 DIAGNOSIS — I5032 Chronic diastolic (congestive) heart failure: Secondary | ICD-10-CM | POA: Diagnosis not present

## 2018-02-05 DIAGNOSIS — I69320 Aphasia following cerebral infarction: Secondary | ICD-10-CM | POA: Diagnosis not present

## 2018-02-05 DIAGNOSIS — I5032 Chronic diastolic (congestive) heart failure: Secondary | ICD-10-CM | POA: Diagnosis not present

## 2018-02-05 DIAGNOSIS — Z7982 Long term (current) use of aspirin: Secondary | ICD-10-CM | POA: Diagnosis not present

## 2018-02-05 DIAGNOSIS — I69351 Hemiplegia and hemiparesis following cerebral infarction affecting right dominant side: Secondary | ICD-10-CM | POA: Diagnosis not present

## 2018-02-05 DIAGNOSIS — I110 Hypertensive heart disease with heart failure: Secondary | ICD-10-CM | POA: Diagnosis not present

## 2018-02-05 DIAGNOSIS — E7800 Pure hypercholesterolemia, unspecified: Secondary | ICD-10-CM | POA: Diagnosis not present

## 2018-02-07 DIAGNOSIS — E7800 Pure hypercholesterolemia, unspecified: Secondary | ICD-10-CM | POA: Diagnosis not present

## 2018-02-07 DIAGNOSIS — I5032 Chronic diastolic (congestive) heart failure: Secondary | ICD-10-CM | POA: Diagnosis not present

## 2018-02-07 DIAGNOSIS — I69351 Hemiplegia and hemiparesis following cerebral infarction affecting right dominant side: Secondary | ICD-10-CM | POA: Diagnosis not present

## 2018-02-07 DIAGNOSIS — Z7982 Long term (current) use of aspirin: Secondary | ICD-10-CM | POA: Diagnosis not present

## 2018-02-07 DIAGNOSIS — I69320 Aphasia following cerebral infarction: Secondary | ICD-10-CM | POA: Diagnosis not present

## 2018-02-07 DIAGNOSIS — I110 Hypertensive heart disease with heart failure: Secondary | ICD-10-CM | POA: Diagnosis not present

## 2018-02-11 DIAGNOSIS — E7800 Pure hypercholesterolemia, unspecified: Secondary | ICD-10-CM | POA: Diagnosis not present

## 2018-02-11 DIAGNOSIS — I5032 Chronic diastolic (congestive) heart failure: Secondary | ICD-10-CM | POA: Diagnosis not present

## 2018-02-11 DIAGNOSIS — I110 Hypertensive heart disease with heart failure: Secondary | ICD-10-CM | POA: Diagnosis not present

## 2018-02-11 DIAGNOSIS — Z7982 Long term (current) use of aspirin: Secondary | ICD-10-CM | POA: Diagnosis not present

## 2018-02-11 DIAGNOSIS — I69320 Aphasia following cerebral infarction: Secondary | ICD-10-CM | POA: Diagnosis not present

## 2018-02-11 DIAGNOSIS — I69351 Hemiplegia and hemiparesis following cerebral infarction affecting right dominant side: Secondary | ICD-10-CM | POA: Diagnosis not present

## 2018-02-12 DIAGNOSIS — I5032 Chronic diastolic (congestive) heart failure: Secondary | ICD-10-CM | POA: Diagnosis not present

## 2018-02-12 DIAGNOSIS — Z7982 Long term (current) use of aspirin: Secondary | ICD-10-CM | POA: Diagnosis not present

## 2018-02-12 DIAGNOSIS — I69351 Hemiplegia and hemiparesis following cerebral infarction affecting right dominant side: Secondary | ICD-10-CM | POA: Diagnosis not present

## 2018-02-12 DIAGNOSIS — I69320 Aphasia following cerebral infarction: Secondary | ICD-10-CM | POA: Diagnosis not present

## 2018-02-12 DIAGNOSIS — E7800 Pure hypercholesterolemia, unspecified: Secondary | ICD-10-CM | POA: Diagnosis not present

## 2018-02-12 DIAGNOSIS — I110 Hypertensive heart disease with heart failure: Secondary | ICD-10-CM | POA: Diagnosis not present

## 2018-02-14 DIAGNOSIS — I110 Hypertensive heart disease with heart failure: Secondary | ICD-10-CM | POA: Diagnosis not present

## 2018-02-14 DIAGNOSIS — I69320 Aphasia following cerebral infarction: Secondary | ICD-10-CM | POA: Diagnosis not present

## 2018-02-14 DIAGNOSIS — I69351 Hemiplegia and hemiparesis following cerebral infarction affecting right dominant side: Secondary | ICD-10-CM | POA: Diagnosis not present

## 2018-02-14 DIAGNOSIS — Z7982 Long term (current) use of aspirin: Secondary | ICD-10-CM | POA: Diagnosis not present

## 2018-02-14 DIAGNOSIS — E7800 Pure hypercholesterolemia, unspecified: Secondary | ICD-10-CM | POA: Diagnosis not present

## 2018-02-14 DIAGNOSIS — I5032 Chronic diastolic (congestive) heart failure: Secondary | ICD-10-CM | POA: Diagnosis not present

## 2018-02-17 DIAGNOSIS — I69351 Hemiplegia and hemiparesis following cerebral infarction affecting right dominant side: Secondary | ICD-10-CM | POA: Diagnosis not present

## 2018-02-17 DIAGNOSIS — I69320 Aphasia following cerebral infarction: Secondary | ICD-10-CM | POA: Diagnosis not present

## 2018-02-17 DIAGNOSIS — Z7982 Long term (current) use of aspirin: Secondary | ICD-10-CM | POA: Diagnosis not present

## 2018-02-17 DIAGNOSIS — E7800 Pure hypercholesterolemia, unspecified: Secondary | ICD-10-CM | POA: Diagnosis not present

## 2018-02-17 DIAGNOSIS — I110 Hypertensive heart disease with heart failure: Secondary | ICD-10-CM | POA: Diagnosis not present

## 2018-02-17 DIAGNOSIS — I5032 Chronic diastolic (congestive) heart failure: Secondary | ICD-10-CM | POA: Diagnosis not present

## 2018-02-18 ENCOUNTER — Encounter: Payer: Medicare Other | Attending: Physical Medicine & Rehabilitation

## 2018-02-18 ENCOUNTER — Ambulatory Visit (HOSPITAL_BASED_OUTPATIENT_CLINIC_OR_DEPARTMENT_OTHER): Payer: Medicare Other | Admitting: Physical Medicine & Rehabilitation

## 2018-02-18 ENCOUNTER — Encounter: Payer: Self-pay | Admitting: Physical Medicine & Rehabilitation

## 2018-02-18 VITALS — BP 115/73 | HR 58 | Ht 64.0 in | Wt 128.4 lb

## 2018-02-18 DIAGNOSIS — Z9049 Acquired absence of other specified parts of digestive tract: Secondary | ICD-10-CM | POA: Diagnosis not present

## 2018-02-18 DIAGNOSIS — I69398 Other sequelae of cerebral infarction: Secondary | ICD-10-CM | POA: Diagnosis not present

## 2018-02-18 DIAGNOSIS — Z96642 Presence of left artificial hip joint: Secondary | ICD-10-CM | POA: Diagnosis not present

## 2018-02-18 DIAGNOSIS — Z8542 Personal history of malignant neoplasm of other parts of uterus: Secondary | ICD-10-CM | POA: Insufficient documentation

## 2018-02-18 DIAGNOSIS — R269 Unspecified abnormalities of gait and mobility: Secondary | ICD-10-CM | POA: Diagnosis not present

## 2018-02-18 DIAGNOSIS — R4701 Aphasia: Secondary | ICD-10-CM | POA: Diagnosis not present

## 2018-02-18 DIAGNOSIS — Z87891 Personal history of nicotine dependence: Secondary | ICD-10-CM | POA: Insufficient documentation

## 2018-02-18 DIAGNOSIS — E785 Hyperlipidemia, unspecified: Secondary | ICD-10-CM | POA: Insufficient documentation

## 2018-02-18 DIAGNOSIS — I69320 Aphasia following cerebral infarction: Secondary | ICD-10-CM | POA: Diagnosis not present

## 2018-02-18 DIAGNOSIS — Z90710 Acquired absence of both cervix and uterus: Secondary | ICD-10-CM | POA: Diagnosis not present

## 2018-02-18 DIAGNOSIS — I639 Cerebral infarction, unspecified: Secondary | ICD-10-CM | POA: Diagnosis not present

## 2018-02-18 DIAGNOSIS — I69351 Hemiplegia and hemiparesis following cerebral infarction affecting right dominant side: Secondary | ICD-10-CM | POA: Diagnosis not present

## 2018-02-18 NOTE — Progress Notes (Signed)
Subjective:    Patient ID: [name], female    DOB: [birth year], 72 y.o.   MRN: [id number] 72 year old right-handed female with history of endometrial cancer with hysterectomy as well as tobacco abuse. She lives with her daughter and grandson, independent prior to admission, working as a Radiation protection practitioner. Presented on December 15, 2017, with right-sided weakness, facial droop, and aphasia. Cranial CT scan unremarkable. She did not receive tPA. MRI showed acute nonhemorrhagic infarction of left basal ganglia, corpus striatum, without associated mass effect. MRA with no emergent large vessel occlusion or stenosis. Echocardiogram with ejection fraction of 55%, grade 1 diastolic dysfunction. Neurology consulted, placed on aspirin for CVA prophylaxis  HPI No falls Pt feels back to normal with movement and speech No vision problems No swallowing issues No numbness or tingling Doing some PT book keeping  Pain Inventory Average Pain 0 Pain Right Now 0 My pain is na  In the last 24 hours, has pain interfered with the following? General activity 0 Relation with others 0 Enjoyment of life 0 What TIME of day is your pain at its worst? na Sleep (in general) Poor  Pain is worse with: na Pain improves with: na Relief from Meds: na  Mobility walk without assistance ability to climb steps?  yes  Function Do you have any goals in this area?  no  Neuro/Psych No problems in this area  Prior Studies Any changes since last visit?  no  Physicians involved in your care Any changes since last visit?  no   Family History  Problem Relation Age of Onset  . Obesity Daughter    Social History   Socioeconomic History  . Marital status: Single    Spouse name: Not on file  . Number of children: Not on file  . Years of education: Not on file  . Highest education level: Not on file  Occupational History  . Not on file  Social Needs  . Financial resource strain: Not on file  .  Food insecurity:    Worry: Not on file    Inability: Not on file  . Transportation needs:    Medical: Not on file    Non-medical: Not on file  Tobacco Use  . Smoking status: Former Smoker    Packs/day: 1.00    Years: 15.00    Pack years: 15.00    Last attempt to quit: 12/17/2017    Years since quitting: 0.1  . Smokeless tobacco: Never Used  . Tobacco comment: will address while in IP rehab  Substance and Sexual Activity  . Alcohol use: No  . Drug use: No  . Sexual activity: Never    Birth control/protection: Abstinence  Lifestyle  . Physical activity:    Days per week: Not on file    Minutes per session: Not on file  . Stress: Not on file  Relationships  . Social connections:    Talks on phone: Not on file    Gets together: Not on file    Attends religious service: Not on file    Active member of club or organization: Not on file    Attends meetings of clubs or organizations: Not on file    Relationship status: Not on file  Other Topics Concern  . Not on file  Social History Narrative  . Not on file   Past Surgical History:  Procedure Laterality Date  . ABDOMINAL HYSTERECTOMY  1977   for endometrial cancer  . HIP ARTHROPLASTY Left 11/18/2016   Procedure:  ARTHROPLASTY BIPOLAR HIP (HEMIARTHROPLASTY);  Surgeon: Nicholes Stairs, MD;  Location: [facility];  Service: Orthopedics;  Laterality: Left;  . LAPAROSCOPIC CHOLECYSTECTOMY SINGLE SITE WITH INTRAOPERATIVE CHOLANGIOGRAM N/A 07/19/2015   Procedure: LAPAROSCOPIC CHOLECYSTECTOMY SINGLE SITE WITH INTRAOPERATIVE CHOLANGIOGRAM;  Surgeon: Michael Boston, MD;  [facility];  Service: General;  Laterality: N/A;  . TONSILLECTOMY    . TUBAL LIGATION     Past Medical History:  Diagnosis Date  . Chronic cholecystitis   . Complication of anesthesia   . Diverticulosis   . Endometrial cancer ([place]) 1977   s/p TAH  . History of transfusion 1970's  . PONV (postoperative nausea and vomiting)    There were no vitals taken for  this visit.  Opioid Risk Score:   Fall Risk Score:  `1  Depression screen PHQ 2/9  Depression screen PHQ 2/9 01/07/2018  Decreased Interest 0  Down, Depressed, Hopeless 0  PHQ - 2 Score 0  Altered sleeping 0  Tired, decreased energy 0  Change in appetite 0  Feeling bad or failure about yourself  0  Trouble concentrating 0  Moving slowly or fidgety/restless 0  Suicidal thoughts 0  PHQ-9 Score 0     Review of Systems  Constitutional: Negative.   HENT: Negative.   Eyes: Negative.   Respiratory: Negative.   Cardiovascular: Negative.   Gastrointestinal: Negative.   Endocrine: Negative.   Genitourinary: Negative.   Musculoskeletal: Negative.   Skin: Negative.   Allergic/Immunologic: Negative.   Neurological: Negative.   Hematological: Negative.   Psychiatric/Behavioral: Negative.   All other systems reviewed and are negative.      Objective:   Physical Exam  Constitutional: She appears well-developed and well-nourished.  HENT:  Head: Normocephalic and atraumatic.  Eyes: Pupils are equal, round, and reactive to light. Conjunctivae and EOM are normal.  Neck: Normal range of motion.  Cardiovascular: Regular rhythm and normal heart sounds.  No murmur heard. Pulmonary/Chest: Effort normal and breath sounds normal. No respiratory distress. She has no wheezes.  Abdominal: Soft. Bowel sounds are normal. She exhibits no distension. There is no tenderness.  Neurological: She is alert.  Oriented to person place month year day but not date. She is able to do serial sevens down to 79 She remembers 2 out of 3 objects after delayed.  She exhibits mild anomia  Motor strength is 5/5 bilateral deltoid bicep tricep grip hip flexion knee extension ankle dorsiflexor Visual fields are intact to confrontation testing There is no diplopia on lateral gaze no evidence of nystagmus.  Gait is without evidence of toe drag or knee instability.  She is able to toe walk but has difficulty with  heel walk.  Also has some difficulty with tandem gait.  Skin: Skin is warm and dry.  Nursing note and vitals reviewed.         Assessment & Plan:  1.  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  Overall she is making improvements.  As discussed with the patient physically I think she will recover well and will likely plateau at around 3 months.  Speech and language take 9-12 months to plateau. At this point she is back close to her baseline.  She has some mild naming difficulties which I think is impacting her recall testing. Graduated return to driving instructions were provided. It is recommended that the patient first drives with another licensed driver in an empty parking lot. If the patient does well with this, and  they can drive on a quiet street with the licensed driver. If the patient does well with this they can drive on a busy street with a licensed driver.  Recommend supervised driving for the first month. For the first month after resuming driving, I recommend no nighttime or Interstate driving.  Discussed with patient and her daughter. Physical medicine rehab follow-up on as needed basis

## 2018-02-18 NOTE — Patient Instructions (Signed)

## 2018-02-19 DIAGNOSIS — Z7982 Long term (current) use of aspirin: Secondary | ICD-10-CM | POA: Diagnosis not present

## 2018-02-19 DIAGNOSIS — E7800 Pure hypercholesterolemia, unspecified: Secondary | ICD-10-CM | POA: Diagnosis not present

## 2018-02-19 DIAGNOSIS — I110 Hypertensive heart disease with heart failure: Secondary | ICD-10-CM | POA: Diagnosis not present

## 2018-02-19 DIAGNOSIS — I69351 Hemiplegia and hemiparesis following cerebral infarction affecting right dominant side: Secondary | ICD-10-CM | POA: Diagnosis not present

## 2018-02-19 DIAGNOSIS — I5032 Chronic diastolic (congestive) heart failure: Secondary | ICD-10-CM | POA: Diagnosis not present

## 2018-02-19 DIAGNOSIS — I69320 Aphasia following cerebral infarction: Secondary | ICD-10-CM | POA: Diagnosis not present

## 2018-02-20 DIAGNOSIS — E7800 Pure hypercholesterolemia, unspecified: Secondary | ICD-10-CM | POA: Diagnosis not present

## 2018-02-20 DIAGNOSIS — I5032 Chronic diastolic (congestive) heart failure: Secondary | ICD-10-CM | POA: Diagnosis not present

## 2018-02-20 DIAGNOSIS — Z7982 Long term (current) use of aspirin: Secondary | ICD-10-CM | POA: Diagnosis not present

## 2018-02-20 DIAGNOSIS — I69320 Aphasia following cerebral infarction: Secondary | ICD-10-CM | POA: Diagnosis not present

## 2018-02-20 DIAGNOSIS — I69351 Hemiplegia and hemiparesis following cerebral infarction affecting right dominant side: Secondary | ICD-10-CM | POA: Diagnosis not present

## 2018-02-20 DIAGNOSIS — I110 Hypertensive heart disease with heart failure: Secondary | ICD-10-CM | POA: Diagnosis not present

## 2018-02-21 DIAGNOSIS — Z7982 Long term (current) use of aspirin: Secondary | ICD-10-CM | POA: Diagnosis not present

## 2018-02-21 DIAGNOSIS — I5032 Chronic diastolic (congestive) heart failure: Secondary | ICD-10-CM | POA: Diagnosis not present

## 2018-02-21 DIAGNOSIS — I69351 Hemiplegia and hemiparesis following cerebral infarction affecting right dominant side: Secondary | ICD-10-CM | POA: Diagnosis not present

## 2018-02-21 DIAGNOSIS — I69320 Aphasia following cerebral infarction: Secondary | ICD-10-CM | POA: Diagnosis not present

## 2018-02-21 DIAGNOSIS — I110 Hypertensive heart disease with heart failure: Secondary | ICD-10-CM | POA: Diagnosis not present

## 2018-02-21 DIAGNOSIS — E7800 Pure hypercholesterolemia, unspecified: Secondary | ICD-10-CM | POA: Diagnosis not present

## 2018-03-05 ENCOUNTER — Ambulatory Visit (INDEPENDENT_AMBULATORY_CARE_PROVIDER_SITE_OTHER): Payer: Medicare Other | Admitting: Neurology

## 2018-03-05 ENCOUNTER — Encounter: Payer: Self-pay | Admitting: Neurology

## 2018-03-05 VITALS — BP 136/58 | HR 52 | Ht 64.0 in | Wt 130.0 lb

## 2018-03-05 DIAGNOSIS — I6381 Other cerebral infarction due to occlusion or stenosis of small artery: Secondary | ICD-10-CM | POA: Diagnosis not present

## 2018-03-05 NOTE — Patient Instructions (Signed)
I had a long d/w patient about her recent lacunar stroke, risk for recurrent stroke/TIAs, personally independently reviewed imaging studies and stroke evaluation results and answered questions.Continue aspirin 325 mg daily  for secondary stroke prevention and maintain strict control of hypertension with blood pressure goal below 130/90, diabetes with hemoglobin A1c goal below 6.5% and lipids with LDL cholesterol goal below 70 mg/dL. I also advised the patient to eat a healthy diet with plenty of whole grains, cereals, fruits and vegetables, exercise regularly and maintain ideal body weight.I complimented the patient on her smoking cessation and encouraged her to continue to do so. Check lipid profile and liver function tests today. If her LDL is quite below the target may consider reducing the dose. Followup in the future with my nurse practitioner Janett Billow in 3 months or call earlier if necessary   Stroke Prevention Some medical conditions and behaviors are associated with a higher chance of having a stroke. You can help prevent a stroke by making nutrition, lifestyle, and other changes, including managing any medical conditions you may have. What nutrition changes can be made?  Eat healthy foods. You can do this by: ? Choosing foods high in fiber, such as fresh fruits and vegetables and whole grains. ? Eating at least 5 or more servings of fruits and vegetables a day. Try to fill half of your plate at each meal with fruits and vegetables. ? Choosing lean protein foods, such as lean cuts of meat, poultry without skin, fish, tofu, beans, and nuts. ? Eating low-fat dairy products. ? Avoiding foods that are high in salt (sodium). This can help lower blood pressure. ? Avoiding foods that have saturated fat, trans fat, and cholesterol. This can help prevent high cholesterol. ? Avoiding processed and premade foods.  Follow your health care provider's specific guidelines for losing weight, controlling high  blood pressure (hypertension), lowering high cholesterol, and managing diabetes. These may include: ? Reducing your daily calorie intake. ? Limiting your daily sodium intake to 1,500 milligrams (mg). ? Using only healthy fats for cooking, such as olive oil, canola oil, or sunflower oil. ? Counting your daily carbohydrate intake. What lifestyle changes can be made?  Maintain a healthy weight. Talk to your health care provider about your ideal weight.  Get at least 30 minutes of moderate physical activity at least 5 days a week. Moderate activity includes brisk walking, biking, and swimming.  Do not use any products that contain nicotine or tobacco, such as cigarettes and e-cigarettes. If you need help quitting, ask your health care provider. It may also be helpful to avoid exposure to secondhand smoke.  Limit alcohol intake to no more than 1 drink a day for nonpregnant women and 2 drinks a day for men. One drink equals 12 oz of beer, 5 oz of wine, or 1 oz of hard liquor.  Stop any illegal drug use.  Avoid taking birth control pills. Talk to your health care provider about the risks of taking birth control pills if: ? You are over 69 years old. ? You smoke. ? You get migraines. ? You have ever had a blood clot. What other changes can be made?  Manage your cholesterol levels. ? Eating a healthy diet is important for preventing high cholesterol. If cholesterol cannot be managed through diet alone, you may also need to take medicines. ? Take any prescribed medicines to control your cholesterol as told by your health care provider.  Manage your diabetes. ? Eating a healthy diet and  exercising regularly are important parts of managing your blood sugar. If your blood sugar cannot be managed through diet and exercise, you may need to take medicines. ? Take any prescribed medicines to control your diabetes as told by your health care provider.  Control your hypertension. ? To reduce your risk  of stroke, try to keep your blood pressure below 130/80. ? Eating a healthy diet and exercising regularly are an important part of controlling your blood pressure. If your blood pressure cannot be managed through diet and exercise, you may need to take medicines. ? Take any prescribed medicines to control hypertension as told by your health care provider. ? Ask your health care provider if you should monitor your blood pressure at home. ? Have your blood pressure checked every year, even if your blood pressure is normal. Blood pressure increases with age and some medical conditions.  Get evaluated for sleep disorders (sleep apnea). Talk to your health care provider about getting a sleep evaluation if you snore a lot or have excessive sleepiness.  Take over-the-counter and prescription medicines only as told by your health care provider. Aspirin or blood thinners (antiplatelets or anticoagulants) may be recommended to reduce your risk of forming blood clots that can lead to stroke.  Make sure that any other medical conditions you have, such as atrial fibrillation or atherosclerosis, are managed. What are the warning signs of a stroke? The warning signs of a stroke can be easily remembered as BEFAST.  B is for balance. Signs include: ? Dizziness. ? Loss of balance or coordination. ? Sudden trouble walking.  E is for eyes. Signs include: ? A sudden change in vision. ? Trouble seeing.  F is for face. Signs include: ? Sudden weakness or numbness of the face. ? The face or eyelid drooping to one side.  A is for arms. Signs include: ? Sudden weakness or numbness of the arm, usually on one side of the body.  S is for speech. Signs include: ? Trouble speaking (aphasia). ? Trouble understanding.  T is for time. ? These symptoms may represent a serious problem that is an emergency. Do not wait to see if the symptoms will go away. Get medical help right away. Call your local emergency services  (911 in the [place] Do not drive yourself to the hospital.  Other signs of stroke may include: ? A sudden, severe headache with no known cause. ? Nausea or vomiting. ? Seizure.  Where to find more information: For more information, visit:  American Stroke Association: www.strokeassociation.org  National Stroke Association: www.stroke.org  Summary  You can prevent a stroke by eating healthy, exercising, not smoking, limiting alcohol intake, and managing any medical conditions you may have.  Do not use any products that contain nicotine or tobacco, such as cigarettes and e-cigarettes. If you need help quitting, ask your health care provider. It may also be helpful to avoid exposure to secondhand smoke.  Remember BEFAST for warning signs of stroke. Get help right away if you or a loved one has any of these signs. This information is not intended to replace advice given to you by your health care provider. Make sure you discuss any questions you have with your health care provider. Document Released: 12/20/2004 Document Revised: 12/18/2016 Document Reviewed: 12/18/2016 Elsevier Interactive Patient Education  [employer].

## 2018-03-05 NOTE — Progress Notes (Signed)
[facility] [address] [place]. [place] [address] [phone]       OFFICE FOLLOW-UP NOTE  [name] Date of Birth:  1946-04-18 Medical Record Number:  [id number]   HPI: [name] is a pleasant 72 year old lady seen today for first office follow-up visit following hospital admission for stroke in January 2019. History is obtained from the patient and review of electronic medical records. I personally reviewed imaging films. Debra Crossis an 72 y.o.femalehistory of endometrial cancer, cholecystitis, smokerwho presents with sudden onset aphasia and right-sided weakness. Around 7 PM the patient was having dinner and suddenly noticed that she had facial droop and right arm and leg weakness. The patient also states she had difficulty getting words out and slurred speech. EMS was called and her symptoms have been resolving at the time she arrived. She was initially scored NIH 2 on arrival for mild drift and sensory symptoms however eventually her symptoms resolved completely by this and she complete her CT scan. CT head was negative for any hemorrhage. Date last known well: 12/15/17 Time last known well:7pm tPA Given:no, resolved symptoms NIHSS:0 Baseline MRS0 CT scan of the head on admission showed no acute abnormality. Patient's aphasia and right-sided weakness improved fairly quickly after admission. MRI scan showed an acute nonhemorrhagic infarct involving the left basal ganglia. There are changes of chronic ischemic small vessel disease. MRA of the brain and neck did not show any significant large vessel extracranial intracranial stenosis. Transthoracic embolism. LDL cholesterol was elevated at 124 mg percent. Hemoglobin A1c was 5.1. Telemetry monitoring did not reveal any cardiac arrhythmias. Patient was started on aspirin and on Lipitor. She was also counseled to quit smoking. Patient states she's done well since discharge and has made almost a full recovery.  She occasionally has difficult time completing sentences particularly when she is tired. She has finished home physical occupational speech therapy. She is independent in activities of daily living. She has quit smoking 2 months ago. She has no new complaints.  ROS:   14 system review of systems is positive for  Tremor only and all other systems negative  PMH:  Past Medical History:  Diagnosis Date  . Chronic cholecystitis   . Complication of anesthesia   . Diverticulosis   . Endometrial cancer ([place]) 1977   s/p TAH  . History of transfusion 1970's  . PONV (postoperative nausea and vomiting)     Social History:  Social History   Socioeconomic History  . Marital status: Single    Spouse name: Not on file  . Number of children: Not on file  . Years of education: Not on file  . Highest education level: Not on file  Occupational History  . Not on file  Social Needs  . Financial resource strain: Not on file  . Food insecurity:    Worry: Not on file    Inability: Not on file  . Transportation needs:    Medical: Not on file    Non-medical: Not on file  Tobacco Use  . Smoking status: Former Smoker    Packs/day: 1.00    Years: 15.00    Pack years: 15.00    Last attempt to quit: 12/17/2017    Years since quitting: 0.2  . Smokeless tobacco: Never Used  . Tobacco comment: will address while in IP rehab  Substance and Sexual Activity  . Alcohol use: No  . Drug use: No  . Sexual activity: Never    Birth control/protection: Abstinence  Lifestyle  .  Physical activity:    Days per week: Not on file    Minutes per session: Not on file  . Stress: Not on file  Relationships  . Social connections:    Talks on phone: Not on file    Gets together: Not on file    Attends religious service: Not on file    Active member of club or organization: Not on file    Attends meetings of clubs or organizations: Not on file    Relationship status: Not on file  . Intimate partner violence:     Fear of current or ex partner: Not on file    Emotionally abused: Not on file    Physically abused: Not on file    Forced sexual activity: Not on file  Other Topics Concern  . Not on file  Social History Narrative  . Not on file    Medications:   Current Outpatient Medications on File Prior to Visit  Medication Sig Dispense Refill  . aspirin 325 MG tablet Take 1 tablet (325 mg total) by mouth daily.    Marland Kitchen atorvastatin (LIPITOR) 80 MG tablet Take 1 tablet (80 mg total) by mouth daily at 6 PM. 30 tablet 0  . brinzolamide (AZOPT) 1 % ophthalmic suspension Place 1 drop into both eyes 2 (two) times daily. 10 mL 12  . Cyanocobalamin (VITAMIN B-12 PO) Take 1 tablet by mouth daily.    . Latanoprostene Bunod (VYZULTA) 0.024 % SOLN Place 1 drop into both eyes at bedtime.    . Multiple Vitamin (MULTIVITAMIN WITH MINERALS) TABS tablet Take 1 tablet by mouth daily.    Marland Kitchen senna-docusate (SENOKOT-S) 8.6-50 MG tablet Take 1 tablet by mouth at bedtime as needed for mild constipation.     No current facility-administered medications on file prior to visit.     Allergies:   Allergies  Allergen Reactions  . Penicillins Nausea And Vomiting    Has patient had a PCN reaction causing immediate rash, facial/tongue/throat swelling, SOB or lightheadedness with hypotension: no Has patient had a PCN reaction causing severe rash involving mucus membranes or skin necrosis:no Has patient had a PCN reaction that required hospitalization no Has patient had a PCN reaction occurring within the last 10 years: no If all of the above answers are "NO", then may proceed with Cephalosporin use.    Physical Exam General: well developed, well nourished,elderly Caucasian lady seated, in no evident distress Head: head normocephalic and atraumatic.  Neck: supple with soft right carotid   bruit Cardiovascular: regular rate and rhythm, no murmurs Musculoskeletal: no deformity Skin:  no rash/petichiae Vascular:  Normal  pulses all extremities Vitals:   03/05/18 1052  BP: (!) 136/58  Pulse: (!) 52   Neurologic Exam Mental Status: Awake and fully alert. Oriented to place and time. Recent and remote memory intact. Attention span, concentration and fund of knowledge appropriate. Mood and affect appropriate.  Cranial Nerves: Fundoscopic exam reveals sharp disc margins. Pupils equal, briskly reactive to light. Extraocular movements full without nystagmus. Visual fields full to confrontation. Hearing intact. Facial sensation intact. Face, tongue, palate moves normally and symmetrically.  Motor: Normal bulk and tone. Normal strength in all tested extremity muscles.diminished fine finger movements on the right. Orbits left over right upper extremity.mild action tremor left upper extremity. Absent at rest. Sensory.: intact to touch ,pinprick .position and vibratory sensation.  Coordination: Rapid alternating movements normal in all extremities. Finger-to-nose and heel-to-shin performed accurately bilaterally. Gait and Station: Arises from chair without difficulty.  Stance is normal. Gait demonstrates normal stride length and balance . Able to heel, toe and tandem walk with great difficulty.  Reflexes: 1+ and symmetric. Toes downgoing.   NIHSS  0 Modified Rankin 1   ASSESSMENT: 64 year lady with left basal ganglia lacunar infarct in July 2019 due to small vessel disease. Vascular risk factors of hyperlipidemia and smoking only.    PLAN: I had a long d/w patient and her daughter about her recent lacunar stroke, risk for recurrent stroke/TIAs, personally independently reviewed imaging studies and stroke evaluation results and answered questions.Continue aspirin 325 mg daily  for secondary stroke prevention and maintain strict control of hypertension with blood pressure goal below 130/90, diabetes with hemoglobin A1c goal below 6.5% and lipids with LDL cholesterol goal below 70 mg/dL. I also advised the patient to eat a  healthy diet with plenty of whole grains, cereals, fruits and vegetables, exercise regularly and maintain ideal body weight.I complimented the patient on her smoking cessation and encouraged her to continue to do so. Check lipid profile and liver function tests today. If her LDL is quite below the target may consider reducing the dose. Followup in the future with my nurse practitioner Janett Billow in 3 months or call earlier if necessary.Greater than 50% of time during this 25 minute visit was spent on counseling,explanation of diagnosis off lacunar infarct, planning of further management, discussion with patient and family and coordination of care Antony Contras, MD  [facility] [address] [place],  [address]  Phone [phone] Fax [phone] Note: This document was prepared with digital dictation and possible smart phrase technology. Any transcriptional errors that result from this process are unintentional

## 2018-03-06 ENCOUNTER — Telehealth: Payer: Self-pay | Admitting: Neurology

## 2018-03-06 LAB — HEPATIC FUNCTION PANEL
ALK PHOS: 141 IU/L — AB (ref 39–117)
ALT: 65 IU/L — AB (ref 0–32)
AST: 52 IU/L — ABNORMAL HIGH (ref 0–40)
Albumin: 3.8 g/dL (ref 3.5–4.8)
BILIRUBIN TOTAL: 0.3 mg/dL (ref 0.0–1.2)
BILIRUBIN, DIRECT: 0.12 mg/dL (ref 0.00–0.40)
Total Protein: 6.2 g/dL (ref 6.0–8.5)

## 2018-03-06 LAB — LIPID PANEL
Chol/HDL Ratio: 2.3 ratio (ref 0.0–4.4)
Cholesterol, Total: 106 mg/dL (ref 100–199)
HDL: 46 mg/dL (ref >39)
LDL Calculated: 47 mg/dL (ref 0–99)
TRIGLYCERIDES: 64 mg/dL (ref 0–149)
VLDL Cholesterol Cal: 13 mg/dL (ref 5–40)

## 2018-03-06 NOTE — Telephone Encounter (Signed)
-----   Message from Garvin Fila, MD sent at 03/06/2018  8:07 AM EDT ----- [name] inform the patient that her cholesterol profile is satisfactory. Her liver enzymes continue to be slightly elevated and compared to 2 months ago this has slightly worsened. Advise her to see her primary care physician to follow this closely and make medication changes as necessary.

## 2018-03-06 NOTE — Telephone Encounter (Signed)
Called the patient and went over the lab work with her. I informed her of her cholesterol levels and how they were in a good range. I did also inform her that her liver enzymes were elevated compared to labs that were done 2 mths ago. I have instructed the patient that she should touch base with her PCP and have them follow that closer. I have also informed the pt that I have sent a copy of the lab work to the PCP. Pt verbalized understanding. Pt had no questions at this time but was encouraged to call back if questions arise.

## 2018-04-01 DIAGNOSIS — M810 Age-related osteoporosis without current pathological fracture: Secondary | ICD-10-CM | POA: Diagnosis not present

## 2018-04-04 DIAGNOSIS — R748 Abnormal levels of other serum enzymes: Secondary | ICD-10-CM | POA: Diagnosis not present

## 2018-06-05 ENCOUNTER — Ambulatory Visit: Payer: Medicare Other | Admitting: Adult Health

## 2018-07-08 DIAGNOSIS — I639 Cerebral infarction, unspecified: Secondary | ICD-10-CM | POA: Diagnosis not present

## 2018-07-10 DIAGNOSIS — I639 Cerebral infarction, unspecified: Secondary | ICD-10-CM | POA: Diagnosis not present

## 2018-07-21 DIAGNOSIS — H401111 Primary open-angle glaucoma, right eye, mild stage: Secondary | ICD-10-CM | POA: Diagnosis not present

## 2018-09-03 DIAGNOSIS — Z23 Encounter for immunization: Secondary | ICD-10-CM | POA: Diagnosis not present

## 2018-10-27 DIAGNOSIS — H401111 Primary open-angle glaucoma, right eye, mild stage: Secondary | ICD-10-CM | POA: Diagnosis not present

## 2018-11-04 DIAGNOSIS — J018 Other acute sinusitis: Secondary | ICD-10-CM | POA: Diagnosis not present

## 2019-01-13 DIAGNOSIS — M810 Age-related osteoporosis without current pathological fracture: Secondary | ICD-10-CM | POA: Diagnosis not present

## 2019-01-13 DIAGNOSIS — Z0000 Encounter for general adult medical examination without abnormal findings: Secondary | ICD-10-CM | POA: Diagnosis not present

## 2019-01-13 DIAGNOSIS — N951 Menopausal and female climacteric states: Secondary | ICD-10-CM | POA: Diagnosis not present

## 2019-01-13 DIAGNOSIS — E559 Vitamin D deficiency, unspecified: Secondary | ICD-10-CM | POA: Diagnosis not present

## 2019-01-13 DIAGNOSIS — I639 Cerebral infarction, unspecified: Secondary | ICD-10-CM | POA: Diagnosis not present

## 2019-02-23 DIAGNOSIS — W19XXXA Unspecified fall, initial encounter: Secondary | ICD-10-CM | POA: Diagnosis not present

## 2019-02-23 DIAGNOSIS — R300 Dysuria: Secondary | ICD-10-CM | POA: Diagnosis not present

## 2019-05-06 DIAGNOSIS — H401111 Primary open-angle glaucoma, right eye, mild stage: Secondary | ICD-10-CM | POA: Diagnosis not present

## 2019-05-08 DIAGNOSIS — N3001 Acute cystitis with hematuria: Secondary | ICD-10-CM | POA: Diagnosis not present

## 2019-05-08 DIAGNOSIS — R399 Unspecified symptoms and signs involving the genitourinary system: Secondary | ICD-10-CM | POA: Diagnosis not present

## 2019-07-14 DIAGNOSIS — E559 Vitamin D deficiency, unspecified: Secondary | ICD-10-CM | POA: Diagnosis not present

## 2019-07-14 DIAGNOSIS — I639 Cerebral infarction, unspecified: Secondary | ICD-10-CM | POA: Diagnosis not present

## 2019-07-20 IMAGING — CT CT HEAD CODE STROKE
3 series · 15 of 47 positions shown, 18 images · non-contrast
Comparison: None.

CLINICAL DATA: Code stroke. Right-sided deficit, confusion, aphasia

EXAM:
CT HEAD WITHOUT CONTRAST
TECHNIQUE: Contiguous axial images were obtained from the base of the skull
through the vertex without intravenous contrast.

[Series 3: head 5.0 st · axial · 0.45mm/px · z∈[-245,-100]mm · 9 of 35 slices shown, 12 images]
[im 3/35  brain]
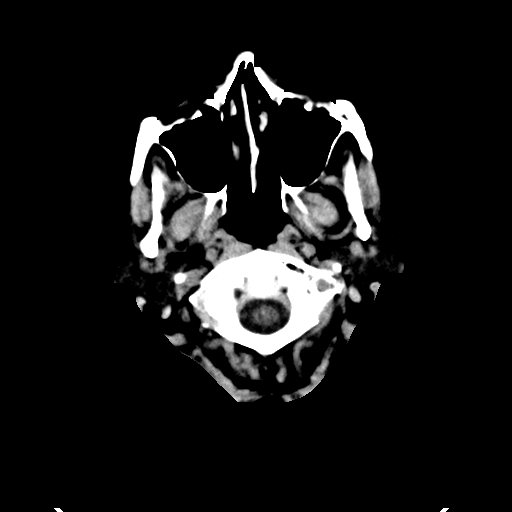
[im 3/35  bone]
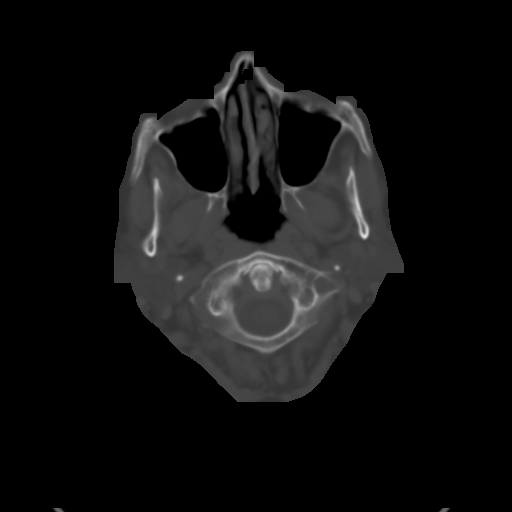
[im 6/35  brain]
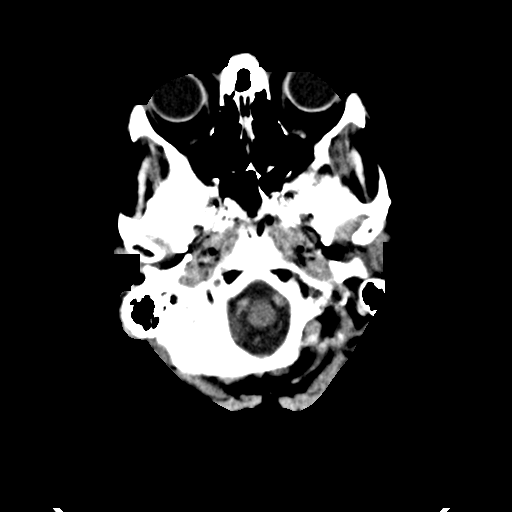
[im 10/35  brain]
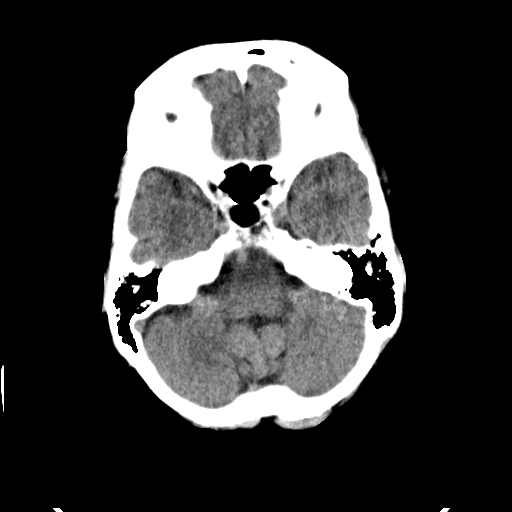
[im 13/35  brain]
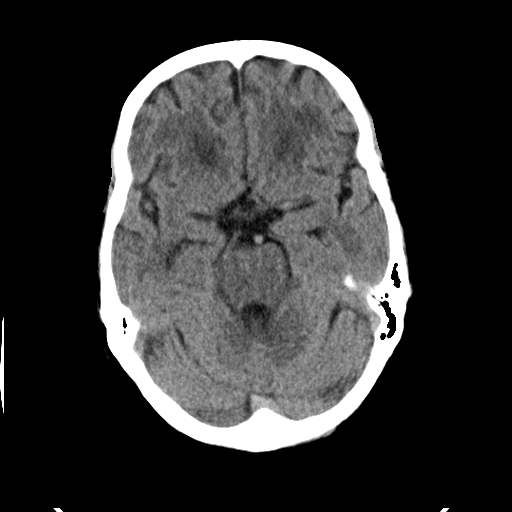
[im 18/35  brain]
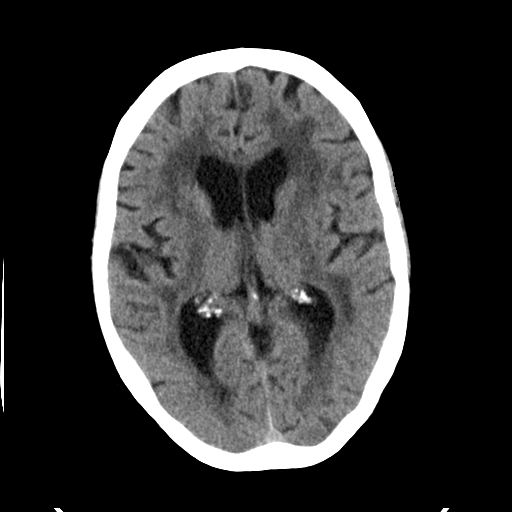
[im 18/35  bone]
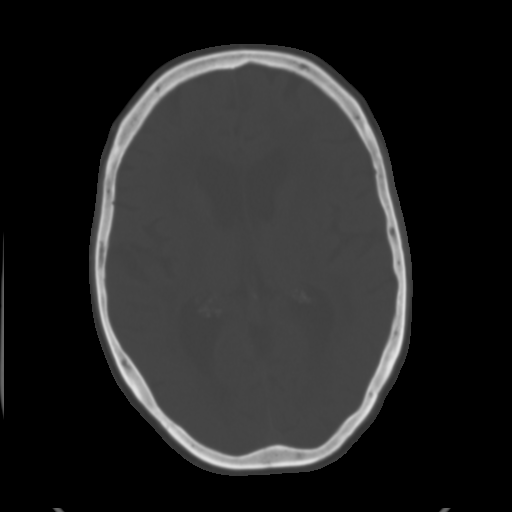
[im 22/35  brain]
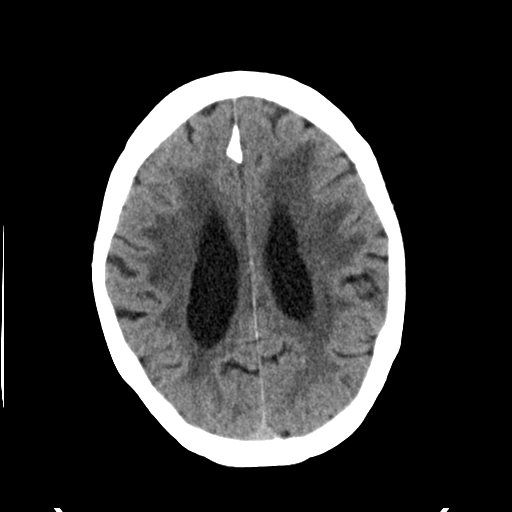
[im 25/35  brain]
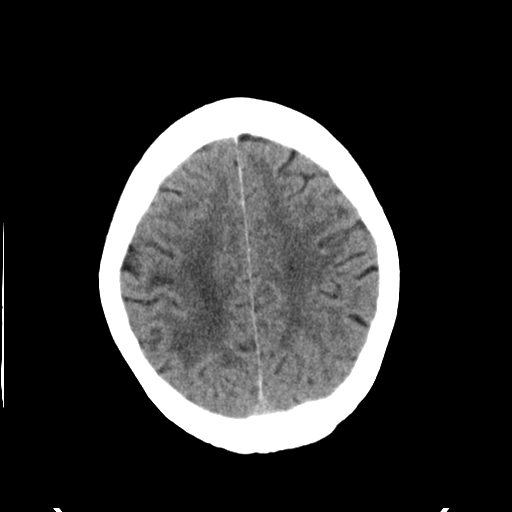
[im 29/35  brain]
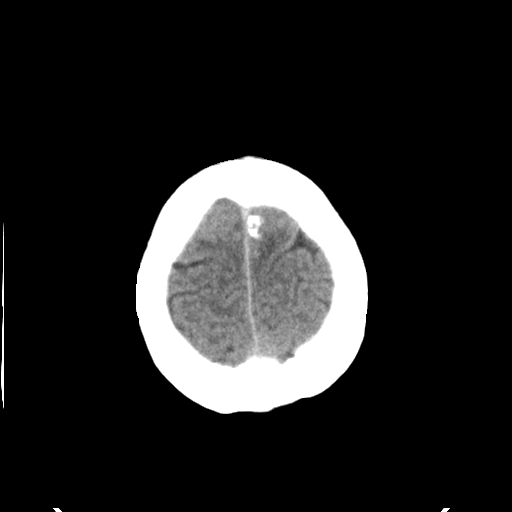
[im 32/35  brain]
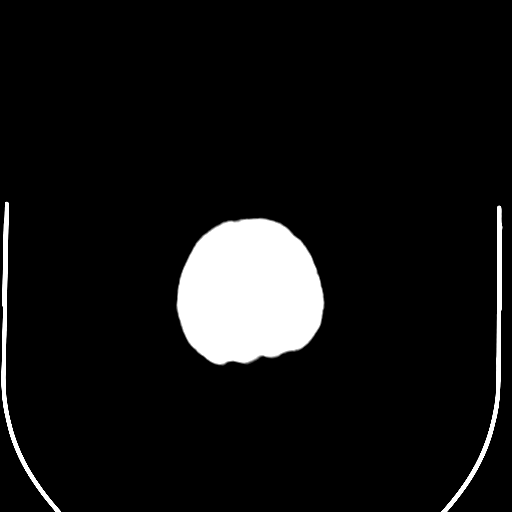
[im 32/35  bone]
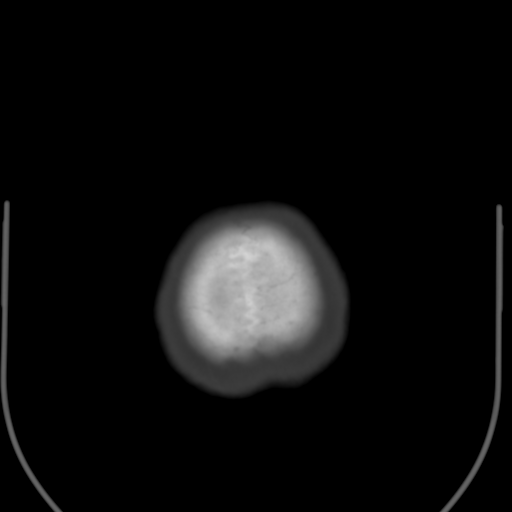

[Series 5: head 3.0 cor st · coronal · 0.34mm/px · 3 of 67 slices shown]
[im 23/67  brain]
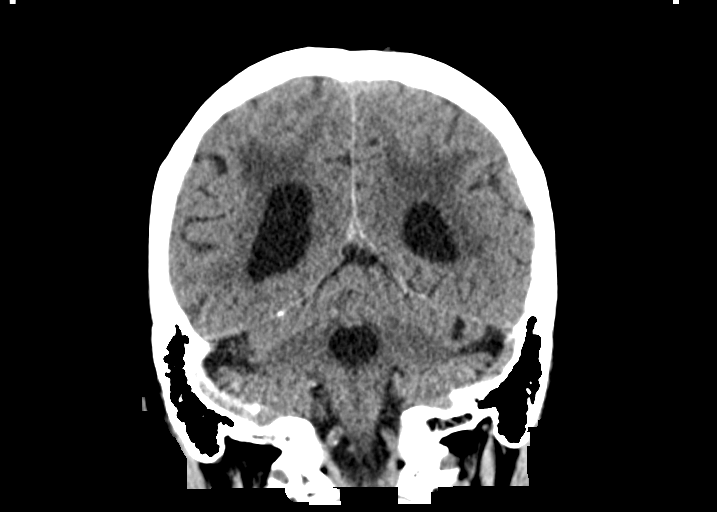
[im 30/67  brain]
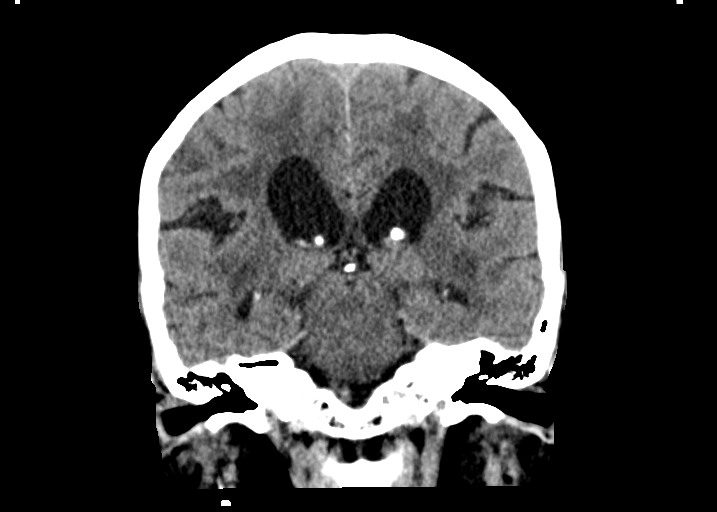
[im 37/67  brain]
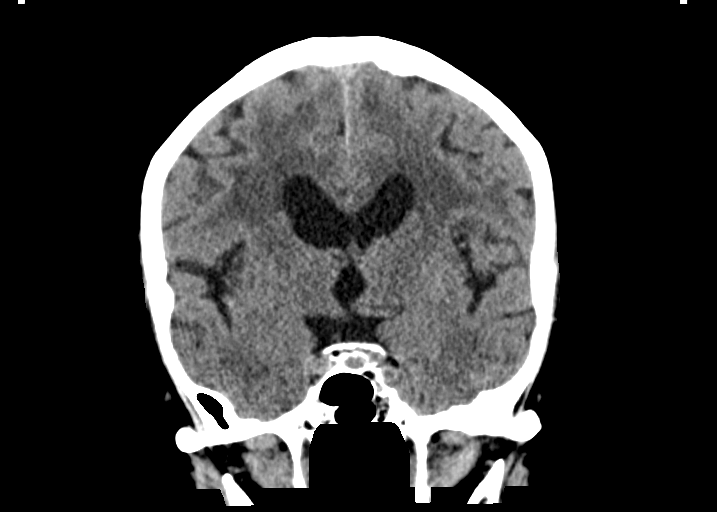

[Series 6: head 3.0 sag st · sagittal · 0.36mm/px · 3 of 61 slices shown]
[im 21/61  brain]
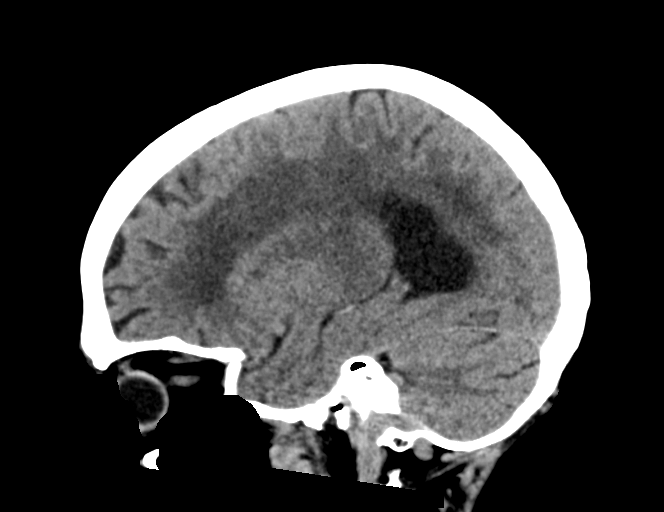
[im 31/61  brain]
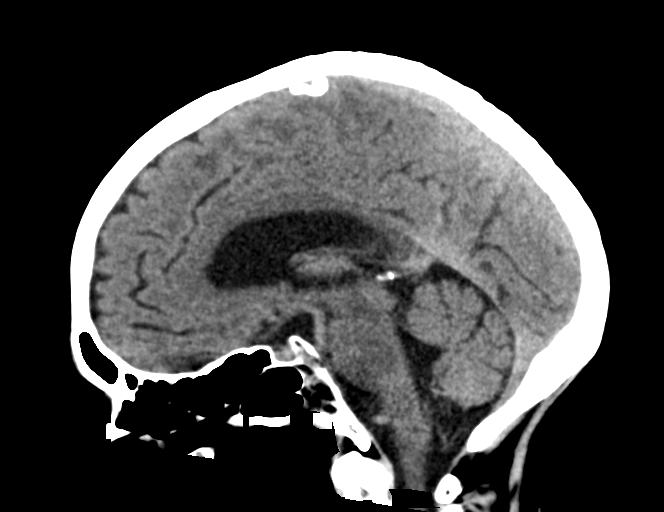
[im 41/61  brain]
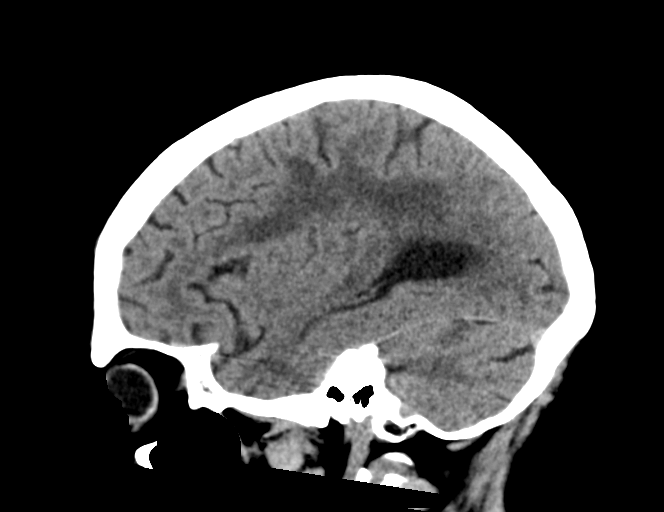

[15 of 47 positions shown; findings below may reference images not displayed]

FINDINGS: Brain: Mild to moderate atrophy. Moderate to advanced chronic
appearing white matter changes diffusely.

Negative for acute cortical infarct. Negative for hemorrhage or
mass. No midline shift. Negative for hydrocephalus

Vascular: Negative for hyperdense vessel

Skull: Negative

Sinuses/Orbits: Paranasal sinuses clear. Bilateral cataract removal.

Other: None

ASPECTS (Alberta Stroke Program Early CT Score)

- Ganglionic level infarction (caudate, lentiform nuclei, internal
capsule, insula, M1-M3 cortex): 7

- Supraganglionic infarction (M4-M6 cortex): 3

Total score (0-10 with 10 being normal): 10
IMPRESSION: 1. No acute intracranial abnormality
2. ASPECTS is 10
3. Atrophy and moderate to advanced chronic microvascular ischemia
in the white matter.

## 2019-08-06 DIAGNOSIS — H401111 Primary open-angle glaucoma, right eye, mild stage: Secondary | ICD-10-CM | POA: Diagnosis not present

## 2019-08-18 DIAGNOSIS — Z23 Encounter for immunization: Secondary | ICD-10-CM | POA: Diagnosis not present

## 2019-10-19 ENCOUNTER — Other Ambulatory Visit (HOSPITAL_BASED_OUTPATIENT_CLINIC_OR_DEPARTMENT_OTHER): Payer: Self-pay | Admitting: Nurse Practitioner

## 2019-10-19 DIAGNOSIS — M7989 Other specified soft tissue disorders: Secondary | ICD-10-CM | POA: Diagnosis not present

## 2019-10-19 DIAGNOSIS — R300 Dysuria: Secondary | ICD-10-CM | POA: Diagnosis not present

## 2019-10-20 ENCOUNTER — Other Ambulatory Visit: Payer: Self-pay

## 2019-10-20 ENCOUNTER — Ambulatory Visit (HOSPITAL_BASED_OUTPATIENT_CLINIC_OR_DEPARTMENT_OTHER)
Admission: RE | Admit: 2019-10-20 | Discharge: 2019-10-20 | Disposition: A | Discharge status: Home / Self Care | Payer: Medicare Other | Source: Ambulatory Visit | Attending: Nurse Practitioner | Admitting: Nurse Practitioner

## 2019-10-20 DIAGNOSIS — M7989 Other specified soft tissue disorders: Secondary | ICD-10-CM | POA: Diagnosis not present

## 2019-10-20 DIAGNOSIS — R600 Localized edema: Secondary | ICD-10-CM | POA: Diagnosis not present

## 2019-12-16 DIAGNOSIS — H401111 Primary open-angle glaucoma, right eye, mild stage: Secondary | ICD-10-CM | POA: Diagnosis not present

## 2019-12-16 DIAGNOSIS — H401123 Primary open-angle glaucoma, left eye, severe stage: Secondary | ICD-10-CM | POA: Diagnosis not present

## 2020-01-06 DIAGNOSIS — R300 Dysuria: Secondary | ICD-10-CM | POA: Diagnosis not present

## 2020-03-19 ENCOUNTER — Encounter (HOSPITAL_BASED_OUTPATIENT_CLINIC_OR_DEPARTMENT_OTHER): Payer: Self-pay | Admitting: Emergency Medicine

## 2020-03-19 ENCOUNTER — Emergency Department (HOSPITAL_BASED_OUTPATIENT_CLINIC_OR_DEPARTMENT_OTHER)
Admission: EM | Admit: 2020-03-19 | Discharge: 2020-03-19 | Disposition: A | Discharge status: Home / Self Care | Payer: Medicare Other | Attending: Emergency Medicine | Admitting: Emergency Medicine

## 2020-03-19 ENCOUNTER — Emergency Department (HOSPITAL_BASED_OUTPATIENT_CLINIC_OR_DEPARTMENT_OTHER): Payer: Medicare Other

## 2020-03-19 ENCOUNTER — Other Ambulatory Visit: Payer: Self-pay

## 2020-03-19 DIAGNOSIS — Z87891 Personal history of nicotine dependence: Secondary | ICD-10-CM | POA: Diagnosis not present

## 2020-03-19 DIAGNOSIS — W01198A Fall on same level from slipping, tripping and stumbling with subsequent striking against other object, initial encounter: Secondary | ICD-10-CM | POA: Diagnosis not present

## 2020-03-19 DIAGNOSIS — S6292XA Unspecified fracture of left wrist and hand, initial encounter for closed fracture: Secondary | ICD-10-CM | POA: Insufficient documentation

## 2020-03-19 DIAGNOSIS — Z7982 Long term (current) use of aspirin: Secondary | ICD-10-CM | POA: Diagnosis not present

## 2020-03-19 DIAGNOSIS — S62102A Fracture of unspecified carpal bone, left wrist, initial encounter for closed fracture: Secondary | ICD-10-CM

## 2020-03-19 DIAGNOSIS — Z79899 Other long term (current) drug therapy: Secondary | ICD-10-CM | POA: Insufficient documentation

## 2020-03-19 DIAGNOSIS — W5509XA Other contact with cat, initial encounter: Secondary | ICD-10-CM | POA: Diagnosis not present

## 2020-03-19 DIAGNOSIS — Y92009 Unspecified place in unspecified non-institutional (private) residence as the place of occurrence of the external cause: Secondary | ICD-10-CM | POA: Diagnosis not present

## 2020-03-19 DIAGNOSIS — S52502A Unspecified fracture of the lower end of left radius, initial encounter for closed fracture: Secondary | ICD-10-CM | POA: Diagnosis not present

## 2020-03-19 DIAGNOSIS — Y999 Unspecified external cause status: Secondary | ICD-10-CM | POA: Diagnosis not present

## 2020-03-19 DIAGNOSIS — S6992XA Unspecified injury of left wrist, hand and finger(s), initial encounter: Secondary | ICD-10-CM | POA: Diagnosis present

## 2020-03-19 DIAGNOSIS — S52602A Unspecified fracture of lower end of left ulna, initial encounter for closed fracture: Secondary | ICD-10-CM | POA: Diagnosis not present

## 2020-03-19 DIAGNOSIS — W010XXA Fall on same level from slipping, tripping and stumbling without subsequent striking against object, initial encounter: Secondary | ICD-10-CM

## 2020-03-19 DIAGNOSIS — Y9389 Activity, other specified: Secondary | ICD-10-CM | POA: Insufficient documentation

## 2020-03-19 HISTORY — DX: Cerebral infarction, unspecified: I63.9

## 2020-03-19 MED ORDER — NAPROXEN 250 MG PO TABS
500.0000 mg | ORAL_TABLET | Freq: Once | ORAL | Status: AC
  Administered 2020-03-19: 500 mg via ORAL
  Filled 2020-03-19: qty 2

## 2020-03-19 MED ORDER — HYDROCODONE-ACETAMINOPHEN 5-325 MG PO TABS
1.0000 | ORAL_TABLET | Freq: Once | ORAL | Status: AC
  Administered 2020-03-19: 1 via ORAL
  Filled 2020-03-19: qty 1

## 2020-03-19 MED ORDER — HYDROCODONE-ACETAMINOPHEN 5-325 MG PO TABS: 1.0000 | ORAL_TABLET | Freq: Four times a day (QID) | ORAL | 0 refills | Status: AC | PRN

## 2020-03-19 MED ORDER — ONDANSETRON 4 MG PO TBDP
8.0000 mg | ORAL_TABLET | Freq: Once | ORAL | Status: AC
  Administered 2020-03-19: 8 mg via ORAL
  Filled 2020-03-19: qty 2

## 2020-03-19 MED ORDER — ONDANSETRON 8 MG PO TBDP: 8.0000 mg | ORAL_TABLET | Freq: Three times a day (TID) | ORAL | 0 refills | Status: AC | PRN

## 2020-03-19 NOTE — ED Triage Notes (Signed)
Pt states she got up this morning and slipped in some water in the kitchen and fell  Pt injured her left wrist, deformity and bruising noted  Pt states incident happened around 330 this morning

## 2020-03-19 NOTE — ED Provider Notes (Signed)
[facility] Provider Note: Georgena Spurling, MD, FACEP  CSN: [id number] MRN: [id number] ARRIVAL: 03/19/20 at 0449 ROOM: [place] and Wrist Injury   HISTORY OF PRESENT ILLNESS  03/19/20 5:11 AM [name] is a 74 y.o. female who got up this morning about 3:30 AM and slipped on some water that was spilled from the cat's water bowl.  She fell onto her outstretched left hand.  She has pain, swelling and deformity of her left wrist which she rates as a 3 out of 10.  She describes it as aching and pain is worse with palpation or attempted movement.  Range of motion is decreased but the left hand has no numbness or functional deficit distally.  She denies other injury.   Past Medical History:  Diagnosis Date  . Chronic cholecystitis   . Complication of anesthesia   . Diverticulosis   . Endometrial cancer ([place]) 1977   s/p TAH  . History of transfusion 1970's  . PONV (postoperative nausea and vomiting)   . Stroke [facility])     Past Surgical History:  Procedure Laterality Date  . ABDOMINAL HYSTERECTOMY  1977   for endometrial cancer  . HIP ARTHROPLASTY Left 11/18/2016   Procedure: ARTHROPLASTY BIPOLAR HIP (HEMIARTHROPLASTY);  Surgeon: Nicholes Stairs, MD;  Location: [facility];  Service: Orthopedics;  Laterality: Left;  . LAPAROSCOPIC CHOLECYSTECTOMY SINGLE SITE WITH INTRAOPERATIVE CHOLANGIOGRAM N/A 07/19/2015   Procedure: LAPAROSCOPIC CHOLECYSTECTOMY SINGLE SITE WITH INTRAOPERATIVE CHOLANGIOGRAM;  Surgeon: Michael Boston, MD;  Location: [facility];  Service: General;  Laterality: N/A;  . TONSILLECTOMY    . TUBAL LIGATION      Family History  Problem Relation Age of Onset  . Obesity Daughter     Social History   Tobacco Use  . Smoking status: Former Smoker    Packs/day: 1.00    Years: 15.00    Pack years: 15.00    Quit date: 12/17/2017    Years since quitting: 2.2  . Smokeless tobacco: Never Used  . 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 Alcohol use: No  . Drug use: No    Prior to Admission medications   Medication Sig Start Date End Date Taking? Authorizing Provider  aspirin 325 MG tablet Take 1 tablet (325 mg total) by mouth daily. 12/18/17   Geradine Girt, DO  atorvastatin (LIPITOR) 80 MG tablet Take 1 tablet (80 mg total) by mouth daily at 6 PM. 12/26/17   Angiulli, Lavon Paganini, PA-C  brinzolamide (AZOPT) 1 % ophthalmic suspension Place 1 drop into both eyes 2 (two) times daily. 12/26/17   Angiulli, Lavon Paganini, PA-C  Cyanocobalamin (VITAMIN B-12 PO) Take 1 tablet by mouth daily.    [provider]  Latanoprostene Bunod (VYZULTA) 0.024 % SOLN Place 1 drop into both eyes at bedtime.    [provider]  Multiple Vitamin (MULTIVITAMIN WITH MINERALS) TABS tablet Take 1 tablet by mouth daily.    [provider]  senna-docusate (SENOKOT-S) 8.6-50 MG tablet Take 1 tablet by mouth at bedtime as needed for mild constipation. 12/17/17   Geradine Girt, DO    Allergies Penicillins   REVIEW OF SYSTEMS  Negative except as noted here or in the History of Present Illness.   PHYSICAL EXAMINATION  Initial Vital Signs Blood pressure (!) 164/68, pulse (!) 57, temperature 97.7 F (36.5 C), temperature source Oral, resp. rate 16, height 5\' 4"  (1.626 m), weight  65.8 kg, SpO2 99 %.  Examination General: Well-developed, well-nourished female in no acute distress; appearance consistent with age of record HENT: normocephalic; atraumatic Eyes: Normal appearance Neck: supple Heart: regular rate and rhythm Lungs: clear to auscultation bilaterally Abdomen: soft; nondistended; nontender; bowel sounds present Extremities: No deformity; deformity and tenderness left wrist with decreased range of motion, left hand distally neurovascularly intact with intact tendon function in the fingers:    Neurologic: Awake, alert and oriented; motor function intact in all extremities and symmetric; no facial  droop Skin: Warm and dry Psychiatric: Normal mood and affect   RESULTS  Summary of this visit's results, reviewed and interpreted by myself:   EKG Interpretation  Date/Time:    Ventricular Rate:    PR Interval:    QRS Duration:   QT Interval:    QTC Calculation:   R Axis:     Text Interpretation:        Laboratory Studies: No results found for this or any previous visit (from the past 24 hour(s)). Imaging Studies: DG Wrist Complete Left  Result Date: 03/19/2020 CLINICAL DATA:  74 year old female with history of trauma from a fall complaining of pain in the left wrist. EXAM: LEFT WRIST - COMPLETE 3+ VIEW COMPARISON:  No priors. FINDINGS: Four views of the left wrist demonstrate an acute mildly impacted dorsally angulated fracture of the distal radial metaphysis with approximately 40 degrees of dorsal angulation. There also appears to be an acute mildly impacted fracture of the distal ulna. Overlying soft tissues are markedly swollen. Carpals appear intact. Mild diffuse osteopenia. IMPRESSION: 1. Acute fractures of the distal radius and ulna, as above. Electronically Signed   By: Vinnie Langton M.D.   On: 03/19/2020 05:55    ED COURSE and MDM  Nursing notes, initial and subsequent vitals signs, including pulse oximetry, reviewed and interpreted by myself.  Vitals:   03/19/20 0456 03/19/20 0500 03/19/20 0605  BP:  (!) 164/68 (!) 156/58  Pulse:  (!) 57 (!) 54  Resp:  16 20  Temp:  97.7 F (36.5 C)   TempSrc:  Oral   SpO2:  99% 97%  Weight: 65.8 kg    Height: 5\' 4"  (1.626 m)     Medications  naproxen (NAPROSYN) tablet 500 mg (500 mg Oral Given 03/19/20 0536)  HYDROcodone-acetaminophen (NORCO/VICODIN) 5-325 MG per tablet 1 tablet (1 tablet Oral Given 03/19/20 0536)  ondansetron (ZOFRAN-ODT) disintegrating tablet 8 mg (8 mg Oral Given 03/19/20 0536)   6:16 AM Splint applied by tech.  Left hand remains neurovascularly intact.  Patient is requesting follow-up with [facility]  where she is an established patient.   PROCEDURES  Procedures   ED DIAGNOSES     ICD-10-CM   1. Closed fracture of left wrist, initial encounter  S62.102A   2. Fall on same level from slipping, initial encounter  W01.0XXA   3. Other contact with cat, initial encounter  W55.09XA        Kenetha Cozza, Jenny Reichmann, MD 03/19/20 [phone]

## 2020-03-23 DIAGNOSIS — R300 Dysuria: Secondary | ICD-10-CM | POA: Diagnosis not present

## 2020-03-23 DIAGNOSIS — S52532A Colles' fracture of left radius, initial encounter for closed fracture: Secondary | ICD-10-CM | POA: Diagnosis not present

## 2020-03-23 DIAGNOSIS — M25532 Pain in left wrist: Secondary | ICD-10-CM | POA: Diagnosis not present

## 2020-03-28 DIAGNOSIS — S52572A Other intraarticular fracture of lower end of left radius, initial encounter for closed fracture: Secondary | ICD-10-CM | POA: Diagnosis not present

## 2020-03-28 DIAGNOSIS — S52532A Colles' fracture of left radius, initial encounter for closed fracture: Secondary | ICD-10-CM | POA: Diagnosis not present

## 2020-03-28 DIAGNOSIS — X58XXXA Exposure to other specified factors, initial encounter: Secondary | ICD-10-CM | POA: Diagnosis not present

## 2020-03-28 DIAGNOSIS — W19XXXA Unspecified fall, initial encounter: Secondary | ICD-10-CM | POA: Diagnosis not present

## 2020-04-08 DIAGNOSIS — Z4789 Encounter for other orthopedic aftercare: Secondary | ICD-10-CM | POA: Diagnosis not present

## 2020-04-08 DIAGNOSIS — S52532A Colles' fracture of left radius, initial encounter for closed fracture: Secondary | ICD-10-CM | POA: Diagnosis not present

## 2020-04-15 DIAGNOSIS — M25632 Stiffness of left wrist, not elsewhere classified: Secondary | ICD-10-CM | POA: Diagnosis not present

## 2020-04-21 DIAGNOSIS — M79642 Pain in left hand: Secondary | ICD-10-CM | POA: Diagnosis not present

## 2020-04-22 DIAGNOSIS — M79642 Pain in left hand: Secondary | ICD-10-CM | POA: Diagnosis not present

## 2020-04-27 DIAGNOSIS — M79642 Pain in left hand: Secondary | ICD-10-CM | POA: Diagnosis not present

## 2020-04-29 DIAGNOSIS — M79642 Pain in left hand: Secondary | ICD-10-CM | POA: Diagnosis not present

## 2020-05-03 DIAGNOSIS — M79642 Pain in left hand: Secondary | ICD-10-CM | POA: Diagnosis not present

## 2020-05-06 DIAGNOSIS — M25632 Stiffness of left wrist, not elsewhere classified: Secondary | ICD-10-CM | POA: Diagnosis not present

## 2020-05-06 DIAGNOSIS — S52532A Colles' fracture of left radius, initial encounter for closed fracture: Secondary | ICD-10-CM | POA: Diagnosis not present

## 2020-05-06 DIAGNOSIS — Z4789 Encounter for other orthopedic aftercare: Secondary | ICD-10-CM | POA: Diagnosis not present

## 2020-06-07 DIAGNOSIS — M79642 Pain in left hand: Secondary | ICD-10-CM | POA: Diagnosis not present

## 2020-06-10 DIAGNOSIS — M79642 Pain in left hand: Secondary | ICD-10-CM | POA: Diagnosis not present

## 2020-06-14 DIAGNOSIS — M79642 Pain in left hand: Secondary | ICD-10-CM | POA: Diagnosis not present

## 2020-06-17 DIAGNOSIS — S52532A Colles' fracture of left radius, initial encounter for closed fracture: Secondary | ICD-10-CM | POA: Diagnosis not present

## 2020-06-17 DIAGNOSIS — M25632 Stiffness of left wrist, not elsewhere classified: Secondary | ICD-10-CM | POA: Diagnosis not present

## 2020-06-17 DIAGNOSIS — Z4789 Encounter for other orthopedic aftercare: Secondary | ICD-10-CM | POA: Diagnosis not present

## 2020-07-07 DIAGNOSIS — M25632 Stiffness of left wrist, not elsewhere classified: Secondary | ICD-10-CM | POA: Diagnosis not present

## 2020-07-12 DIAGNOSIS — R05 Cough: Secondary | ICD-10-CM | POA: Diagnosis not present

## 2020-07-12 DIAGNOSIS — U071 COVID-19: Secondary | ICD-10-CM | POA: Diagnosis not present

## 2020-07-14 DIAGNOSIS — R300 Dysuria: Secondary | ICD-10-CM | POA: Diagnosis not present

## 2020-07-29 DIAGNOSIS — S52532A Colles' fracture of left radius, initial encounter for closed fracture: Secondary | ICD-10-CM | POA: Diagnosis not present

## 2020-09-02 DIAGNOSIS — Z23 Encounter for immunization: Secondary | ICD-10-CM | POA: Diagnosis not present

## 2020-10-27 DIAGNOSIS — Z23 Encounter for immunization: Secondary | ICD-10-CM | POA: Diagnosis not present

## 2020-12-06 DIAGNOSIS — R300 Dysuria: Secondary | ICD-10-CM | POA: Diagnosis not present

## 2020-12-15 DIAGNOSIS — H401112 Primary open-angle glaucoma, right eye, moderate stage: Secondary | ICD-10-CM | POA: Diagnosis not present

## 2020-12-20 DIAGNOSIS — R0989 Other specified symptoms and signs involving the circulatory and respiratory systems: Secondary | ICD-10-CM | POA: Diagnosis not present

## 2020-12-20 DIAGNOSIS — H409 Unspecified glaucoma: Secondary | ICD-10-CM | POA: Diagnosis not present

## 2020-12-20 DIAGNOSIS — E782 Mixed hyperlipidemia: Secondary | ICD-10-CM | POA: Diagnosis not present

## 2020-12-20 DIAGNOSIS — E559 Vitamin D deficiency, unspecified: Secondary | ICD-10-CM | POA: Diagnosis not present

## 2020-12-20 DIAGNOSIS — Z0000 Encounter for general adult medical examination without abnormal findings: Secondary | ICD-10-CM | POA: Diagnosis not present

## 2020-12-20 DIAGNOSIS — D126 Benign neoplasm of colon, unspecified: Secondary | ICD-10-CM | POA: Diagnosis not present

## 2020-12-26 DIAGNOSIS — R300 Dysuria: Secondary | ICD-10-CM | POA: Diagnosis not present

## 2020-12-28 DIAGNOSIS — I6523 Occlusion and stenosis of bilateral carotid arteries: Secondary | ICD-10-CM | POA: Diagnosis not present

## 2020-12-28 DIAGNOSIS — R0989 Other specified symptoms and signs involving the circulatory and respiratory systems: Secondary | ICD-10-CM | POA: Diagnosis not present

## 2021-01-06 DIAGNOSIS — H401230 Low-tension glaucoma, bilateral, stage unspecified: Secondary | ICD-10-CM | POA: Diagnosis not present

## 2021-01-23 DIAGNOSIS — R300 Dysuria: Secondary | ICD-10-CM | POA: Diagnosis not present

## 2021-02-15 DIAGNOSIS — Z86010 Personal history of colonic polyps: Secondary | ICD-10-CM | POA: Diagnosis not present

## 2021-06-08 DIAGNOSIS — R300 Dysuria: Secondary | ICD-10-CM | POA: Diagnosis not present

## 2021-06-08 DIAGNOSIS — I10 Essential (primary) hypertension: Secondary | ICD-10-CM | POA: Diagnosis not present

## 2021-06-26 DIAGNOSIS — Z20822 Contact with and (suspected) exposure to covid-19: Secondary | ICD-10-CM | POA: Diagnosis not present

## 2021-07-07 DIAGNOSIS — H401112 Primary open-angle glaucoma, right eye, moderate stage: Secondary | ICD-10-CM | POA: Diagnosis not present

## 2021-07-27 DIAGNOSIS — Z20822 Contact with and (suspected) exposure to covid-19: Secondary | ICD-10-CM | POA: Diagnosis not present

## 2021-08-18 DIAGNOSIS — R300 Dysuria: Secondary | ICD-10-CM | POA: Diagnosis not present

## 2021-08-18 DIAGNOSIS — R600 Localized edema: Secondary | ICD-10-CM | POA: Diagnosis not present

## 2021-08-18 DIAGNOSIS — R208 Other disturbances of skin sensation: Secondary | ICD-10-CM | POA: Diagnosis not present

## 2021-09-07 DIAGNOSIS — R300 Dysuria: Secondary | ICD-10-CM | POA: Diagnosis not present

## 2021-11-07 DIAGNOSIS — Z23 Encounter for immunization: Secondary | ICD-10-CM | POA: Diagnosis not present

## 2021-11-11 DIAGNOSIS — Z20828 Contact with and (suspected) exposure to other viral communicable diseases: Secondary | ICD-10-CM | POA: Diagnosis not present

## 2021-11-14 DIAGNOSIS — R300 Dysuria: Secondary | ICD-10-CM | POA: Diagnosis not present

## 2021-12-25 DIAGNOSIS — N3000 Acute cystitis without hematuria: Secondary | ICD-10-CM | POA: Diagnosis not present

## 2022-01-03 DIAGNOSIS — R590 Localized enlarged lymph nodes: Secondary | ICD-10-CM | POA: Diagnosis not present

## 2022-01-03 DIAGNOSIS — D3501 Benign neoplasm of right adrenal gland: Secondary | ICD-10-CM | POA: Diagnosis not present

## 2022-01-03 DIAGNOSIS — R109 Unspecified abdominal pain: Secondary | ICD-10-CM | POA: Diagnosis not present

## 2022-01-03 DIAGNOSIS — D3502 Benign neoplasm of left adrenal gland: Secondary | ICD-10-CM | POA: Diagnosis not present

## 2022-01-03 DIAGNOSIS — N3000 Acute cystitis without hematuria: Secondary | ICD-10-CM | POA: Diagnosis not present

## 2022-01-04 DIAGNOSIS — M25532 Pain in left wrist: Secondary | ICD-10-CM | POA: Diagnosis not present

## 2022-01-18 DIAGNOSIS — R590 Localized enlarged lymph nodes: Secondary | ICD-10-CM | POA: Diagnosis not present

## 2022-02-13 DIAGNOSIS — Z85820 Personal history of malignant melanoma of skin: Secondary | ICD-10-CM | POA: Diagnosis not present

## 2022-02-13 DIAGNOSIS — R590 Localized enlarged lymph nodes: Secondary | ICD-10-CM | POA: Diagnosis not present

## 2022-02-14 DIAGNOSIS — C774 Secondary and unspecified malignant neoplasm of inguinal and lower limb lymph nodes: Secondary | ICD-10-CM | POA: Diagnosis not present

## 2022-02-15 DIAGNOSIS — C774 Secondary and unspecified malignant neoplasm of inguinal and lower limb lymph nodes: Secondary | ICD-10-CM | POA: Diagnosis not present

## 2022-02-22 DIAGNOSIS — C779 Secondary and unspecified malignant neoplasm of lymph node, unspecified: Secondary | ICD-10-CM | POA: Diagnosis not present

## 2022-02-22 DIAGNOSIS — C801 Malignant (primary) neoplasm, unspecified: Secondary | ICD-10-CM | POA: Diagnosis not present

## 2022-02-22 DIAGNOSIS — R590 Localized enlarged lymph nodes: Secondary | ICD-10-CM | POA: Diagnosis not present

## 2022-02-26 DIAGNOSIS — C779 Secondary and unspecified malignant neoplasm of lymph node, unspecified: Secondary | ICD-10-CM | POA: Diagnosis not present

## 2022-02-26 DIAGNOSIS — Z8541 Personal history of malignant neoplasm of cervix uteri: Secondary | ICD-10-CM | POA: Diagnosis not present

## 2022-02-26 DIAGNOSIS — R590 Localized enlarged lymph nodes: Secondary | ICD-10-CM | POA: Diagnosis not present

## 2022-02-27 DIAGNOSIS — R590 Localized enlarged lymph nodes: Secondary | ICD-10-CM | POA: Diagnosis not present

## 2022-02-27 DIAGNOSIS — C801 Malignant (primary) neoplasm, unspecified: Secondary | ICD-10-CM | POA: Diagnosis not present

## 2022-02-27 DIAGNOSIS — C779 Secondary and unspecified malignant neoplasm of lymph node, unspecified: Secondary | ICD-10-CM | POA: Diagnosis not present

## 2022-03-06 DIAGNOSIS — R590 Localized enlarged lymph nodes: Secondary | ICD-10-CM | POA: Diagnosis not present

## 2022-03-06 DIAGNOSIS — C44520 Squamous cell carcinoma of anal skin: Secondary | ICD-10-CM | POA: Diagnosis not present

## 2022-03-06 DIAGNOSIS — E278 Other specified disorders of adrenal gland: Secondary | ICD-10-CM | POA: Diagnosis not present

## 2022-03-06 DIAGNOSIS — C801 Malignant (primary) neoplasm, unspecified: Secondary | ICD-10-CM | POA: Diagnosis not present

## 2022-03-06 DIAGNOSIS — J439 Emphysema, unspecified: Secondary | ICD-10-CM | POA: Diagnosis not present

## 2022-03-06 DIAGNOSIS — C779 Secondary and unspecified malignant neoplasm of lymph node, unspecified: Secondary | ICD-10-CM | POA: Diagnosis not present

## 2022-03-06 DIAGNOSIS — K7689 Other specified diseases of liver: Secondary | ICD-10-CM | POA: Diagnosis not present

## 2022-03-09 DIAGNOSIS — Z8541 Personal history of malignant neoplasm of cervix uteri: Secondary | ICD-10-CM | POA: Diagnosis not present

## 2022-03-09 DIAGNOSIS — C210 Malignant neoplasm of anus, unspecified: Secondary | ICD-10-CM | POA: Diagnosis not present

## 2022-03-09 DIAGNOSIS — C539 Malignant neoplasm of cervix uteri, unspecified: Secondary | ICD-10-CM | POA: Diagnosis not present

## 2022-03-12 DIAGNOSIS — C210 Malignant neoplasm of anus, unspecified: Secondary | ICD-10-CM | POA: Diagnosis not present

## 2022-03-12 DIAGNOSIS — Z510 Encounter for antineoplastic radiation therapy: Secondary | ICD-10-CM | POA: Diagnosis not present

## 2022-03-13 DIAGNOSIS — C210 Malignant neoplasm of anus, unspecified: Secondary | ICD-10-CM | POA: Diagnosis not present

## 2022-03-14 DIAGNOSIS — C210 Malignant neoplasm of anus, unspecified: Secondary | ICD-10-CM | POA: Diagnosis not present

## 2022-03-14 DIAGNOSIS — R590 Localized enlarged lymph nodes: Secondary | ICD-10-CM | POA: Diagnosis not present

## 2022-03-15 DIAGNOSIS — Z510 Encounter for antineoplastic radiation therapy: Secondary | ICD-10-CM | POA: Diagnosis not present

## 2022-03-15 DIAGNOSIS — C210 Malignant neoplasm of anus, unspecified: Secondary | ICD-10-CM | POA: Diagnosis not present

## 2022-03-15 DIAGNOSIS — K5730 Diverticulosis of large intestine without perforation or abscess without bleeding: Secondary | ICD-10-CM | POA: Diagnosis not present

## 2022-03-15 DIAGNOSIS — Z1211 Encounter for screening for malignant neoplasm of colon: Secondary | ICD-10-CM | POA: Diagnosis not present

## 2022-03-16 DIAGNOSIS — C210 Malignant neoplasm of anus, unspecified: Secondary | ICD-10-CM | POA: Diagnosis not present

## 2022-03-19 DIAGNOSIS — Z452 Encounter for adjustment and management of vascular access device: Secondary | ICD-10-CM | POA: Diagnosis not present

## 2022-03-19 DIAGNOSIS — C210 Malignant neoplasm of anus, unspecified: Secondary | ICD-10-CM | POA: Diagnosis not present

## 2022-03-19 DIAGNOSIS — Z20822 Contact with and (suspected) exposure to covid-19: Secondary | ICD-10-CM | POA: Diagnosis not present

## 2022-03-26 DIAGNOSIS — Z5111 Encounter for antineoplastic chemotherapy: Secondary | ICD-10-CM | POA: Diagnosis not present

## 2022-03-26 DIAGNOSIS — C210 Malignant neoplasm of anus, unspecified: Secondary | ICD-10-CM | POA: Diagnosis not present

## 2022-03-26 DIAGNOSIS — Z510 Encounter for antineoplastic radiation therapy: Secondary | ICD-10-CM | POA: Diagnosis not present

## 2022-03-27 DIAGNOSIS — C210 Malignant neoplasm of anus, unspecified: Secondary | ICD-10-CM | POA: Diagnosis not present

## 2022-03-27 DIAGNOSIS — Z510 Encounter for antineoplastic radiation therapy: Secondary | ICD-10-CM | POA: Diagnosis not present

## 2022-03-28 DIAGNOSIS — Z510 Encounter for antineoplastic radiation therapy: Secondary | ICD-10-CM | POA: Diagnosis not present

## 2022-03-28 DIAGNOSIS — C210 Malignant neoplasm of anus, unspecified: Secondary | ICD-10-CM | POA: Diagnosis not present

## 2022-03-29 DIAGNOSIS — Z510 Encounter for antineoplastic radiation therapy: Secondary | ICD-10-CM | POA: Diagnosis not present

## 2022-03-29 DIAGNOSIS — C210 Malignant neoplasm of anus, unspecified: Secondary | ICD-10-CM | POA: Diagnosis not present

## 2022-03-30 DIAGNOSIS — C210 Malignant neoplasm of anus, unspecified: Secondary | ICD-10-CM | POA: Diagnosis not present

## 2022-03-30 DIAGNOSIS — C541 Malignant neoplasm of endometrium: Secondary | ICD-10-CM | POA: Diagnosis not present

## 2022-03-30 DIAGNOSIS — Z510 Encounter for antineoplastic radiation therapy: Secondary | ICD-10-CM | POA: Diagnosis not present

## 2022-04-02 DIAGNOSIS — Z510 Encounter for antineoplastic radiation therapy: Secondary | ICD-10-CM | POA: Diagnosis not present

## 2022-04-02 DIAGNOSIS — C210 Malignant neoplasm of anus, unspecified: Secondary | ICD-10-CM | POA: Diagnosis not present

## 2022-04-03 DIAGNOSIS — C210 Malignant neoplasm of anus, unspecified: Secondary | ICD-10-CM | POA: Diagnosis not present

## 2022-04-03 DIAGNOSIS — Z510 Encounter for antineoplastic radiation therapy: Secondary | ICD-10-CM | POA: Diagnosis not present

## 2022-04-04 DIAGNOSIS — C210 Malignant neoplasm of anus, unspecified: Secondary | ICD-10-CM | POA: Diagnosis not present

## 2022-04-04 DIAGNOSIS — Z510 Encounter for antineoplastic radiation therapy: Secondary | ICD-10-CM | POA: Diagnosis not present

## 2022-04-05 DIAGNOSIS — Z510 Encounter for antineoplastic radiation therapy: Secondary | ICD-10-CM | POA: Diagnosis not present

## 2022-04-05 DIAGNOSIS — C210 Malignant neoplasm of anus, unspecified: Secondary | ICD-10-CM | POA: Diagnosis not present

## 2022-04-06 DIAGNOSIS — Z510 Encounter for antineoplastic radiation therapy: Secondary | ICD-10-CM | POA: Diagnosis not present

## 2022-04-06 DIAGNOSIS — C210 Malignant neoplasm of anus, unspecified: Secondary | ICD-10-CM | POA: Diagnosis not present

## 2022-04-09 DIAGNOSIS — C210 Malignant neoplasm of anus, unspecified: Secondary | ICD-10-CM | POA: Diagnosis not present

## 2022-04-09 DIAGNOSIS — Z510 Encounter for antineoplastic radiation therapy: Secondary | ICD-10-CM | POA: Diagnosis not present

## 2022-04-10 DIAGNOSIS — Z510 Encounter for antineoplastic radiation therapy: Secondary | ICD-10-CM | POA: Diagnosis not present

## 2022-04-10 DIAGNOSIS — C210 Malignant neoplasm of anus, unspecified: Secondary | ICD-10-CM | POA: Diagnosis not present

## 2022-04-11 DIAGNOSIS — Z510 Encounter for antineoplastic radiation therapy: Secondary | ICD-10-CM | POA: Diagnosis not present

## 2022-04-11 DIAGNOSIS — C210 Malignant neoplasm of anus, unspecified: Secondary | ICD-10-CM | POA: Diagnosis not present

## 2022-04-12 DIAGNOSIS — C210 Malignant neoplasm of anus, unspecified: Secondary | ICD-10-CM | POA: Diagnosis not present

## 2022-04-12 DIAGNOSIS — Z510 Encounter for antineoplastic radiation therapy: Secondary | ICD-10-CM | POA: Diagnosis not present

## 2022-04-13 DIAGNOSIS — Z510 Encounter for antineoplastic radiation therapy: Secondary | ICD-10-CM | POA: Diagnosis not present

## 2022-04-13 DIAGNOSIS — C210 Malignant neoplasm of anus, unspecified: Secondary | ICD-10-CM | POA: Diagnosis not present

## 2022-04-16 DIAGNOSIS — Z510 Encounter for antineoplastic radiation therapy: Secondary | ICD-10-CM | POA: Diagnosis not present

## 2022-04-16 DIAGNOSIS — C210 Malignant neoplasm of anus, unspecified: Secondary | ICD-10-CM | POA: Diagnosis not present

## 2022-04-17 DIAGNOSIS — Z510 Encounter for antineoplastic radiation therapy: Secondary | ICD-10-CM | POA: Diagnosis not present

## 2022-04-17 DIAGNOSIS — C210 Malignant neoplasm of anus, unspecified: Secondary | ICD-10-CM | POA: Diagnosis not present

## 2022-04-18 DIAGNOSIS — C210 Malignant neoplasm of anus, unspecified: Secondary | ICD-10-CM | POA: Diagnosis not present

## 2022-04-18 DIAGNOSIS — Z510 Encounter for antineoplastic radiation therapy: Secondary | ICD-10-CM | POA: Diagnosis not present

## 2022-04-19 DIAGNOSIS — Z510 Encounter for antineoplastic radiation therapy: Secondary | ICD-10-CM | POA: Diagnosis not present

## 2022-04-19 DIAGNOSIS — C210 Malignant neoplasm of anus, unspecified: Secondary | ICD-10-CM | POA: Diagnosis not present

## 2022-04-20 DIAGNOSIS — C210 Malignant neoplasm of anus, unspecified: Secondary | ICD-10-CM | POA: Diagnosis not present

## 2022-04-20 DIAGNOSIS — Z510 Encounter for antineoplastic radiation therapy: Secondary | ICD-10-CM | POA: Diagnosis not present

## 2022-04-24 DIAGNOSIS — Z510 Encounter for antineoplastic radiation therapy: Secondary | ICD-10-CM | POA: Diagnosis not present

## 2022-04-24 DIAGNOSIS — Z5111 Encounter for antineoplastic chemotherapy: Secondary | ICD-10-CM | POA: Diagnosis not present

## 2022-04-24 DIAGNOSIS — C210 Malignant neoplasm of anus, unspecified: Secondary | ICD-10-CM | POA: Diagnosis not present

## 2022-04-25 DIAGNOSIS — Z510 Encounter for antineoplastic radiation therapy: Secondary | ICD-10-CM | POA: Diagnosis not present

## 2022-04-25 DIAGNOSIS — C210 Malignant neoplasm of anus, unspecified: Secondary | ICD-10-CM | POA: Diagnosis not present

## 2022-04-26 DIAGNOSIS — C210 Malignant neoplasm of anus, unspecified: Secondary | ICD-10-CM | POA: Diagnosis not present

## 2022-04-26 DIAGNOSIS — Z510 Encounter for antineoplastic radiation therapy: Secondary | ICD-10-CM | POA: Diagnosis not present

## 2022-04-27 DIAGNOSIS — C210 Malignant neoplasm of anus, unspecified: Secondary | ICD-10-CM | POA: Diagnosis not present

## 2022-04-27 DIAGNOSIS — Z510 Encounter for antineoplastic radiation therapy: Secondary | ICD-10-CM | POA: Diagnosis not present

## 2022-04-27 DIAGNOSIS — C541 Malignant neoplasm of endometrium: Secondary | ICD-10-CM | POA: Diagnosis not present

## 2022-04-30 DIAGNOSIS — Z510 Encounter for antineoplastic radiation therapy: Secondary | ICD-10-CM | POA: Diagnosis not present

## 2022-04-30 DIAGNOSIS — C210 Malignant neoplasm of anus, unspecified: Secondary | ICD-10-CM | POA: Diagnosis not present

## 2022-05-01 DIAGNOSIS — Z510 Encounter for antineoplastic radiation therapy: Secondary | ICD-10-CM | POA: Diagnosis not present

## 2022-05-01 DIAGNOSIS — C210 Malignant neoplasm of anus, unspecified: Secondary | ICD-10-CM | POA: Diagnosis not present

## 2022-05-02 DIAGNOSIS — Z510 Encounter for antineoplastic radiation therapy: Secondary | ICD-10-CM | POA: Diagnosis not present

## 2022-05-02 DIAGNOSIS — C210 Malignant neoplasm of anus, unspecified: Secondary | ICD-10-CM | POA: Diagnosis not present

## 2022-05-03 DIAGNOSIS — C210 Malignant neoplasm of anus, unspecified: Secondary | ICD-10-CM | POA: Diagnosis not present

## 2022-05-03 DIAGNOSIS — Z510 Encounter for antineoplastic radiation therapy: Secondary | ICD-10-CM | POA: Diagnosis not present

## 2022-05-04 DIAGNOSIS — C210 Malignant neoplasm of anus, unspecified: Secondary | ICD-10-CM | POA: Diagnosis not present

## 2022-05-04 DIAGNOSIS — Z510 Encounter for antineoplastic radiation therapy: Secondary | ICD-10-CM | POA: Diagnosis not present

## 2022-05-05 DIAGNOSIS — I517 Cardiomegaly: Secondary | ICD-10-CM | POA: Diagnosis not present

## 2022-05-05 DIAGNOSIS — R531 Weakness: Secondary | ICD-10-CM | POA: Diagnosis not present

## 2022-05-05 DIAGNOSIS — J90 Pleural effusion, not elsewhere classified: Secondary | ICD-10-CM | POA: Diagnosis not present

## 2022-05-05 DIAGNOSIS — E860 Dehydration: Secondary | ICD-10-CM | POA: Diagnosis not present

## 2022-05-05 DIAGNOSIS — R9082 White matter disease, unspecified: Secondary | ICD-10-CM | POA: Diagnosis not present

## 2022-05-05 DIAGNOSIS — Z8541 Personal history of malignant neoplasm of cervix uteri: Secondary | ICD-10-CM | POA: Diagnosis not present

## 2022-05-05 DIAGNOSIS — J439 Emphysema, unspecified: Secondary | ICD-10-CM | POA: Diagnosis not present

## 2022-05-05 DIAGNOSIS — N3001 Acute cystitis with hematuria: Secondary | ICD-10-CM | POA: Diagnosis not present

## 2022-05-05 DIAGNOSIS — R7989 Other specified abnormal findings of blood chemistry: Secondary | ICD-10-CM | POA: Diagnosis not present

## 2022-05-05 DIAGNOSIS — Z20822 Contact with and (suspected) exposure to covid-19: Secondary | ICD-10-CM | POA: Diagnosis not present

## 2022-05-05 DIAGNOSIS — R400 Somnolence: Secondary | ICD-10-CM | POA: Diagnosis not present

## 2022-05-05 DIAGNOSIS — Z87891 Personal history of nicotine dependence: Secondary | ICD-10-CM | POA: Diagnosis not present

## 2022-05-05 DIAGNOSIS — D649 Anemia, unspecified: Secondary | ICD-10-CM | POA: Diagnosis not present

## 2022-05-05 DIAGNOSIS — R9431 Abnormal electrocardiogram [ECG] [EKG]: Secondary | ICD-10-CM | POA: Diagnosis not present

## 2022-05-05 DIAGNOSIS — I6782 Cerebral ischemia: Secondary | ICD-10-CM | POA: Diagnosis not present

## 2022-05-05 DIAGNOSIS — Z880 Allergy status to penicillin: Secondary | ICD-10-CM | POA: Diagnosis not present

## 2022-05-05 DIAGNOSIS — D701 Agranulocytosis secondary to cancer chemotherapy: Secondary | ICD-10-CM | POA: Diagnosis not present

## 2022-05-05 DIAGNOSIS — R4182 Altered mental status, unspecified: Secondary | ICD-10-CM | POA: Diagnosis not present

## 2022-05-05 DIAGNOSIS — I4891 Unspecified atrial fibrillation: Secondary | ICD-10-CM | POA: Diagnosis not present

## 2022-05-05 DIAGNOSIS — J849 Interstitial pulmonary disease, unspecified: Secondary | ICD-10-CM | POA: Diagnosis not present

## 2022-05-05 DIAGNOSIS — R0989 Other specified symptoms and signs involving the circulatory and respiratory systems: Secondary | ICD-10-CM | POA: Diagnosis not present

## 2022-05-05 DIAGNOSIS — I959 Hypotension, unspecified: Secondary | ICD-10-CM | POA: Diagnosis not present

## 2022-05-05 DIAGNOSIS — C210 Malignant neoplasm of anus, unspecified: Secondary | ICD-10-CM | POA: Diagnosis not present

## 2022-05-06 DIAGNOSIS — R0989 Other specified symptoms and signs involving the circulatory and respiratory systems: Secondary | ICD-10-CM | POA: Diagnosis not present

## 2022-05-06 DIAGNOSIS — R7989 Other specified abnormal findings of blood chemistry: Secondary | ICD-10-CM | POA: Diagnosis not present

## 2022-05-06 DIAGNOSIS — R400 Somnolence: Secondary | ICD-10-CM | POA: Diagnosis not present

## 2022-05-06 DIAGNOSIS — R9082 White matter disease, unspecified: Secondary | ICD-10-CM | POA: Diagnosis not present

## 2022-05-06 DIAGNOSIS — R4182 Altered mental status, unspecified: Secondary | ICD-10-CM | POA: Diagnosis not present

## 2022-05-06 DIAGNOSIS — J90 Pleural effusion, not elsewhere classified: Secondary | ICD-10-CM | POA: Diagnosis not present

## 2022-05-06 DIAGNOSIS — J439 Emphysema, unspecified: Secondary | ICD-10-CM | POA: Diagnosis not present

## 2022-05-06 DIAGNOSIS — N3001 Acute cystitis with hematuria: Secondary | ICD-10-CM | POA: Diagnosis not present

## 2022-05-06 DIAGNOSIS — I6782 Cerebral ischemia: Secondary | ICD-10-CM | POA: Diagnosis not present

## 2022-05-06 DIAGNOSIS — D649 Anemia, unspecified: Secondary | ICD-10-CM | POA: Diagnosis not present

## 2022-05-07 DIAGNOSIS — Z510 Encounter for antineoplastic radiation therapy: Secondary | ICD-10-CM | POA: Diagnosis not present

## 2022-05-07 DIAGNOSIS — C210 Malignant neoplasm of anus, unspecified: Secondary | ICD-10-CM | POA: Diagnosis not present

## 2022-05-14 DIAGNOSIS — C210 Malignant neoplasm of anus, unspecified: Secondary | ICD-10-CM | POA: Diagnosis not present

## 2022-05-14 DIAGNOSIS — E876 Hypokalemia: Secondary | ICD-10-CM | POA: Diagnosis not present

## 2022-05-21 DIAGNOSIS — E876 Hypokalemia: Secondary | ICD-10-CM | POA: Diagnosis not present

## 2022-06-15 DIAGNOSIS — I10 Essential (primary) hypertension: Secondary | ICD-10-CM | POA: Diagnosis not present

## 2022-06-15 DIAGNOSIS — D6489 Other specified anemias: Secondary | ICD-10-CM | POA: Diagnosis not present

## 2022-06-15 DIAGNOSIS — C210 Malignant neoplasm of anus, unspecified: Secondary | ICD-10-CM | POA: Diagnosis not present

## 2022-06-15 DIAGNOSIS — Z0000 Encounter for general adult medical examination without abnormal findings: Secondary | ICD-10-CM | POA: Diagnosis not present

## 2022-06-18 DIAGNOSIS — D3501 Benign neoplasm of right adrenal gland: Secondary | ICD-10-CM | POA: Diagnosis not present

## 2022-06-18 DIAGNOSIS — C210 Malignant neoplasm of anus, unspecified: Secondary | ICD-10-CM | POA: Diagnosis not present

## 2022-06-18 DIAGNOSIS — D3502 Benign neoplasm of left adrenal gland: Secondary | ICD-10-CM | POA: Diagnosis not present

## 2022-06-18 DIAGNOSIS — Z96642 Presence of left artificial hip joint: Secondary | ICD-10-CM | POA: Diagnosis not present

## 2022-06-18 DIAGNOSIS — R918 Other nonspecific abnormal finding of lung field: Secondary | ICD-10-CM | POA: Diagnosis not present

## 2022-06-25 DIAGNOSIS — D62 Acute posthemorrhagic anemia: Secondary | ICD-10-CM | POA: Diagnosis not present

## 2022-06-25 DIAGNOSIS — C210 Malignant neoplasm of anus, unspecified: Secondary | ICD-10-CM | POA: Diagnosis not present

## 2022-06-26 DIAGNOSIS — R300 Dysuria: Secondary | ICD-10-CM | POA: Diagnosis not present

## 2022-07-09 DIAGNOSIS — C210 Malignant neoplasm of anus, unspecified: Secondary | ICD-10-CM | POA: Diagnosis not present

## 2022-07-11 DIAGNOSIS — C210 Malignant neoplasm of anus, unspecified: Secondary | ICD-10-CM | POA: Diagnosis not present

## 2022-07-27 DIAGNOSIS — R350 Frequency of micturition: Secondary | ICD-10-CM | POA: Diagnosis not present

## 2022-07-27 DIAGNOSIS — L918 Other hypertrophic disorders of the skin: Secondary | ICD-10-CM | POA: Diagnosis not present

## 2022-08-07 DIAGNOSIS — L918 Other hypertrophic disorders of the skin: Secondary | ICD-10-CM | POA: Diagnosis not present

## 2022-09-10 DIAGNOSIS — R350 Frequency of micturition: Secondary | ICD-10-CM | POA: Diagnosis not present

## 2022-09-19 DIAGNOSIS — C210 Malignant neoplasm of anus, unspecified: Secondary | ICD-10-CM | POA: Diagnosis not present

## 2022-10-08 DIAGNOSIS — C210 Malignant neoplasm of anus, unspecified: Secondary | ICD-10-CM | POA: Diagnosis not present

## 2022-10-09 DIAGNOSIS — Z23 Encounter for immunization: Secondary | ICD-10-CM | POA: Diagnosis not present

## 2022-10-12 DIAGNOSIS — R399 Unspecified symptoms and signs involving the genitourinary system: Secondary | ICD-10-CM | POA: Diagnosis not present

## 2022-10-12 DIAGNOSIS — C211 Malignant neoplasm of anal canal: Secondary | ICD-10-CM | POA: Diagnosis not present

## 2022-11-06 DIAGNOSIS — N390 Urinary tract infection, site not specified: Secondary | ICD-10-CM | POA: Diagnosis not present

## 2022-11-06 DIAGNOSIS — K2100 Gastro-esophageal reflux disease with esophagitis, without bleeding: Secondary | ICD-10-CM | POA: Diagnosis not present

## 2022-12-25 DIAGNOSIS — R634 Abnormal weight loss: Secondary | ICD-10-CM | POA: Diagnosis not present

## 2022-12-25 DIAGNOSIS — R590 Localized enlarged lymph nodes: Secondary | ICD-10-CM | POA: Diagnosis not present

## 2022-12-25 DIAGNOSIS — C210 Malignant neoplasm of anus, unspecified: Secondary | ICD-10-CM | POA: Diagnosis not present

## 2022-12-25 DIAGNOSIS — D649 Anemia, unspecified: Secondary | ICD-10-CM | POA: Diagnosis not present

## 2022-12-25 DIAGNOSIS — Z95828 Presence of other vascular implants and grafts: Secondary | ICD-10-CM | POA: Diagnosis not present

## 2022-12-26 DIAGNOSIS — C210 Malignant neoplasm of anus, unspecified: Secondary | ICD-10-CM | POA: Diagnosis not present

## 2022-12-26 DIAGNOSIS — Z1330 Encounter for screening examination for mental health and behavioral disorders, unspecified: Secondary | ICD-10-CM | POA: Diagnosis not present

## 2022-12-31 DIAGNOSIS — C210 Malignant neoplasm of anus, unspecified: Secondary | ICD-10-CM | POA: Diagnosis not present

## 2022-12-31 DIAGNOSIS — R590 Localized enlarged lymph nodes: Secondary | ICD-10-CM | POA: Diagnosis not present

## 2022-12-31 DIAGNOSIS — E278 Other specified disorders of adrenal gland: Secondary | ICD-10-CM | POA: Diagnosis not present

## 2022-12-31 DIAGNOSIS — K7689 Other specified diseases of liver: Secondary | ICD-10-CM | POA: Diagnosis not present

## 2023-02-19 DIAGNOSIS — C541 Malignant neoplasm of endometrium: Secondary | ICD-10-CM | POA: Diagnosis not present

## 2023-03-01 DIAGNOSIS — J40 Bronchitis, not specified as acute or chronic: Secondary | ICD-10-CM | POA: Diagnosis not present

## 2023-03-01 DIAGNOSIS — R300 Dysuria: Secondary | ICD-10-CM | POA: Diagnosis not present

## 2023-03-18 DIAGNOSIS — Z79899 Other long term (current) drug therapy: Secondary | ICD-10-CM | POA: Diagnosis not present

## 2023-03-18 DIAGNOSIS — S0083XA Contusion of other part of head, initial encounter: Secondary | ICD-10-CM | POA: Diagnosis not present

## 2023-03-18 DIAGNOSIS — R220 Localized swelling, mass and lump, head: Secondary | ICD-10-CM | POA: Diagnosis not present

## 2023-03-18 DIAGNOSIS — M25511 Pain in right shoulder: Secondary | ICD-10-CM | POA: Diagnosis not present

## 2023-03-18 DIAGNOSIS — S42201A Unspecified fracture of upper end of right humerus, initial encounter for closed fracture: Secondary | ICD-10-CM | POA: Diagnosis not present

## 2023-03-18 DIAGNOSIS — J449 Chronic obstructive pulmonary disease, unspecified: Secondary | ICD-10-CM | POA: Diagnosis not present

## 2023-03-18 DIAGNOSIS — S0990XA Unspecified injury of head, initial encounter: Secondary | ICD-10-CM | POA: Diagnosis not present

## 2023-03-18 DIAGNOSIS — W1812XA Fall from or off toilet with subsequent striking against object, initial encounter: Secondary | ICD-10-CM | POA: Diagnosis not present

## 2023-03-18 DIAGNOSIS — S42294A Other nondisplaced fracture of upper end of right humerus, initial encounter for closed fracture: Secondary | ICD-10-CM | POA: Diagnosis not present

## 2023-03-18 DIAGNOSIS — Z87891 Personal history of nicotine dependence: Secondary | ICD-10-CM | POA: Diagnosis not present

## 2023-04-02 DIAGNOSIS — M25511 Pain in right shoulder: Secondary | ICD-10-CM | POA: Diagnosis not present

## 2023-04-04 DIAGNOSIS — C210 Malignant neoplasm of anus, unspecified: Secondary | ICD-10-CM | POA: Diagnosis not present

## 2023-04-08 DIAGNOSIS — C539 Malignant neoplasm of cervix uteri, unspecified: Secondary | ICD-10-CM | POA: Diagnosis not present

## 2023-04-08 DIAGNOSIS — C210 Malignant neoplasm of anus, unspecified: Secondary | ICD-10-CM | POA: Diagnosis not present

## 2023-04-16 DIAGNOSIS — C541 Malignant neoplasm of endometrium: Secondary | ICD-10-CM | POA: Diagnosis not present

## 2023-04-16 DIAGNOSIS — C210 Malignant neoplasm of anus, unspecified: Secondary | ICD-10-CM | POA: Diagnosis not present

## 2023-05-07 DIAGNOSIS — S42201A Unspecified fracture of upper end of right humerus, initial encounter for closed fracture: Secondary | ICD-10-CM | POA: Diagnosis not present

## 2023-06-12 DIAGNOSIS — N281 Cyst of kidney, acquired: Secondary | ICD-10-CM | POA: Diagnosis not present

## 2023-06-12 DIAGNOSIS — C210 Malignant neoplasm of anus, unspecified: Secondary | ICD-10-CM | POA: Diagnosis not present

## 2023-06-12 DIAGNOSIS — K7689 Other specified diseases of liver: Secondary | ICD-10-CM | POA: Diagnosis not present

## 2023-06-12 DIAGNOSIS — R911 Solitary pulmonary nodule: Secondary | ICD-10-CM | POA: Diagnosis not present

## 2023-06-12 DIAGNOSIS — R590 Localized enlarged lymph nodes: Secondary | ICD-10-CM | POA: Diagnosis not present

## 2023-06-12 DIAGNOSIS — J439 Emphysema, unspecified: Secondary | ICD-10-CM | POA: Diagnosis not present

## 2023-06-12 DIAGNOSIS — E278 Other specified disorders of adrenal gland: Secondary | ICD-10-CM | POA: Diagnosis not present

## 2023-06-12 DIAGNOSIS — I2510 Atherosclerotic heart disease of native coronary artery without angina pectoris: Secondary | ICD-10-CM | POA: Diagnosis not present

## 2023-06-19 DIAGNOSIS — D539 Nutritional anemia, unspecified: Secondary | ICD-10-CM | POA: Diagnosis not present

## 2023-06-19 DIAGNOSIS — C541 Malignant neoplasm of endometrium: Secondary | ICD-10-CM | POA: Diagnosis not present

## 2023-06-19 DIAGNOSIS — C210 Malignant neoplasm of anus, unspecified: Secondary | ICD-10-CM | POA: Diagnosis not present

## 2023-06-19 DIAGNOSIS — Z8541 Personal history of malignant neoplasm of cervix uteri: Secondary | ICD-10-CM | POA: Diagnosis not present

## 2023-06-21 DIAGNOSIS — D649 Anemia, unspecified: Secondary | ICD-10-CM | POA: Diagnosis not present

## 2023-06-21 DIAGNOSIS — E559 Vitamin D deficiency, unspecified: Secondary | ICD-10-CM | POA: Diagnosis not present

## 2023-06-21 DIAGNOSIS — E782 Mixed hyperlipidemia: Secondary | ICD-10-CM | POA: Diagnosis not present

## 2023-06-21 DIAGNOSIS — M810 Age-related osteoporosis without current pathological fracture: Secondary | ICD-10-CM | POA: Diagnosis not present

## 2023-06-21 DIAGNOSIS — H409 Unspecified glaucoma: Secondary | ICD-10-CM | POA: Diagnosis not present

## 2023-06-21 DIAGNOSIS — K2100 Gastro-esophageal reflux disease with esophagitis, without bleeding: Secondary | ICD-10-CM | POA: Diagnosis not present

## 2023-06-21 DIAGNOSIS — I10 Essential (primary) hypertension: Secondary | ICD-10-CM | POA: Diagnosis not present

## 2023-06-21 DIAGNOSIS — C210 Malignant neoplasm of anus, unspecified: Secondary | ICD-10-CM | POA: Diagnosis not present

## 2023-06-21 DIAGNOSIS — I639 Cerebral infarction, unspecified: Secondary | ICD-10-CM | POA: Diagnosis not present

## 2023-06-21 DIAGNOSIS — Z23 Encounter for immunization: Secondary | ICD-10-CM | POA: Diagnosis not present

## 2023-06-21 DIAGNOSIS — Z0000 Encounter for general adult medical examination without abnormal findings: Secondary | ICD-10-CM | POA: Diagnosis not present

## 2023-08-07 DIAGNOSIS — Z452 Encounter for adjustment and management of vascular access device: Secondary | ICD-10-CM | POA: Diagnosis not present

## 2023-08-07 DIAGNOSIS — C210 Malignant neoplasm of anus, unspecified: Secondary | ICD-10-CM | POA: Diagnosis not present

## 2023-08-20 DIAGNOSIS — D709 Neutropenia, unspecified: Secondary | ICD-10-CM | POA: Diagnosis not present

## 2023-08-28 DIAGNOSIS — D649 Anemia, unspecified: Secondary | ICD-10-CM | POA: Diagnosis not present

## 2023-09-11 DIAGNOSIS — C210 Malignant neoplasm of anus, unspecified: Secondary | ICD-10-CM | POA: Diagnosis not present

## 2023-11-13 DIAGNOSIS — D649 Anemia, unspecified: Secondary | ICD-10-CM | POA: Diagnosis not present

## 2023-12-24 DIAGNOSIS — M130 Polyarthritis, unspecified: Secondary | ICD-10-CM | POA: Diagnosis not present

## 2023-12-24 DIAGNOSIS — R300 Dysuria: Secondary | ICD-10-CM | POA: Diagnosis not present

## 2024-01-08 DIAGNOSIS — R300 Dysuria: Secondary | ICD-10-CM | POA: Diagnosis not present

## 2024-01-08 DIAGNOSIS — D6489 Other specified anemias: Secondary | ICD-10-CM | POA: Diagnosis not present

## 2024-01-23 DIAGNOSIS — C210 Malignant neoplasm of anus, unspecified: Secondary | ICD-10-CM | POA: Diagnosis not present

## 2024-01-23 DIAGNOSIS — C541 Malignant neoplasm of endometrium: Secondary | ICD-10-CM | POA: Diagnosis not present

## 2024-02-15 DIAGNOSIS — I10 Essential (primary) hypertension: Secondary | ICD-10-CM | POA: Diagnosis not present

## 2024-02-15 DIAGNOSIS — I639 Cerebral infarction, unspecified: Secondary | ICD-10-CM | POA: Diagnosis not present

## 2024-02-15 DIAGNOSIS — J40 Bronchitis, not specified as acute or chronic: Secondary | ICD-10-CM | POA: Diagnosis not present

## 2024-02-24 DIAGNOSIS — I10 Essential (primary) hypertension: Secondary | ICD-10-CM | POA: Diagnosis not present

## 2024-02-24 DIAGNOSIS — M810 Age-related osteoporosis without current pathological fracture: Secondary | ICD-10-CM | POA: Diagnosis not present

## 2024-02-24 DIAGNOSIS — J40 Bronchitis, not specified as acute or chronic: Secondary | ICD-10-CM | POA: Diagnosis not present

## 2024-02-24 DIAGNOSIS — E782 Mixed hyperlipidemia: Secondary | ICD-10-CM | POA: Diagnosis not present

## 2024-02-24 DIAGNOSIS — I639 Cerebral infarction, unspecified: Secondary | ICD-10-CM | POA: Diagnosis not present

## 2024-03-02 DIAGNOSIS — L0390 Cellulitis, unspecified: Secondary | ICD-10-CM | POA: Diagnosis not present

## 2024-03-02 DIAGNOSIS — R300 Dysuria: Secondary | ICD-10-CM | POA: Diagnosis not present

## 2024-03-02 DIAGNOSIS — J40 Bronchitis, not specified as acute or chronic: Secondary | ICD-10-CM | POA: Diagnosis not present

## 2024-03-15 DIAGNOSIS — I639 Cerebral infarction, unspecified: Secondary | ICD-10-CM | POA: Diagnosis not present

## 2024-03-15 DIAGNOSIS — I10 Essential (primary) hypertension: Secondary | ICD-10-CM | POA: Diagnosis not present

## 2024-03-15 DIAGNOSIS — J40 Bronchitis, not specified as acute or chronic: Secondary | ICD-10-CM | POA: Diagnosis not present

## 2024-03-25 DIAGNOSIS — E782 Mixed hyperlipidemia: Secondary | ICD-10-CM | POA: Diagnosis not present

## 2024-03-25 DIAGNOSIS — I639 Cerebral infarction, unspecified: Secondary | ICD-10-CM | POA: Diagnosis not present

## 2024-03-25 DIAGNOSIS — J40 Bronchitis, not specified as acute or chronic: Secondary | ICD-10-CM | POA: Diagnosis not present

## 2024-03-25 DIAGNOSIS — I10 Essential (primary) hypertension: Secondary | ICD-10-CM | POA: Diagnosis not present

## 2024-03-25 DIAGNOSIS — M810 Age-related osteoporosis without current pathological fracture: Secondary | ICD-10-CM | POA: Diagnosis not present

## 2024-04-14 DIAGNOSIS — I639 Cerebral infarction, unspecified: Secondary | ICD-10-CM | POA: Diagnosis not present

## 2024-04-14 DIAGNOSIS — J40 Bronchitis, not specified as acute or chronic: Secondary | ICD-10-CM | POA: Diagnosis not present

## 2024-04-14 DIAGNOSIS — I10 Essential (primary) hypertension: Secondary | ICD-10-CM | POA: Diagnosis not present

## 2024-04-25 DIAGNOSIS — I10 Essential (primary) hypertension: Secondary | ICD-10-CM | POA: Diagnosis not present

## 2024-04-25 DIAGNOSIS — M810 Age-related osteoporosis without current pathological fracture: Secondary | ICD-10-CM | POA: Diagnosis not present

## 2024-04-25 DIAGNOSIS — E782 Mixed hyperlipidemia: Secondary | ICD-10-CM | POA: Diagnosis not present

## 2024-04-25 DIAGNOSIS — I639 Cerebral infarction, unspecified: Secondary | ICD-10-CM | POA: Diagnosis not present

## 2024-05-14 DIAGNOSIS — I639 Cerebral infarction, unspecified: Secondary | ICD-10-CM | POA: Diagnosis not present

## 2024-05-14 DIAGNOSIS — J40 Bronchitis, not specified as acute or chronic: Secondary | ICD-10-CM | POA: Diagnosis not present

## 2024-05-14 DIAGNOSIS — I10 Essential (primary) hypertension: Secondary | ICD-10-CM | POA: Diagnosis not present

## 2024-05-25 DIAGNOSIS — I639 Cerebral infarction, unspecified: Secondary | ICD-10-CM | POA: Diagnosis not present

## 2024-05-25 DIAGNOSIS — I10 Essential (primary) hypertension: Secondary | ICD-10-CM | POA: Diagnosis not present

## 2024-05-25 DIAGNOSIS — J40 Bronchitis, not specified as acute or chronic: Secondary | ICD-10-CM | POA: Diagnosis not present

## 2024-06-13 DIAGNOSIS — I10 Essential (primary) hypertension: Secondary | ICD-10-CM | POA: Diagnosis not present

## 2024-06-13 DIAGNOSIS — I639 Cerebral infarction, unspecified: Secondary | ICD-10-CM | POA: Diagnosis not present

## 2024-06-13 DIAGNOSIS — J40 Bronchitis, not specified as acute or chronic: Secondary | ICD-10-CM | POA: Diagnosis not present

## 2024-06-15 DIAGNOSIS — E278 Other specified disorders of adrenal gland: Secondary | ICD-10-CM | POA: Diagnosis not present

## 2024-06-15 DIAGNOSIS — I2510 Atherosclerotic heart disease of native coronary artery without angina pectoris: Secondary | ICD-10-CM | POA: Diagnosis not present

## 2024-06-15 DIAGNOSIS — K7689 Other specified diseases of liver: Secondary | ICD-10-CM | POA: Diagnosis not present

## 2024-06-15 DIAGNOSIS — K5730 Diverticulosis of large intestine without perforation or abscess without bleeding: Secondary | ICD-10-CM | POA: Diagnosis not present

## 2024-06-15 DIAGNOSIS — J432 Centrilobular emphysema: Secondary | ICD-10-CM | POA: Diagnosis not present

## 2024-06-15 DIAGNOSIS — C210 Malignant neoplasm of anus, unspecified: Secondary | ICD-10-CM | POA: Diagnosis not present

## 2024-06-15 DIAGNOSIS — R911 Solitary pulmonary nodule: Secondary | ICD-10-CM | POA: Diagnosis not present

## 2024-06-23 DIAGNOSIS — Z0000 Encounter for general adult medical examination without abnormal findings: Secondary | ICD-10-CM | POA: Diagnosis not present

## 2024-06-23 DIAGNOSIS — C20 Malignant neoplasm of rectum: Secondary | ICD-10-CM | POA: Diagnosis not present

## 2024-06-23 DIAGNOSIS — I679 Cerebrovascular disease, unspecified: Secondary | ICD-10-CM | POA: Diagnosis not present

## 2024-06-23 DIAGNOSIS — E559 Vitamin D deficiency, unspecified: Secondary | ICD-10-CM | POA: Diagnosis not present

## 2024-06-23 DIAGNOSIS — E611 Iron deficiency: Secondary | ICD-10-CM | POA: Diagnosis not present

## 2024-06-23 DIAGNOSIS — M810 Age-related osteoporosis without current pathological fracture: Secondary | ICD-10-CM | POA: Diagnosis not present

## 2024-06-23 DIAGNOSIS — Z1331 Encounter for screening for depression: Secondary | ICD-10-CM | POA: Diagnosis not present

## 2024-06-23 DIAGNOSIS — Z6826 Body mass index (BMI) 26.0-26.9, adult: Secondary | ICD-10-CM | POA: Diagnosis not present

## 2024-06-23 DIAGNOSIS — I10 Essential (primary) hypertension: Secondary | ICD-10-CM | POA: Diagnosis not present

## 2024-06-23 DIAGNOSIS — E782 Mixed hyperlipidemia: Secondary | ICD-10-CM | POA: Diagnosis not present

## 2024-06-25 DIAGNOSIS — I639 Cerebral infarction, unspecified: Secondary | ICD-10-CM | POA: Diagnosis not present

## 2024-06-25 DIAGNOSIS — J40 Bronchitis, not specified as acute or chronic: Secondary | ICD-10-CM | POA: Diagnosis not present

## 2024-06-25 DIAGNOSIS — M810 Age-related osteoporosis without current pathological fracture: Secondary | ICD-10-CM | POA: Diagnosis not present

## 2024-06-25 DIAGNOSIS — I10 Essential (primary) hypertension: Secondary | ICD-10-CM | POA: Diagnosis not present

## 2024-06-25 DIAGNOSIS — E782 Mixed hyperlipidemia: Secondary | ICD-10-CM | POA: Diagnosis not present

## 2024-07-13 DIAGNOSIS — J40 Bronchitis, not specified as acute or chronic: Secondary | ICD-10-CM | POA: Diagnosis not present

## 2024-07-13 DIAGNOSIS — I10 Essential (primary) hypertension: Secondary | ICD-10-CM | POA: Diagnosis not present

## 2024-07-13 DIAGNOSIS — I639 Cerebral infarction, unspecified: Secondary | ICD-10-CM | POA: Diagnosis not present

## 2024-07-21 DIAGNOSIS — M7989 Other specified soft tissue disorders: Secondary | ICD-10-CM | POA: Diagnosis not present

## 2024-07-21 DIAGNOSIS — L03818 Cellulitis of other sites: Secondary | ICD-10-CM | POA: Diagnosis not present

## 2024-07-21 DIAGNOSIS — C210 Malignant neoplasm of anus, unspecified: Secondary | ICD-10-CM | POA: Diagnosis not present

## 2024-07-21 DIAGNOSIS — D649 Anemia, unspecified: Secondary | ICD-10-CM | POA: Diagnosis not present

## 2024-07-21 DIAGNOSIS — D539 Nutritional anemia, unspecified: Secondary | ICD-10-CM | POA: Diagnosis not present

## 2024-07-23 DIAGNOSIS — M7989 Other specified soft tissue disorders: Secondary | ICD-10-CM | POA: Diagnosis not present

## 2024-07-26 DIAGNOSIS — J40 Bronchitis, not specified as acute or chronic: Secondary | ICD-10-CM | POA: Diagnosis not present

## 2024-07-26 DIAGNOSIS — I639 Cerebral infarction, unspecified: Secondary | ICD-10-CM | POA: Diagnosis not present

## 2024-07-26 DIAGNOSIS — M810 Age-related osteoporosis without current pathological fracture: Secondary | ICD-10-CM | POA: Diagnosis not present

## 2024-07-26 DIAGNOSIS — I10 Essential (primary) hypertension: Secondary | ICD-10-CM | POA: Diagnosis not present

## 2024-07-26 DIAGNOSIS — E782 Mixed hyperlipidemia: Secondary | ICD-10-CM | POA: Diagnosis not present

## 2024-08-04 DIAGNOSIS — E559 Vitamin D deficiency, unspecified: Secondary | ICD-10-CM | POA: Diagnosis not present

## 2024-08-04 DIAGNOSIS — L03115 Cellulitis of right lower limb: Secondary | ICD-10-CM | POA: Diagnosis not present

## 2024-08-04 DIAGNOSIS — M810 Age-related osteoporosis without current pathological fracture: Secondary | ICD-10-CM | POA: Diagnosis not present

## 2024-08-12 DIAGNOSIS — I639 Cerebral infarction, unspecified: Secondary | ICD-10-CM | POA: Diagnosis not present

## 2024-08-12 DIAGNOSIS — J40 Bronchitis, not specified as acute or chronic: Secondary | ICD-10-CM | POA: Diagnosis not present

## 2024-08-12 DIAGNOSIS — I10 Essential (primary) hypertension: Secondary | ICD-10-CM | POA: Diagnosis not present

## 2024-08-18 DIAGNOSIS — C211 Malignant neoplasm of anal canal: Secondary | ICD-10-CM | POA: Diagnosis not present

## 2024-08-25 DIAGNOSIS — M810 Age-related osteoporosis without current pathological fracture: Secondary | ICD-10-CM | POA: Diagnosis not present

## 2024-08-25 DIAGNOSIS — E782 Mixed hyperlipidemia: Secondary | ICD-10-CM | POA: Diagnosis not present

## 2024-08-25 DIAGNOSIS — I10 Essential (primary) hypertension: Secondary | ICD-10-CM | POA: Diagnosis not present

## 2024-08-25 DIAGNOSIS — I639 Cerebral infarction, unspecified: Secondary | ICD-10-CM | POA: Diagnosis not present

## 2024-08-25 DIAGNOSIS — J40 Bronchitis, not specified as acute or chronic: Secondary | ICD-10-CM | POA: Diagnosis not present

## 2024-09-11 DIAGNOSIS — I10 Essential (primary) hypertension: Secondary | ICD-10-CM | POA: Diagnosis not present

## 2024-09-11 DIAGNOSIS — J40 Bronchitis, not specified as acute or chronic: Secondary | ICD-10-CM | POA: Diagnosis not present

## 2024-09-11 DIAGNOSIS — I639 Cerebral infarction, unspecified: Secondary | ICD-10-CM | POA: Diagnosis not present

## 2024-09-21 DIAGNOSIS — M25511 Pain in right shoulder: Secondary | ICD-10-CM | POA: Diagnosis not present

## 2024-09-25 DIAGNOSIS — M810 Age-related osteoporosis without current pathological fracture: Secondary | ICD-10-CM | POA: Diagnosis not present

## 2024-09-25 DIAGNOSIS — E782 Mixed hyperlipidemia: Secondary | ICD-10-CM | POA: Diagnosis not present

## 2024-09-25 DIAGNOSIS — I10 Essential (primary) hypertension: Secondary | ICD-10-CM | POA: Diagnosis not present

## 2024-09-25 DIAGNOSIS — I639 Cerebral infarction, unspecified: Secondary | ICD-10-CM | POA: Diagnosis not present

## 2024-10-11 DIAGNOSIS — J40 Bronchitis, not specified as acute or chronic: Secondary | ICD-10-CM | POA: Diagnosis not present

## 2024-10-11 DIAGNOSIS — I639 Cerebral infarction, unspecified: Secondary | ICD-10-CM | POA: Diagnosis not present

## 2024-10-11 DIAGNOSIS — I10 Essential (primary) hypertension: Secondary | ICD-10-CM | POA: Diagnosis not present

## 2024-10-15 DIAGNOSIS — S2241XA Multiple fractures of ribs, right side, initial encounter for closed fracture: Secondary | ICD-10-CM | POA: Diagnosis not present

## 2024-10-15 DIAGNOSIS — M25511 Pain in right shoulder: Secondary | ICD-10-CM | POA: Diagnosis not present

## 2024-10-15 DIAGNOSIS — S4981XD Other specified injuries of right shoulder and upper arm, subsequent encounter: Secondary | ICD-10-CM | POA: Diagnosis not present

## 2024-10-30 DIAGNOSIS — N390 Urinary tract infection, site not specified: Secondary | ICD-10-CM | POA: Diagnosis not present

## 2024-10-30 DIAGNOSIS — Z6826 Body mass index (BMI) 26.0-26.9, adult: Secondary | ICD-10-CM | POA: Diagnosis not present

## 2024-10-30 DIAGNOSIS — R296 Repeated falls: Secondary | ICD-10-CM | POA: Diagnosis not present
